# Patient Record
Sex: Female | Born: 1950 | Race: White | Hispanic: No | Marital: Married | Smoking: Former smoker
Health system: Southern US, Community
[De-identification: ages and names within clinical notes are randomized; demographics above are authoritative.]

## PROBLEM LIST (undated history)

## (undated) DIAGNOSIS — I4719 Other supraventricular tachycardia: Secondary | ICD-10-CM

## (undated) DIAGNOSIS — R0789 Other chest pain: Secondary | ICD-10-CM

## (undated) DIAGNOSIS — M8580 Other specified disorders of bone density and structure, unspecified site: Secondary | ICD-10-CM

## (undated) DIAGNOSIS — I480 Paroxysmal atrial fibrillation: Secondary | ICD-10-CM

## (undated) DIAGNOSIS — I447 Left bundle-branch block, unspecified: Secondary | ICD-10-CM

## (undated) DIAGNOSIS — I471 Supraventricular tachycardia: Secondary | ICD-10-CM

## (undated) DIAGNOSIS — B009 Herpesviral infection, unspecified: Secondary | ICD-10-CM

## (undated) DIAGNOSIS — E7800 Pure hypercholesterolemia, unspecified: Secondary | ICD-10-CM

## (undated) DIAGNOSIS — Z973 Presence of spectacles and contact lenses: Secondary | ICD-10-CM

## (undated) DIAGNOSIS — I10 Essential (primary) hypertension: Secondary | ICD-10-CM

## (undated) DIAGNOSIS — M1990 Unspecified osteoarthritis, unspecified site: Secondary | ICD-10-CM

## (undated) DIAGNOSIS — Z950 Presence of cardiac pacemaker: Secondary | ICD-10-CM

## (undated) HISTORY — DX: Pure hypercholesterolemia, unspecified: E78.00

## (undated) HISTORY — DX: Paroxysmal atrial fibrillation: I48.0

## (undated) HISTORY — DX: Presence of cardiac pacemaker: Z95.0

## (undated) HISTORY — DX: Left bundle-branch block, unspecified: I44.7

## (undated) HISTORY — PX: COLONOSCOPY: SHX174

## (undated) HISTORY — DX: Other supraventricular tachycardia: I47.19

## (undated) HISTORY — DX: Other specified disorders of bone density and structure, unspecified site: M85.80

## (undated) HISTORY — DX: Herpesviral infection, unspecified: B00.9

## (undated) HISTORY — DX: Other chest pain: R07.89

## (undated) HISTORY — DX: Supraventricular tachycardia: I47.1

---

## 1984-06-21 HISTORY — PX: TUBAL LIGATION: SHX77

## 1999-04-02 ENCOUNTER — Other Ambulatory Visit: Admission: RE | Admit: 1999-04-02 | Discharge: 1999-04-02 | Payer: Self-pay | Admitting: Obstetrics and Gynecology

## 2000-04-14 ENCOUNTER — Other Ambulatory Visit: Admission: RE | Admit: 2000-04-14 | Discharge: 2000-04-14 | Payer: Self-pay | Admitting: Obstetrics and Gynecology

## 2000-12-27 ENCOUNTER — Encounter: Payer: Self-pay | Admitting: Obstetrics and Gynecology

## 2000-12-27 ENCOUNTER — Ambulatory Visit (HOSPITAL_COMMUNITY): Admission: RE | Admit: 2000-12-27 | Discharge: 2000-12-27 | Payer: Self-pay | Admitting: Obstetrics and Gynecology

## 2001-05-31 ENCOUNTER — Other Ambulatory Visit: Admission: RE | Admit: 2001-05-31 | Discharge: 2001-05-31 | Payer: Self-pay | Admitting: Obstetrics and Gynecology

## 2002-01-03 ENCOUNTER — Ambulatory Visit (HOSPITAL_COMMUNITY): Admission: RE | Admit: 2002-01-03 | Discharge: 2002-01-03 | Payer: Self-pay | Admitting: Obstetrics and Gynecology

## 2002-01-03 ENCOUNTER — Encounter: Payer: Self-pay | Admitting: Obstetrics and Gynecology

## 2002-02-26 ENCOUNTER — Ambulatory Visit (HOSPITAL_COMMUNITY): Admission: RE | Admit: 2002-02-26 | Discharge: 2002-02-26 | Payer: Self-pay | Admitting: Internal Medicine

## 2002-06-05 ENCOUNTER — Other Ambulatory Visit: Admission: RE | Admit: 2002-06-05 | Discharge: 2002-06-05 | Payer: Self-pay | Admitting: Obstetrics and Gynecology

## 2003-01-09 ENCOUNTER — Ambulatory Visit (HOSPITAL_COMMUNITY): Admission: RE | Admit: 2003-01-09 | Discharge: 2003-01-09 | Payer: Self-pay | Admitting: Internal Medicine

## 2003-01-09 ENCOUNTER — Encounter: Payer: Self-pay | Admitting: Internal Medicine

## 2003-06-10 ENCOUNTER — Other Ambulatory Visit: Admission: RE | Admit: 2003-06-10 | Discharge: 2003-06-10 | Payer: Self-pay | Admitting: Obstetrics and Gynecology

## 2003-06-22 HISTORY — PX: CARDIAC CATHETERIZATION: SHX172

## 2003-08-21 ENCOUNTER — Ambulatory Visit (HOSPITAL_COMMUNITY): Admission: RE | Admit: 2003-08-21 | Discharge: 2003-08-21 | Payer: Self-pay | Admitting: Family Medicine

## 2003-08-27 ENCOUNTER — Ambulatory Visit (HOSPITAL_COMMUNITY): Admission: RE | Admit: 2003-08-27 | Discharge: 2003-08-27 | Payer: Self-pay | Admitting: Family Medicine

## 2003-08-27 HISTORY — PX: OTHER SURGICAL HISTORY: SHX169

## 2003-10-08 ENCOUNTER — Inpatient Hospital Stay (HOSPITAL_BASED_OUTPATIENT_CLINIC_OR_DEPARTMENT_OTHER): Admission: RE | Admit: 2003-10-08 | Discharge: 2003-10-08 | Payer: Self-pay | Admitting: *Deleted

## 2003-10-23 ENCOUNTER — Ambulatory Visit (HOSPITAL_COMMUNITY): Admission: RE | Admit: 2003-10-23 | Discharge: 2003-10-23 | Payer: Self-pay | Admitting: *Deleted

## 2004-01-15 ENCOUNTER — Ambulatory Visit (HOSPITAL_COMMUNITY): Admission: RE | Admit: 2004-01-15 | Discharge: 2004-01-15 | Payer: Self-pay | Admitting: Obstetrics and Gynecology

## 2004-06-11 ENCOUNTER — Other Ambulatory Visit: Admission: RE | Admit: 2004-06-11 | Discharge: 2004-06-11 | Payer: Self-pay | Admitting: Obstetrics and Gynecology

## 2004-09-24 ENCOUNTER — Ambulatory Visit (HOSPITAL_COMMUNITY): Admission: RE | Admit: 2004-09-24 | Discharge: 2004-09-24 | Payer: Self-pay | Admitting: Family Medicine

## 2005-01-27 ENCOUNTER — Ambulatory Visit (HOSPITAL_COMMUNITY): Admission: RE | Admit: 2005-01-27 | Discharge: 2005-01-27 | Payer: Self-pay | Admitting: Obstetrics and Gynecology

## 2005-06-16 ENCOUNTER — Other Ambulatory Visit: Admission: RE | Admit: 2005-06-16 | Discharge: 2005-06-16 | Payer: Self-pay | Admitting: Obstetrics and Gynecology

## 2006-01-31 ENCOUNTER — Ambulatory Visit (HOSPITAL_COMMUNITY): Admission: RE | Admit: 2006-01-31 | Discharge: 2006-01-31 | Payer: Self-pay | Admitting: Obstetrics and Gynecology

## 2006-06-17 ENCOUNTER — Other Ambulatory Visit: Admission: RE | Admit: 2006-06-17 | Discharge: 2006-06-17 | Payer: Self-pay | Admitting: Obstetrics and Gynecology

## 2006-09-14 ENCOUNTER — Ambulatory Visit (HOSPITAL_COMMUNITY): Admission: RE | Admit: 2006-09-14 | Discharge: 2006-09-14 | Payer: Self-pay | Admitting: Family Medicine

## 2006-12-27 HISTORY — PX: OTHER SURGICAL HISTORY: SHX169

## 2007-02-06 ENCOUNTER — Ambulatory Visit (HOSPITAL_COMMUNITY): Admission: RE | Admit: 2007-02-06 | Discharge: 2007-02-06 | Payer: Self-pay | Admitting: Obstetrics and Gynecology

## 2007-06-19 ENCOUNTER — Other Ambulatory Visit: Admission: RE | Admit: 2007-06-19 | Discharge: 2007-06-19 | Payer: Self-pay | Admitting: Obstetrics and Gynecology

## 2008-02-07 ENCOUNTER — Ambulatory Visit (HOSPITAL_COMMUNITY): Admission: RE | Admit: 2008-02-07 | Discharge: 2008-02-07 | Payer: Self-pay | Admitting: Obstetrics and Gynecology

## 2008-06-25 ENCOUNTER — Ambulatory Visit: Payer: Self-pay | Admitting: Obstetrics and Gynecology

## 2008-06-25 ENCOUNTER — Other Ambulatory Visit: Admission: RE | Admit: 2008-06-25 | Discharge: 2008-06-25 | Payer: Self-pay | Admitting: Obstetrics and Gynecology

## 2008-06-25 ENCOUNTER — Encounter: Payer: Self-pay | Admitting: Obstetrics and Gynecology

## 2009-02-10 ENCOUNTER — Ambulatory Visit (HOSPITAL_COMMUNITY): Admission: RE | Admit: 2009-02-10 | Discharge: 2009-02-10 | Payer: Self-pay | Admitting: Family Medicine

## 2009-07-04 ENCOUNTER — Other Ambulatory Visit: Admission: RE | Admit: 2009-07-04 | Discharge: 2009-07-04 | Payer: Self-pay | Admitting: Obstetrics and Gynecology

## 2009-07-04 ENCOUNTER — Ambulatory Visit: Payer: Self-pay | Admitting: Obstetrics and Gynecology

## 2009-07-11 ENCOUNTER — Ambulatory Visit: Payer: Self-pay | Admitting: Obstetrics and Gynecology

## 2009-07-22 HISTORY — PX: PACEMAKER INSERTION: SHX728

## 2009-07-28 ENCOUNTER — Ambulatory Visit (HOSPITAL_COMMUNITY): Admission: RE | Admit: 2009-07-28 | Discharge: 2009-07-28 | Payer: Self-pay | Admitting: Cardiovascular Disease

## 2009-07-29 ENCOUNTER — Ambulatory Visit (HOSPITAL_COMMUNITY): Admission: RE | Admit: 2009-07-29 | Discharge: 2009-07-30 | Payer: Self-pay | Admitting: Cardiology

## 2009-07-29 HISTORY — PX: INSERT / REPLACE / REMOVE PACEMAKER: SUR710

## 2009-12-30 ENCOUNTER — Ambulatory Visit (HOSPITAL_COMMUNITY): Admission: RE | Admit: 2009-12-30 | Discharge: 2009-12-30 | Payer: Self-pay | Admitting: Family Medicine

## 2010-02-17 ENCOUNTER — Ambulatory Visit (HOSPITAL_COMMUNITY): Admission: RE | Admit: 2010-02-17 | Discharge: 2010-02-17 | Payer: Self-pay | Admitting: Obstetrics and Gynecology

## 2010-09-08 ENCOUNTER — Encounter (INDEPENDENT_AMBULATORY_CARE_PROVIDER_SITE_OTHER): Payer: Commercial Indemnity | Admitting: Obstetrics and Gynecology

## 2010-09-08 ENCOUNTER — Other Ambulatory Visit: Payer: Self-pay | Admitting: Obstetrics and Gynecology

## 2010-09-08 ENCOUNTER — Other Ambulatory Visit (HOSPITAL_COMMUNITY)
Admission: RE | Admit: 2010-09-08 | Discharge: 2010-09-08 | Disposition: A | Discharge status: Home / Self Care | Payer: Commercial Indemnity | Source: Ambulatory Visit | Attending: Obstetrics and Gynecology | Admitting: Obstetrics and Gynecology

## 2010-09-08 DIAGNOSIS — Z124 Encounter for screening for malignant neoplasm of cervix: Secondary | ICD-10-CM | POA: Insufficient documentation

## 2010-09-08 DIAGNOSIS — Z01419 Encounter for gynecological examination (general) (routine) without abnormal findings: Secondary | ICD-10-CM

## 2010-11-06 NOTE — Cardiovascular Report (Signed)
NAME:  [name], [name]                           ACCOUNT NO.:  [id number]   MEDICAL RECORD NO.:  [id number]                   PATIENT TYPE:  OIB   LOCATION:  6501                                 FACILITY:  [facility]   PHYSICIAN:  Carole Binning, M.D. [facility]         DATE OF BIRTH:  [birth year]   DATE OF PROCEDURE:  10/08/2003  DATE OF DISCHARGE:  10/08/2003                              CARDIAC CATHETERIZATION   PROCEDURE PERFORMED:  Left heart catheterization with coronary angiography  and left ventriculography.   INDICATION:  [name] is a 60 year old woman with symptoms of chest pain  and palpitations.  She was recently evaluated by Dr. Dorethea Clan and was found to  have a new left bundle branch block on her EKG which had not been present on  her recent EKG.  Because of the development of new left bundle branch block  in the presence of chest pain, she was referred for cardiac catheterization  to rule out coronary artery disease.   CATHETERIZATION PROCEDURAL NOTE:  We initially placed a 4 French sheath in  the right femoral artery.  Left coronary angiography was performed with a 6  French JL-3.5 catheter.  The right coronary artery was a small nondominant  vessel with a high anterior takeoff.  Multiple 4 French catheters were  attempted to cannulate this artery.  However, they were unsuccessful.  We  therefore upsized to a 5 [place] sheath and we utilized a 5 [place] AL-1  catheter.  This did allow [place] to get close enough to the origin of the artery  to visualize this with contrast injection.  We then performed left  ventriculography with 5 French angled pigtail catheter.  Contrast was  Omnipaque.  There were no complications.   CATHETERIZATION RESULTS:   HEMODYNAMICS:  1. Left ventricular pressure 166/10.  2. Aortic pressure 150/78.  3. There is no aortic valve gradient.   LEFT VENTRICULOGRAM:  Wall motion is normal.  Ejection fraction is estimated  at greater than or equal to 60%.   CORONARY ARTERIOGRAPHY (LEFT DOMINANT):  Left main is normal.   Left anterior descending artery gives rise to a small first diagonal and a  normal size second diagonal.  The LAD is normal.  The distal LAD tapers to a  very small vessel.   Left circumflex is a large dominant vessel.  Circumflex gives rise to a  normal size ramus intermedius and a very large branching obtuse marginal  which supplies the entire lateral wall.  The distal circumflex ends as a  small posterior descending artery.  The left circumflex is normal.   Right coronary artery is a small nondominant vessel.  Nonselective  injections of the right coronary artery appeared to be angiographically  normal.   IMPRESSION:  1. Normal left ventricular systolic function.  2. No significant coronary artery disease identified.  Carole Binning, M.D. [facility]    MWP/MEDQ  D:  10/08/2003  T:  10/09/2003  Job:  [id number]   cc:   Vida Roller, M.D.  Fax: [phone]   Corrie Mckusick, M.D.  [address] [place]. [place]  [place]  [place] [address]  Fax: [phone]

## 2010-11-06 NOTE — Procedures (Signed)
NAME:  [name], [name]                           ACCOUNT NO.:  [id number]   MEDICAL RECORD NO.:  [id number]                   PATIENT TYPE:  OUT   LOCATION:  RAD                                  FACILITY:  [facility]   PHYSICIAN:  Vida Roller, M.D.                DATE OF BIRTH:  [birth year]   DATE OF PROCEDURE:  DATE OF DISCHARGE:                                  ECHOCARDIOGRAM   TAPE NUMBER:  LB 525, tape count 1971 through 2345.   INDICATION:  A 60 year old woman with chest pain.  Assessment for systolic  function.   TECHNICAL QUALITY:  Adequate.   2-D MEASUREMENTS:  1. The aorta is 26 mm.  2. The left atrium is 30 mm.  3. The septum is 11 mm.  4. The posterior wall is 11 mm.  5. The left ventricular systolic dimension is 39 mm.  6. Left ventricular systolic dimension is 29 mm.   2-D AND DOPPLER IMAGING:  1. The left ventricle is normal size with normal systolic function.  There     are no wall motion abnormalities seen.  Overall ejection fraction is     estimated at 55-60%.  2. The right ventricle is normal size with normal systolic function.  3. Both atria appear to be normal size.  There is no obvious atrial septal     defect.  4. The aortic valve is trileaflet, tricommisure with no evidence of stenosis     or regurgitation.  5. The mitral valve is morphologically unremarkable with trivial     insufficiency.  No stenosis is seen.  6. The tricuspid valve is morphologically unremarkable with mild     insufficiency.  No stenosis is seen.  7. The pulmonic valve is not well seen.  8. The ascending aorta is not well seen.  9. The inferior vena cava is normal size.  10.      The pericardial structures appear normal.      ___________________________________________                                            Vida Roller, M.D.   JH/MEDQ  D:  10/23/2003  T:  10/23/2003  Job:  [id number]

## 2011-02-04 ENCOUNTER — Other Ambulatory Visit: Payer: Self-pay | Admitting: Obstetrics and Gynecology

## 2011-02-04 DIAGNOSIS — Z139 Encounter for screening, unspecified: Secondary | ICD-10-CM

## 2011-02-25 ENCOUNTER — Ambulatory Visit (HOSPITAL_COMMUNITY)
Admission: RE | Admit: 2011-02-25 | Discharge: 2011-02-25 | Disposition: A | Discharge status: Home / Self Care | Payer: Commercial Indemnity | Source: Ambulatory Visit | Attending: Obstetrics and Gynecology | Admitting: Obstetrics and Gynecology

## 2011-02-25 DIAGNOSIS — Z139 Encounter for screening, unspecified: Secondary | ICD-10-CM

## 2011-02-25 DIAGNOSIS — Z1231 Encounter for screening mammogram for malignant neoplasm of breast: Secondary | ICD-10-CM | POA: Insufficient documentation

## 2011-04-28 ENCOUNTER — Encounter (HOSPITAL_BASED_OUTPATIENT_CLINIC_OR_DEPARTMENT_OTHER): Payer: Self-pay | Admitting: *Deleted

## 2011-04-29 ENCOUNTER — Other Ambulatory Visit: Payer: Self-pay | Admitting: Orthopedic Surgery

## 2011-04-30 ENCOUNTER — Encounter (HOSPITAL_BASED_OUTPATIENT_CLINIC_OR_DEPARTMENT_OTHER): Payer: Self-pay | Admitting: Anesthesiology

## 2011-04-30 ENCOUNTER — Encounter (HOSPITAL_BASED_OUTPATIENT_CLINIC_OR_DEPARTMENT_OTHER): Payer: Self-pay | Admitting: Orthopedic Surgery

## 2011-04-30 ENCOUNTER — Encounter (HOSPITAL_BASED_OUTPATIENT_CLINIC_OR_DEPARTMENT_OTHER)
Admission: RE | Disposition: A | Discharge status: Home / Self Care | Payer: Self-pay | Source: Ambulatory Visit | Attending: Orthopedic Surgery

## 2011-04-30 ENCOUNTER — Ambulatory Visit (HOSPITAL_BASED_OUTPATIENT_CLINIC_OR_DEPARTMENT_OTHER)
Admission: RE | Admit: 2011-04-30 | Discharge: 2011-04-30 | Disposition: A | Discharge status: Home / Self Care | Payer: Commercial Indemnity | Source: Ambulatory Visit | Attending: Orthopedic Surgery | Admitting: Orthopedic Surgery

## 2011-04-30 ENCOUNTER — Ambulatory Visit (HOSPITAL_BASED_OUTPATIENT_CLINIC_OR_DEPARTMENT_OTHER): Payer: Commercial Indemnity | Admitting: Anesthesiology

## 2011-04-30 DIAGNOSIS — M65839 Other synovitis and tenosynovitis, unspecified forearm: Secondary | ICD-10-CM | POA: Insufficient documentation

## 2011-04-30 DIAGNOSIS — M19049 Primary osteoarthritis, unspecified hand: Secondary | ICD-10-CM | POA: Insufficient documentation

## 2011-04-30 DIAGNOSIS — Z950 Presence of cardiac pacemaker: Secondary | ICD-10-CM | POA: Insufficient documentation

## 2011-04-30 DIAGNOSIS — G5600 Carpal tunnel syndrome, unspecified upper limb: Secondary | ICD-10-CM | POA: Insufficient documentation

## 2011-04-30 DIAGNOSIS — I2510 Atherosclerotic heart disease of native coronary artery without angina pectoris: Secondary | ICD-10-CM | POA: Insufficient documentation

## 2011-04-30 DIAGNOSIS — E119 Type 2 diabetes mellitus without complications: Secondary | ICD-10-CM | POA: Insufficient documentation

## 2011-04-30 DIAGNOSIS — I10 Essential (primary) hypertension: Secondary | ICD-10-CM | POA: Insufficient documentation

## 2011-04-30 HISTORY — PX: DORSAL COMPARTMENT RELEASE: SHX5039

## 2011-04-30 HISTORY — PX: CARPAL TUNNEL RELEASE: SHX101

## 2011-04-30 HISTORY — DX: Essential (primary) hypertension: I10

## 2011-04-30 HISTORY — DX: Unspecified osteoarthritis, unspecified site: M19.90

## 2011-04-30 LAB — POCT I-STAT, CHEM 8
Calcium, Ion: 1.13 mmol/L (ref 1.12–1.32)
Creatinine, Ser: 0.8 mg/dL (ref 0.50–1.10)
Glucose, Bld: 96 mg/dL (ref 70–99)
Hemoglobin: 13.9 g/dL (ref 12.0–15.0)
Sodium: 140 mEq/L (ref 135–145)
TCO2: 27 mmol/L (ref 0–100)

## 2011-04-30 SURGERY — CARPAL TUNNEL RELEASE
Anesthesia: General | Site: Hand | Laterality: Left | Wound class: Clean

## 2011-04-30 MED ORDER — ONDANSETRON HCL 4 MG/2ML IJ SOLN
INTRAMUSCULAR | Status: DC | PRN
  Administered 2011-04-30: 4 mg via INTRAVENOUS

## 2011-04-30 MED ORDER — METHYLPREDNISOLONE ACETATE 40 MG/ML IJ SUSP
INTRAMUSCULAR | Status: DC | PRN
  Administered 2011-04-30: 40 mg

## 2011-04-30 MED ORDER — DEXAMETHASONE SODIUM PHOSPHATE 4 MG/ML IJ SOLN
INTRAMUSCULAR | Status: DC | PRN
  Administered 2011-04-30: 10 mg via INTRAVENOUS

## 2011-04-30 MED ORDER — CEFAZOLIN SODIUM 1-5 GM-% IV SOLN: 1.0000 g | Freq: Once | INTRAVENOUS | Status: DC

## 2011-04-30 MED ORDER — MIDAZOLAM HCL 5 MG/5ML IJ SOLN
INTRAMUSCULAR | Status: DC | PRN
  Administered 2011-04-30 (×2): 1 mg via INTRAVENOUS

## 2011-04-30 MED ORDER — FENTANYL CITRATE 0.05 MG/ML IJ SOLN
INTRAMUSCULAR | Status: DC | PRN
  Administered 2011-04-30: 50 ug via INTRAVENOUS

## 2011-04-30 MED ORDER — LIDOCAINE HCL 2 % IJ SOLN
INTRAMUSCULAR | Status: DC | PRN
  Administered 2011-04-30: .5 mL

## 2011-04-30 MED ORDER — LACTATED RINGERS IV SOLN
INTRAVENOUS | Status: DC
  Administered 2011-04-30: 09:00:00 via INTRAVENOUS

## 2011-04-30 MED ORDER — PROPOFOL 10 MG/ML IV EMUL
INTRAVENOUS | Status: DC | PRN
  Administered 2011-04-30: 200 mg via INTRAVENOUS

## 2011-04-30 SURGICAL SUPPLY — 49 items
BANDAGE ADHESIVE 1X3 (GAUZE/BANDAGES/DRESSINGS) IMPLANT
BANDAGE ELASTIC 3 VELCRO ST LF (GAUZE/BANDAGES/DRESSINGS) IMPLANT
BANDAGE ELASTIC 4 VELCRO ST LF (GAUZE/BANDAGES/DRESSINGS) ×2 IMPLANT
BLADE MINI RND TIP GREEN BEAV (BLADE) IMPLANT
BLADE SURG 15 STRL LF DISP TIS (BLADE) ×1 IMPLANT
BLADE SURG 15 STRL SS (BLADE) ×2
BNDG CMPR 9X4 STRL LF SNTH (GAUZE/BANDAGES/DRESSINGS) ×1
BNDG ESMARK 4X9 LF (GAUZE/BANDAGES/DRESSINGS) ×2 IMPLANT
BRUSH SCRUB EZ PLAIN DRY (MISCELLANEOUS) ×2 IMPLANT
CLOTH BEACON ORANGE TIMEOUT ST (SAFETY) ×2 IMPLANT
CORDS BIPOLAR (ELECTRODE) IMPLANT
COVER MAYO STAND STRL (DRAPES) ×2 IMPLANT
COVER TABLE BACK 60X90 (DRAPES) ×2 IMPLANT
CUFF TOURNIQUET SINGLE 18IN (TOURNIQUET CUFF) ×2 IMPLANT
DECANTER SPIKE VIAL GLASS SM (MISCELLANEOUS) IMPLANT
DRAPE EXTREMITY T 121X128X90 (DRAPE) ×2 IMPLANT
DRAPE SURG 17X23 STRL (DRAPES) ×2 IMPLANT
DRSG TEGADERM 4X4.75 (GAUZE/BANDAGES/DRESSINGS) IMPLANT
GLOVE BIO SURGEON STRL SZ 6.5 (GLOVE) ×2 IMPLANT
GLOVE BIOGEL M STRL SZ7.5 (GLOVE) ×2 IMPLANT
GLOVE ECLIPSE 6.5 STRL STRAW (GLOVE) ×2 IMPLANT
GLOVE ORTHO TXT STRL SZ7.5 (GLOVE) ×2 IMPLANT
GOWN PREVENTION PLUS XLARGE (GOWN DISPOSABLE) ×2 IMPLANT
GOWN PREVENTION PLUS XXLARGE (GOWN DISPOSABLE) ×4 IMPLANT
NDL SAFETY ECLIPSE 18X1.5 (NEEDLE) ×1 IMPLANT
NEEDLE 27GAX1X1/2 (NEEDLE) ×4 IMPLANT
NEEDLE HYPO 18GX1.5 SHARP (NEEDLE) ×2
PACK BASIN DAY SURGERY FS (CUSTOM PROCEDURE TRAY) ×2 IMPLANT
PAD CAST 3X4 CTTN HI CHSV (CAST SUPPLIES) IMPLANT
PADDING CAST ABS 4INX4YD NS (CAST SUPPLIES) ×1
PADDING CAST ABS COTTON 4X4 ST (CAST SUPPLIES) ×1 IMPLANT
PADDING CAST COTTON 3X4 STRL (CAST SUPPLIES)
PADDING WEBRIL 3 STERILE (GAUZE/BANDAGES/DRESSINGS) ×2 IMPLANT
SLEEVE SCD COMPRESS KNEE MED (MISCELLANEOUS) IMPLANT
SPLINT PLASTER 3X15 (CAST SUPPLIES) ×2 IMPLANT
SPLINT PLASTER CAST XFAST 3X15 (CAST SUPPLIES) ×5 IMPLANT
SPLINT PLASTER XTRA FASTSET 3X (CAST SUPPLIES) ×5
SPONGE GAUZE 4X4 12PLY (GAUZE/BANDAGES/DRESSINGS) IMPLANT
SPONGE GAUZE 4X4 FOR O.R. (GAUZE/BANDAGES/DRESSINGS) ×2 IMPLANT
STOCKINETTE 4X48 STRL (DRAPES) ×2 IMPLANT
STRIP CLOSURE SKIN 1/2X4 (GAUZE/BANDAGES/DRESSINGS) ×2 IMPLANT
SUT PROLENE 3 0 PS 2 (SUTURE) ×2 IMPLANT
SUT VIC AB 4-0 P-3 18XBRD (SUTURE) IMPLANT
SUT VIC AB 4-0 P3 18 (SUTURE)
SYR 3ML 23GX1 SAFETY (SYRINGE) IMPLANT
SYR CONTROL 10ML LL (SYRINGE) ×4 IMPLANT
TRAY DSU PREP LF (CUSTOM PROCEDURE TRAY) ×2 IMPLANT
UNDERPAD 30X30 INCONTINENT (UNDERPADS AND DIAPERS) ×2 IMPLANT
WATER STERILE IRR 1000ML POUR (IV SOLUTION) ×2 IMPLANT

## 2011-04-30 NOTE — Brief Op Note (Signed)
04/30/2011  10:31 AM  PATIENT:  [name] Reason  60 y.o. 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, djd  POST-OPERATIVE DIAGNOSIS:  same  PROCEDURE:  Procedure(s): CARPAL TUNNEL RELEASE INJECTION OF LEFT THUMB CARPOMETACARPAL JOINT RELEASE DORSAL COMPARTMENT (DEQUERVAIN)  SURGEON:  Surgeon(s): Wyn Forster., MD  PHYSICIAN ASSISTANT:   ASSISTANTS:Kalvyn Desa [name],P.A.-C   ANESTHESIA:   general  EBL:     BLOOD ADMINISTERED:none  DRAINS: none   LOCAL MEDICATIONS USED:  XYLOCAINE 3 CC  SPECIMEN:  No Specimen  DISPOSITION OF SPECIMEN:  N/A  COUNTS:  YES  TOURNIQUET:  * Missing tourniquet times found for documented tourniquets in log:  6276 *  DICTATION: .Other Dictation: Dictation Number [id number]  PLAN OF CARE: Discharge to home after PACU  PATIENT DISPOSITION:  PACU - hemodynamically stable.   Delay start of Pharmacological VTE agent (>24hrs) due to surgical blood loss or risk of bleeding:  {YES/NO/NOT APPLICABLE:20182

## 2011-04-30 NOTE — H&P (Signed)
    RE: Tamera Reason     Seen by: Katy Fitch. Naaman Plummer., MD   OFFICE VISIT   02-24-11    DOB: [birth year]  Ardie Dragoo returns for follow up evaluation of her bilateral carpal tunnel syndrome and STS of the left first dorsal compartment. She has months of relief following injection of her left first dorsal compartment.  She now has recurrence discomfort and a positive Finkelstein's maneuver on the left. She has bilateral hand numbness that responds well to night splinting.   Once again we discussed the full spectrum of treatment options for carpal tunnel syndrome and deQuervain's STS. It would be reasonable for her to proceed with decompression of her carpal canals and release of the first dorsal compartment.   Due to family commitments, and child care of her grandchild, she would like to postpone surgery as long as possible. She requests an injection into her right ulnar bursa and left first dorsal compartment. After informed consent and alcohol Betadine prep, the right ulnar bursa was injected with 20 mg of Depo Medrol and 1.5 cc's of 1% Lidocaine. The left first dorsal compartment was injected with 20 mg of Depo Medrol and 1 cc of 1% Lidocaine without preservatives. I will see her back for follow up in approximately 3 months. She will continue splinting.  RVS/phe  T: 03-01-11 H&P documentation: 04/30/2011  -History and Physical Reviewed  -Patient has been re-examined  -No change in the plan of care  Fouad Taul JR,Maki Hege V

## 2011-04-30 NOTE — Transfer of Care (Signed)
Immediate Anesthesia Transfer of Care Note  Patient: [name]  Procedure(s) Performed:  CARPAL TUNNEL RELEASE - left carpal tunnel release, inject left thumb; RELEASE DORSAL COMPARTMENT (DEQUERVAIN) - release 1st dorsal compartment on left  Patient Location: PACU  Anesthesia Type: General  Level of Consciousness: awake, alert  and oriented  Airway & Oxygen Therapy: Patient Spontanous Breathing and Patient connected to face mask oxygen  Post-op Assessment: Report given to PACU RN  Post vital signs: stable  Complications: No apparent anesthesia complications

## 2011-04-30 NOTE — H&P (Signed)
[name] is an 60 y.o. female.   Chief Complaint: Patient presents complaining of progressive numbness and tingling of the left hand in addition  to symptoms of left first dorsal compartment stenosing tenosynovitis HPI: Patient is a 60 year old right hand dominant female who was seen most recently in our office on 02/24/2011 complaining of a several month history of left carpal tunnel symptoms in addition to severe tendinitis in the left first dorsal compartment .  Past Medical History  Diagnosis Date  . Hypertension   . Arthritis   . Diabetes mellitus     Past Surgical History  Procedure Date  . Tubal ligation   . Pacemaker insertion 2/11    dr Alanda Amass  . Colonoscopy     History reviewed. No pertinent family history. Social History:  reports that she has quit smoking. She has never used smokeless tobacco. She reports that she does not drink alcohol or use illicit drugs.  Allergies: No Known Allergies  Medications Prior to Admission  Medication Dose Route Frequency Provider Last Rate Last Dose  . ceFAZolin (ANCEF) IVPB 1 g/50 mL premix  1 g Intravenous Once       . lactated ringers infusion   Intravenous Continuous Bedelia Person, MD 20 mL/hr at 04/30/11 0915    . DISCONTD: lidocaine (XYLOCAINE) 2 % (with pres) injection    PRN Wyn Forster., MD   0.5 mL at 04/30/11 1030  . DISCONTD: methylPREDNISolone acetate (DEPO-MEDROL) injection    PRN Wyn Forster., MD   40 mg at 04/30/11 1032   Medications Prior to Admission  Medication Sig Dispense Refill  . acetaminophen (TYLENOL) 325 MG tablet Take 325 mg by mouth every 6 (six) hours as needed.        Marland Kitchen alendronate (FOSAMAX) 70 MG tablet Take 70 mg by mouth every 7 (seven) days. Take with a full glass of water on an empty stomach.       Marland Kitchen aspirin 81 MG chewable tablet Chew 81 mg by mouth daily.        . calcium carbonate (OS-CAL) 600 MG TABS Take 600 mg by mouth 2 (two) times daily with a meal.        . fish oil-omega-3  fatty acids 1000 MG capsule Take 2 g by mouth 3 x daily with food.        . linagliptin (TRADJENTA) 5 MG TABS tablet Take 5 mg by mouth every other day.        . lisinopril-hydrochlorothiazide (PRINZIDE,ZESTORETIC) 20-12.5 MG per tablet Take 1 tablet by mouth daily.        . metoprolol (TOPROL-XL) 50 MG 24 hr tablet Take 50 mg by mouth daily.        . Multiple Vitamin (MULTIVITAMIN) capsule Take 1 capsule by mouth daily.        . simvastatin (ZOCOR) 40 MG tablet Take 40 mg by mouth at bedtime.        Marland Kitchen venlafaxine (EFFEXOR) 75 MG tablet Take 75 mg by mouth daily.          Results for orders placed during the hospital encounter of 04/30/11 (from the past 48 hour(s))  POCT I-STAT, CHEM 8     Status: Abnormal   Collection Time   04/30/11  9:18 AM      Component Value Range Comment   Sodium 140  135 - 145 (mEq/L)    Potassium 3.7  3.5 - 5.1 (mEq/L)    Chloride 104  96 - 112 (mEq/L)    BUN 24 (*) 6 - 23 (mg/dL)    Creatinine, Ser 4.54  0.50 - 1.10 (mg/dL)    Glucose, Bld 96  70 - 99 (mg/dL)    Calcium, Ion 0.98  1.12 - 1.32 (mmol/L)    TCO2 27  0 - 100 (mmol/L)    Hemoglobin 13.9  12.0 - 15.0 (g/dL)    HCT 11.9  14.7 - 82.9 (%)     No results found.   Pertinent items are noted in HPI.  Blood pressure 126/76, pulse 69, temperature 97.6 F (36.4 C), temperature source Oral, resp. rate 17, height 5\' 1"  (1.549 m), weight 60.328 kg (133 lb), SpO2 99.00%.  General appearance: alert Head: Normocephalic, without obvious abnormality Neck: no adenopathy and supple, symmetrical, trachea midline Resp: clear to auscultation bilaterally Cardio: regular rate and rhythm, S1, S2 normal, no murmur, click, rub or gallop GI: normal findings: bowel sounds normal Extremities: Examination of her left hand revealed a positive Phalen's and Tinel's at her wrist she also had a positive Finkelstein's maneuver appeared she had full range of motion of her fingers. Nerve conduction studies reveal left carpal  tunnel syndrome with delayed onset latencies Pulses: 2+ and symmetric Skin: Skin color, texture, turgor normal. No rashes or lesions or mobility and turgor normal Neurologic: alert and oriented   Assessment/Plan Impression left carpal tunnel syndrome and deQuervains left wrist. Plan: Patient to be taken to the OR to undergo left carpal tunnel release and release of left first dorsal compartment  [name] 04/30/2011,

## 2011-04-30 NOTE — Progress Notes (Signed)
All intra-op data before 1000am was documented in error, please  See data artifacts for specific range

## 2011-04-30 NOTE — Anesthesia Procedure Notes (Addendum)
Performed by: Zenia Resides D    Procedure Name: MAC Date/Time: 04/30/2011 8:53 AM Performed by: Zenia Resides D Pre-anesthesia Checklist: Patient identified, Timeout performed, Suction available, Patient being monitored and Emergency Drugs available Patient Re-evaluated:Patient Re-evaluated prior to induction

## 2011-04-30 NOTE — Anesthesia Preprocedure Evaluation (Signed)
Anesthesia Evaluation  Patient identified by MRN, date of birth, ID band Patient awake    Airway Mallampati: I TM Distance: >3 FB Neck ROM: Full    Dental No notable dental hx. (+) Teeth Intact   Pulmonary          Cardiovascular hypertension, Pt. on medications + CAD + pacemaker Regular Normal    Neuro/Psych    GI/Hepatic   Endo/Other  Diabetes mellitus-, Well Controlled  Renal/GU      Musculoskeletal  (+) Fibromyalgia -  Abdominal Normal abdominal exam  (+)   Peds  Hematology   Anesthesia Other Findings   Reproductive/Obstetrics                           Anesthesia Physical Anesthesia Plan  ASA: III  Anesthesia Plan: General   Post-op Pain Management:    Induction: Intravenous  Airway Management Planned: LMA  Additional Equipment:   Intra-op Plan:   Post-operative Plan: Extubation in OR  Informed Consent: I have reviewed the patients History and Physical, chart, labs and discussed the procedure including the risks, benefits and alternatives for the proposed anesthesia with the patient or authorized representative who has indicated his/her understanding and acceptance.     Plan Discussed with: Surgeon and CRNA  Anesthesia Plan Comments:         Anesthesia Quick Evaluation

## 2011-04-30 NOTE — Op Note (Signed)
Dictated op note:  [id number] MRN: [id number] [facility] JR,Farron Watrous V

## 2011-04-30 NOTE — Anesthesia Postprocedure Evaluation (Signed)
  Anesthesia Post-op Note  Patient: [name]  Procedure(s) Performed:  CARPAL TUNNEL RELEASE - left carpal tunnel release, inject left thumb; RELEASE DORSAL COMPARTMENT (DEQUERVAIN) - release 1st dorsal compartment on left  Patient Location: PACU  Anesthesia Type: General  Level of Consciousness: awake, alert  and oriented  Airway and Oxygen Therapy: Patient Spontanous Breathing  Post-op Pain: mild  Post-op Assessment: Post-op Vital signs reviewed, Patient's Cardiovascular Status Stable, Respiratory Function Stable, RESPIRATORY FUNCTION UNSTABLE, No signs of Nausea or vomiting, Adequate PO intake and Pain level controlled  Post-op Vital Signs: stable  Complications: No apparent anesthesia complications

## 2011-05-01 NOTE — Op Note (Signed)
NAME:  [name], [name]                      ACCOUNT NO.:  MEDICAL RECORD NO.:  [id number]  LOCATION:                                 FACILITY:  PHYSICIAN:  Katy Fitch. Laurin Paulo, M.D. DATE OF BIRTH:  1950-11-18  DATE OF PROCEDURE:  04/30/2011 DATE OF DISCHARGE:                              OPERATIVE REPORT   PREOPERATIVE DIAGNOSES: 1. Entrapment neuropathy, left median nerve at carpal tunnel. 2. Chronic left thumb carpometacarpal arthritis. 3. 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  POSTOPERATIVE DIAGNOSES: 1. Entrapment neuropathy, left median nerve at carpal tunnel. 2. Chronic left thumb carpometacarpal arthritis. 3. 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  OPERATION: 1. Release of left transverse carpal ligament. 2. Release of left first dorsal compartment with inventory of abductor     pollicis longus tendons and failure to identify an extensor     pollicis brevis tendon. 3. Injection of left thumb carpometacarpal joint with Depo-Medrol and     lidocaine, 20 mg of Depo-Medrol and 1 mL of 2% plain lidocaine.  OPERATING SURGEON:  Katy Fitch. Caeli Linehan, MD  ASSISTANT:  [name] [name], PA-C  ANESTHESIA:  General by LMA.  SUPERVISING ANESTHESIOLOGIST:  Janetta Hora. Gelene Mink, MD  INDICATIONS:  [name] is a well-known patient who is referred from her primary care physician for evaluation and management of hand numbness and pain at the first dorsal compartment.  During her evaluation and management, we noted that she had significant left thumb CMC arthrosis.  We offered injection.  She requested that we perform the injection while under anesthesia to correct her carpal tunnel syndrome that was documented with significant electrodiagnostic study abnormalities.  We advised her to proceed with release of first dorsal compartment after failing injection and splinting.  Preoperatively, she was reminded of the potential risks and  benefits of surgery.  Benefits anticipated include relief of numbness, relief of pain at the first dorsal compartment, and relief of pain in her thumb carpometacarpal joint. Potential complications include infection, failure to relieve all her pain, possible neurovascular injury.  Questions regarding the anticipated procedure were invited and answered in detail.  Preoperatively, she was interviewed by Dr. Gelene Mink of Anesthesia.  He recommended proceeding with general anesthesia by LMA technique. Questions were invited and answered in detail.  In room 1 under Dr. Thornton Dales direct supervision, general anesthesia by LMA technique was induced.  The left upper extremity was then prepped with Betadine soap and solution and sterilely draped.  Following exsanguination of left arm with Esmarch bandage, the  arterial tourniquet on the proximal brachium was inflated to 250 mmHg.  A routine surgical time-out was accomplished.  We then proceeded to perform a short transverse incision directly over the palpably thickened first dorsal compartment.  Subcu tissues were meticulously dissected elevating the superficial sensory branches of the radial nerve off the compartment.  The compartment was invested with inflammatory synovium. After the compartment was clearly identified, it was split with scalpel and scissors.  We identified 2 slips of the abductor pollicis longus and diligently searched for an extensor pollicis brevis and did not find one.  There was not a separate  dorsal compartment containing an extensor pollicis brevis.  This is a recognized anatomic variant.  This wound was then repaired with intradermal 3-0 Prolene and Steri- Strips.  Attention was then directed to the palm where a short incision was fashioned at the distal margin of transverse carpal ligament in line of the ring finger.  Subcutaneous tissues were carefully divided within the palmar fascia.  This was split  longitudinally to reveal the common sensory branch of median nerve, which were carefully followed back to the median nerve proper with the aid of a Penfield 4 elevator.  A channel was created above the median nerve through the carpal tunnel with a Penfield 4 elevator followed by isolation of the transcarpal ligament.  The ligament released with scissors along its ulnar border extending into the distal forearm.  This widely opened the carpal canal.  No mass or particulates were noted.  Bleeding points were not problematic.  The wound was then repaired with intradermal 3-0 Prolene.  Attention was then directed to the left thumb CMC joint.  The joint line was identified by palpation.  The joint was placed under 10 pounds traction and a mixture of 20 mg of Depo-Medrol and 1 mL of 2% plain lidocaine was injected into the joint with a 27-gauge needle.  This was uncomplicated with good joint distention.  The wounds were then dressed with sterile gauze, sterile Webril, and a volar plaster splint maintaining the wrist in 10 degrees dorsiflexion. There were no apparent complications.  For aftercare, we will provide [name] with a prescription for Percocet 5 mg 1 p.o. q.4-6 h. p.r.n. pain, 20 tablets without refill.  We will see her back for followup in our office in 7-8 days for dressing change, suture removal, and initiation of postoperative rehabilitation program.     Katy Fitch. Montrell Cessna, M.D.     RVS/MEDQ  D:  04/30/2011  T:  04/30/2011  Job:  [id number]  cc:   Corrie Mckusick, M.D.

## 2011-05-06 ENCOUNTER — Encounter (HOSPITAL_BASED_OUTPATIENT_CLINIC_OR_DEPARTMENT_OTHER): Payer: Self-pay | Admitting: Orthopedic Surgery

## 2011-09-02 DIAGNOSIS — I10 Essential (primary) hypertension: Secondary | ICD-10-CM | POA: Insufficient documentation

## 2011-09-02 DIAGNOSIS — I447 Left bundle-branch block, unspecified: Secondary | ICD-10-CM | POA: Insufficient documentation

## 2011-09-02 DIAGNOSIS — M8580 Other specified disorders of bone density and structure, unspecified site: Secondary | ICD-10-CM | POA: Insufficient documentation

## 2011-09-10 ENCOUNTER — Ambulatory Visit (INDEPENDENT_AMBULATORY_CARE_PROVIDER_SITE_OTHER): Payer: Commercial Indemnity | Admitting: Obstetrics and Gynecology

## 2011-09-10 ENCOUNTER — Encounter: Payer: Self-pay | Admitting: Obstetrics and Gynecology

## 2011-09-10 VITALS — BP 124/76 | Ht 61.0 in | Wt 143.0 lb

## 2011-09-10 DIAGNOSIS — Z01419 Encounter for gynecological examination (general) (routine) without abnormal findings: Secondary | ICD-10-CM

## 2011-09-10 DIAGNOSIS — Z780 Asymptomatic menopausal state: Secondary | ICD-10-CM

## 2011-09-10 DIAGNOSIS — N951 Menopausal and female climacteric states: Secondary | ICD-10-CM

## 2011-09-10 DIAGNOSIS — E7800 Pure hypercholesterolemia, unspecified: Secondary | ICD-10-CM | POA: Insufficient documentation

## 2011-09-10 DIAGNOSIS — N952 Postmenopausal atrophic vaginitis: Secondary | ICD-10-CM | POA: Insufficient documentation

## 2011-09-10 LAB — URINALYSIS W MICROSCOPIC + REFLEX CULTURE
Bacteria, UA: NONE SEEN
Bilirubin Urine: NEGATIVE
Hgb urine dipstick: NEGATIVE
Ketones, ur: NEGATIVE mg/dL
Protein, ur: NEGATIVE mg/dL
RBC / HPF: NONE SEEN RBC/hpf (ref <3)
Urobilinogen, UA: 0.2 mg/dL (ref 0.0–1.0)

## 2011-09-10 MED ORDER — VENLAFAXINE HCL 75 MG PO TABS: 75.0000 mg | ORAL_TABLET | Freq: Every day | ORAL | Status: DC

## 2011-09-10 NOTE — Progress Notes (Signed)
The patient came to see me today for her annual GYN exam. She is using generic Effexor for vasomotor symptoms with excellent results. She uses Estrace cream for atrophic vaginitis with good results. She has no vaginal bleeding. She is having no pelvic pain. She is up-to-date on mammograms. She is on Fosamax through her PCP for bone loss and he is managing her bone densities.  Physical examination: Kennon Portela present. HEENT within normal limits. Neck: Thyroid not large. No masses. Supraclavicular nodes: not enlarged. Breasts: Examined in both sitting and lying position. No skin changes and no masses. Abdomen: Soft no guarding rebound or masses or hernia. Pelvic: External: Within normal limits. BUS: Within normal limits. Vaginal:within normal limits. Good estrogen effect. No evidence of cystocele rectocele or enterocele. Cervix: clean. Uterus: Normal size and shape. Adnexa: No masses. Rectovaginal exam: Confirmatory and negative. Extremities: Within normal limits.  Assessment: #1. Menopausal symptoms #2. Atrophic vaginitis #3. Low bone mass  Plan: Continue generic Effexor 75 mg. Patient does every other day with good results. Continue Estrace vaginal cream. Fosamax management through her PCP.

## 2012-01-27 ENCOUNTER — Other Ambulatory Visit: Payer: Self-pay | Admitting: Obstetrics and Gynecology

## 2012-01-27 DIAGNOSIS — Z139 Encounter for screening, unspecified: Secondary | ICD-10-CM

## 2012-02-22 ENCOUNTER — Telehealth: Payer: Self-pay | Admitting: *Deleted

## 2012-02-22 NOTE — Telephone Encounter (Signed)
[name] called to schedule her colonoscopy. Please call her back. Thanks.

## 2012-02-23 NOTE — Telephone Encounter (Signed)
Patient is on Tradjenta QOD for her diabetes. Do not take day of prep or AM of procedure.   Can use Moviprep.

## 2012-02-23 NOTE — Telephone Encounter (Signed)
Gastroenterology Pre-Procedure Form     Request Date: 02/23/2012      Requesting Physician: Pt received letter/ Her last one was 02/26/2002 by Dr. Karilyn Cota Pt aware he is not here/ requests Dr. Jena Gauss   Pt has pacemaker.    PATIENT INFORMATION:  [name] is a 61 y.o., female (DOB=11/16/1950).  PROCEDURE: Procedure(s) requested: colonoscopy Procedure Reason: screening for colon cancer  PATIENT REVIEW QUESTIONS: The patient reports the following:   1. Diabetes Melitis: no    ( Borderline    Uses diet only now) 2. Joint replacements in the past 12 months: no 3. Major health problems in the past 3 months: no 4. Has an artificial valve or MVP:no 5. Has been advised in past to take antibiotics in advance of a procedure like teeth cleaning: no}    MEDICATIONS & ALLERGIES:    Patient reports the following regarding taking any blood thinners:   Plavix? no Aspirin?yes  Coumadin?  no  Patient confirms/reports the following medications:  Current Outpatient Prescriptions  Medication Sig Dispense Refill  . acetaminophen (TYLENOL) 325 MG tablet Take 325 mg by mouth every 6 (six) hours as needed.        Marland Kitchen alendronate (FOSAMAX) 70 MG tablet Take 70 mg by mouth every 7 (seven) days. Take with a full glass of water on an empty stomach.       Marland Kitchen aspirin 81 MG chewable tablet Chew 81 mg by mouth daily.        . calcium carbonate (OS-CAL) 600 MG TABS Take 600 mg by mouth 2 (two) times daily with a meal.        . fish oil-omega-3 fatty acids 1000 MG capsule Take 1 g by mouth daily. Takes 500 mg bid      . ibuprofen (ADVIL,MOTRIN) 200 MG tablet Take 200 mg by mouth every 6 (six) hours as needed. Just occasional      . lisinopril-hydrochlorothiazide (PRINZIDE,ZESTORETIC) 20-12.5 MG per tablet Take 1 tablet by mouth daily.        Marland Kitchen Lysine 500 MG TABS Take by mouth. Pt said she takes one every other day      . metoprolol (TOPROL-XL) 50 MG 24 hr tablet Take 50 mg by mouth daily.        . Multiple Vitamin  (MULTIVITAMIN) capsule Take 1 capsule by mouth daily.        . simvastatin (ZOCOR) 40 MG tablet Take 40 mg by mouth at bedtime.        Marland Kitchen venlafaxine (EFFEXOR) 75 MG tablet Take 75 mg by mouth daily. Pt said she takes one tablet every other day      . DISCONTD: venlafaxine (EFFEXOR) 75 MG tablet Take 1 tablet (75 mg total) by mouth daily.  90 tablet  4  . estradiol (ESTRACE) 0.1 MG/GM vaginal cream Place 2 g vaginally daily.      Marland Kitchen linagliptin (TRADJENTA) 5 MG TABS tablet Take 5 mg by mouth every other day.          Patient confirms/reports the following allergies:  Allergies  Allergen Reactions  . Vicodin (Hydrocodone-Acetaminophen) Nausea Only    Patient is appropriate to schedule for requested procedure(s): yes  AUTHORIZATION INFORMATION Primary Insurance:   ID #:   Group #:  Pre-Cert / Auth required: Pre-Cert / Auth #:   Secondary Insurance:  ID #:   Group #:  Pre-Cert / Auth required:  Pre-Cert / Auth #:   No orders of the defined types were  placed in this encounter.    SCHEDULE INFORMATION: Procedure has been scheduled as follows:  Date:               Time:   Location:   This Gastroenterology Pre-Precedure Form is being routed to the following provider(s) for review: R. Roetta Sessions, MD

## 2012-02-23 NOTE — Telephone Encounter (Signed)
LMOM to call.

## 2012-02-25 NOTE — Telephone Encounter (Signed)
Waiting on October schedule.

## 2012-02-28 ENCOUNTER — Ambulatory Visit (HOSPITAL_COMMUNITY)
Admission: RE | Admit: 2012-02-28 | Discharge: 2012-02-28 | Disposition: A | Discharge status: Home / Self Care | Payer: Commercial Indemnity | Source: Ambulatory Visit | Attending: Obstetrics and Gynecology | Admitting: Obstetrics and Gynecology

## 2012-02-28 DIAGNOSIS — Z139 Encounter for screening, unspecified: Secondary | ICD-10-CM

## 2012-02-28 DIAGNOSIS — Z1231 Encounter for screening mammogram for malignant neoplasm of breast: Secondary | ICD-10-CM | POA: Insufficient documentation

## 2012-03-02 ENCOUNTER — Telehealth: Payer: Self-pay

## 2012-03-02 NOTE — Telephone Encounter (Signed)
[name] called to set up her appt with you. Please call her back. Thanks.

## 2012-03-02 NOTE — Telephone Encounter (Signed)
LMOM for pt to call. Need to get her on the Oct schedule.

## 2012-03-02 NOTE — Telephone Encounter (Signed)
I put her on the schedule for 04/14/2012 at 8:30 Am with RMR since she requested a Friday and that is only one in October. Have left VM for a return call. Will mail instructions and send Rx to pharmacy  ( which pharmacy), when I hear back from pt. She is also on my update triage list before procedure.

## 2012-03-06 NOTE — Telephone Encounter (Signed)
LMOM to call.

## 2012-03-06 NOTE — Telephone Encounter (Signed)
See Telephone note of 02/22/2012.

## 2012-03-08 ENCOUNTER — Other Ambulatory Visit: Payer: Self-pay

## 2012-03-08 DIAGNOSIS — Z139 Encounter for screening, unspecified: Secondary | ICD-10-CM

## 2012-03-08 NOTE — Telephone Encounter (Signed)
Instructions mailed to pt. Will Send Rx to pharmacy when pt returns call and lets me know which one.

## 2012-03-10 ENCOUNTER — Telehealth: Payer: Self-pay

## 2012-03-10 MED ORDER — PEG-KCL-NACL-NASULF-NA ASC-C 100 G PO SOLR: 1.0000 | ORAL | Status: DC

## 2012-03-10 NOTE — Telephone Encounter (Signed)
Pt is on my list to update triage prior to procedure.

## 2012-03-10 NOTE — Telephone Encounter (Signed)
Rx sent to pharmacy at [facility] Instructions to pt.

## 2012-03-10 NOTE — Telephone Encounter (Signed)
Mailed instructions and sending Rx to [facility]  (pharmacy on file).

## 2012-03-13 ENCOUNTER — Telehealth: Payer: Self-pay

## 2012-03-13 NOTE — Telephone Encounter (Addendum)
Gastroenterology Pre-Procedure Form  Pt does have a Pacemaker  Request Date: 03/13/2012    Requesting Physician: Received letter it was time to schedule next colonoscopy     PATIENT INFORMATION:  [name] is a 61 y.o., female (DOB=1950/08/18).  PROCEDURE: Procedure(s) requested: colonoscopy Procedure Reason: screening for colon cancer  PATIENT REVIEW QUESTIONS: The patient reports the following:   1. Diabetes Melitis: yes  DIET CONTROLLED/ NOT TAKEN TRAGENDA IN SEVERAL MONTHS 2. Joint replacements in the past 12 months: no 3. Major health problems in the past 3 months: no 4. Has an artificial valve or MVP:no 5. Has been advised in past to take antibiotics in advance of a procedure like teeth cleaning: no}    MEDICATIONS & ALLERGIES:    Patient reports the following regarding taking any blood thinners:   Plavix? no Aspirin?yes  Coumadin?  no  Patient confirms/reports the following medications:  Current Outpatient Prescriptions  Medication Sig Dispense Refill  . acetaminophen (TYLENOL) 325 MG tablet Take 325 mg by mouth every 6 (six) hours as needed.        Marland Kitchen alendronate (FOSAMAX) 70 MG tablet Take 70 mg by mouth every 7 (seven) days. Take with a full glass of water on an empty stomach.       Marland Kitchen aspirin 81 MG chewable tablet Chew 81 mg by mouth daily.        . calcium carbonate (OS-CAL) 600 MG TABS Take 600 mg by mouth 2 (two) times daily with a meal.        . ibuprofen (ADVIL,MOTRIN) 200 MG tablet Take 200 mg by mouth every 6 (six) hours as needed. Just occasional      . Krill Oil 500 MG CAPS Take by mouth. One tablet twice daily      . lisinopril-hydrochlorothiazide (PRINZIDE,ZESTORETIC) 20-12.5 MG per tablet Take 1 tablet by mouth daily.        Marland Kitchen Lysine 500 MG TABS Take by mouth. Pt said she takes one every other day      . metoprolol (TOPROL-XL) 50 MG 24 hr tablet Take 50 mg by mouth daily.        . Multiple Vitamin (MULTIVITAMIN) capsule Take 1 capsule by mouth daily.         . simvastatin (ZOCOR) 40 MG tablet Take 40 mg by mouth at bedtime.        Marland Kitchen venlafaxine (EFFEXOR) 75 MG tablet Take 75 mg by mouth daily. Pt said she takes one tablet every other day      . estradiol (ESTRACE) 0.1 MG/GM vaginal cream Place 2 g vaginally daily. Rarely      . fish oil-omega-3 fatty acids 1000 MG capsule Take 1 g by mouth daily. Takes 500 mg bid      . peg 3350 powder (MOVIPREP) 100 G SOLR Take 1 kit (100 g total) by mouth as directed.  1 kit  0    Patient confirms/reports the following allergies:  Allergies  Allergen Reactions  . Vicodin (Hydrocodone-Acetaminophen) Nausea Only    Patient is appropriate to schedule for requested procedure(s): yes  AUTHORIZATION INFORMATION Primary Insurance: ID #:   Group #:  Pre-Cert / Auth required:  Pre-Cert / Auth #:   Secondary Insurance:   ID #:  Group #:  Pre-Cert / Auth required:  Pre-Cert / Auth #:   No orders of the defined types were placed in this encounter.    SCHEDULE INFORMATION: Procedure has been scheduled as follows:  Date: 04/14/2012  Time:  8:30 AM       Location: [facility] Short Stay  This Gastroenterology Pre-Precedure Form is being routed to the following provider(s) for review: R. Roetta Sessions, MD

## 2012-03-13 NOTE — Telephone Encounter (Signed)
Pt returned call. She has not had any new problems and no change in medications. She is scheduled for colonoscopy on 04/14/2012.

## 2012-03-13 NOTE — Telephone Encounter (Signed)
Please update triage form to list diabetes & Tradjenta QOD for her diabetes. Do not take day of prep or AM of procedure. OK to proceed with colonoscopy  Thanks

## 2012-03-16 NOTE — Telephone Encounter (Signed)
Routing to KJ 

## 2012-03-16 NOTE — Telephone Encounter (Deleted)
Current Outpatient Prescriptions  Medication Sig Dispense Refill  . acetaminophen (TYLENOL) 325 MG tablet Take 325 mg by mouth every 6 (six) hours as needed.        Marland Kitchen alendronate (FOSAMAX) 70 MG tablet Take 70 mg by mouth every 7 (seven) days. Take with a full glass of water on an empty stomach.       Marland Kitchen aspirin 81 MG chewable tablet Chew 81 mg by mouth daily.        . calcium carbonate (OS-CAL) 600 MG TABS Take 600 mg by mouth 2 (two) times daily with a meal.        . ibuprofen (ADVIL,MOTRIN) 200 MG tablet Take 200 mg by mouth every 6 (six) hours as needed. Just occasional      . Krill Oil 500 MG CAPS Take by mouth. One tablet twice daily      . lisinopril-hydrochlorothiazide (PRINZIDE,ZESTORETIC) 20-12.5 MG per tablet Take 1 tablet by mouth daily.        Marland Kitchen Lysine 500 MG TABS Take by mouth. Pt said she takes one every other day      . metoprolol (TOPROL-XL) 50 MG 24 hr tablet Take 50 mg by mouth daily.        . Multiple Vitamin (MULTIVITAMIN) capsule Take 1 capsule by mouth daily.        . simvastatin (ZOCOR) 40 MG tablet Take 40 mg by mouth at bedtime.        Marland Kitchen venlafaxine (EFFEXOR) 75 MG tablet Take 75 mg by mouth daily. Pt said she takes one tablet every other day      . estradiol (ESTRACE) 0.1 MG/GM vaginal cream Place 2 g vaginally daily. Rarely      . fish oil-omega-3 fatty acids 1000 MG capsule Take 1 g by mouth daily. Takes 500 mg bid      . peg 3350 powder (MOVIPREP) 100 G SOLR Take 1 kit (100 g total) by mouth as directed.  1 kit  0  **Updated med list

## 2012-03-17 ENCOUNTER — Telehealth: Payer: Self-pay

## 2012-03-17 NOTE — Telephone Encounter (Signed)
Routing to Kandice Hohmann, NP.  

## 2012-03-17 NOTE — Telephone Encounter (Signed)
I called and spoke to [place] R. At [phone]. No precert required for the colonoscopy.

## 2012-03-17 NOTE — Telephone Encounter (Signed)
OK to proceed with colonoscopy.

## 2012-03-23 NOTE — Telephone Encounter (Signed)
Rx had been sent to pharmacy and instructions mailed to pt.

## 2012-04-03 ENCOUNTER — Telehealth: Payer: Self-pay

## 2012-04-03 NOTE — Telephone Encounter (Signed)
LMOM to call to update triage.  

## 2012-04-04 NOTE — Telephone Encounter (Signed)
OK to proceed with colonoscopy.

## 2012-04-04 NOTE — Telephone Encounter (Signed)
Pt returned call and left VM that there has not been any changes in medical history and no change in medications since she was triaged. She is scheduled for colonoscopy on 04/14/2012.

## 2012-04-12 ENCOUNTER — Encounter (HOSPITAL_COMMUNITY): Payer: Self-pay | Admitting: Pharmacy Technician

## 2012-04-14 ENCOUNTER — Encounter (HOSPITAL_COMMUNITY)
Admission: RE | Disposition: A | Discharge status: Home / Self Care | Payer: Self-pay | Source: Ambulatory Visit | Attending: Internal Medicine

## 2012-04-14 ENCOUNTER — Encounter (HOSPITAL_COMMUNITY): Payer: Self-pay | Admitting: *Deleted

## 2012-04-14 ENCOUNTER — Ambulatory Visit (HOSPITAL_COMMUNITY)
Admission: RE | Admit: 2012-04-14 | Discharge: 2012-04-14 | Disposition: A | Discharge status: Home / Self Care | Payer: Managed Care, Other (non HMO) | Source: Ambulatory Visit | Attending: Internal Medicine | Admitting: Internal Medicine

## 2012-04-14 DIAGNOSIS — I10 Essential (primary) hypertension: Secondary | ICD-10-CM | POA: Insufficient documentation

## 2012-04-14 DIAGNOSIS — Z1211 Encounter for screening for malignant neoplasm of colon: Secondary | ICD-10-CM

## 2012-04-14 DIAGNOSIS — Z139 Encounter for screening, unspecified: Secondary | ICD-10-CM

## 2012-04-14 HISTORY — PX: COLONOSCOPY: SHX5424

## 2012-04-14 SURGERY — COLONOSCOPY
Anesthesia: Moderate Sedation

## 2012-04-14 MED ORDER — STERILE WATER FOR IRRIGATION IR SOLN
Status: DC | PRN
  Administered 2012-04-14: 09:00:00

## 2012-04-14 MED ORDER — SODIUM CHLORIDE 0.45 % IV SOLN
INTRAVENOUS | Status: DC
  Administered 2012-04-14: 08:00:00 via INTRAVENOUS

## 2012-04-14 MED ORDER — MIDAZOLAM HCL 5 MG/5ML IJ SOLN
INTRAMUSCULAR | Status: DC | PRN
  Administered 2012-04-14: 1 mg via INTRAVENOUS
  Administered 2012-04-14: 2 mg via INTRAVENOUS
  Administered 2012-04-14 (×2): 1 mg via INTRAVENOUS

## 2012-04-14 MED ORDER — MIDAZOLAM HCL 5 MG/5ML IJ SOLN
INTRAMUSCULAR | Status: AC
  Filled 2012-04-14: qty 10

## 2012-04-14 MED ORDER — MEPERIDINE HCL 100 MG/ML IJ SOLN
INTRAMUSCULAR | Status: DC | PRN
  Administered 2012-04-14: 50 mg via INTRAVENOUS
  Administered 2012-04-14 (×2): 25 mg via INTRAVENOUS

## 2012-04-14 MED ORDER — MEPERIDINE HCL 100 MG/ML IJ SOLN
INTRAMUSCULAR | Status: AC
  Filled 2012-04-14: qty 2

## 2012-04-14 NOTE — Op Note (Signed)
[facility] [address] [place], [address]   COLONOSCOPY PROCEDURE REPORT  PATIENT: [name], [name]  MR#:         [id number] BIRTHDATE: 1951/03/07 , 61  yrs. old GENDER: Female ENDOSCOPIST: R.  Roetta Sessions, MD FACP FACG REFERRED BY:  Assunta Found, M.D. PROCEDURE DATE:  04/14/2012 PROCEDURE:     Screening colonoscopy  INDICATIONS: Average risk colorectal cancer screening  INFORMED CONSENT:  The risks, benefits, alternatives and imponderables including but not limited to bleeding, perforation as well as the possibility of a missed lesion have been reviewed.  The potential for biopsy, lesion removal, etc. have also been discussed.  Questions have been answered.  All parties agreeable. Please see the history and physical in the medical record for more information.  MEDICATIONS: Versed 5 mg IV and Demerol 100 mg IV in divided doses.  DESCRIPTION OF PROCEDURE:  After a digital rectal exam was performed, the Pentax Colonoscope [phone]  colonoscope was advanced from the anus through the rectum and colon to the area of the cecum, ileocecal valve and appendiceal orifice.  The cecum was deeply intubated.  These structures were well-seen and photographed for the record.  From the level of the cecum and ileocecal valve, the scope was slowly and cautiously withdrawn.  The mucosal surfaces were carefully surveyed utilizing scope tip deflection to facilitate fold flattening as needed.  The scope was pulled down into the rectum where a thorough examination including retroflexion was performed.    FINDINGS:  Good preparation. Normal rectum. Normal colon.  THERAPEUTIC / DIAGNOSTIC MANEUVERS PERFORMED:  None  COMPLICATIONS: None  CECAL WITHDRAWAL TIME:  10 minutes  IMPRESSION:  Normal rectum and colon  RECOMMENDATIONS: Repeat screening colonoscopy in 10 years   _______________________________ eSigned:  R. Roetta Sessions, MD FACP [facility] 04/14/2012 9:27  AM   CC:

## 2012-04-14 NOTE — H&P (Signed)
Primary Care Physician:  Colette Ribas, MD Primary Gastroenterologist:  Dr. Jena Gauss  Pre-Procedure History & Physical: HPI:  [name] is a 61 y.o. female is here for a screening colonoscopy. Last colonoscopy 10 years ago Dr. Andree Elk. No bowel symptoms currently. No family history of colon cancer  Past Medical History  Diagnosis Date  . Arthritis   . Hypertension   . Osteopenia   . Bundle branch block left     Dr. Alanda Amass is cardiologist  . Elevated cholesterol     Past Surgical History  Procedure Date  . Pacemaker insertion 07/2009    Dr. Alanda Amass  . Colonoscopy   . Carpal tunnel release 04/30/2011    Procedure: CARPAL TUNNEL RELEASE;  Surgeon: Wyn Forster., MD;  Location: [facility];  Service: Orthopedics;  Laterality: Left;  left carpal tunnel release, inject left thumb  . Dorsal compartment release 04/30/2011    Procedure: RELEASE DORSAL COMPARTMENT (DEQUERVAIN);  Surgeon: Wyn Forster., MD;  Location: [facility];  Service: Orthopedics;  Laterality: Left;  release 1st dorsal compartment on left  . Tubal ligation 1986  . Cardiac catheterization 2005  . Insert / replace / remove pacemaker     Prior to Admission medications   Medication Sig Start Date End Date Taking? Authorizing Provider  acetaminophen (TYLENOL) 325 MG tablet Take 325 mg by mouth every 6 (six) hours as needed. Pain.   Yes Historical Provider, MD  alendronate (FOSAMAX) 70 MG tablet Take 70 mg by mouth every 7 (seven) days. Take with a full glass of water on an empty stomach.    Yes Historical Provider, MD  aspirin 81 MG chewable tablet Chew 81 mg by mouth daily.     Yes Historical Provider, MD  calcium carbonate (OS-CAL) 600 MG TABS Take 600 mg by mouth 2 (two) times daily with a meal.     Yes Historical Provider, MD  Glucosamine-Chondroit-Vit C-Mn (GLUCOSAMINE 1500 COMPLEX PO) Take 1 tablet by mouth daily.   Yes Historical Provider, MD  ibuprofen  (ADVIL,MOTRIN) 200 MG tablet Take 200 mg by mouth every 6 (six) hours as needed. Just occasional   Yes Historical Provider, MD  Boris Lown Oil 500 MG CAPS Take 1 capsule by mouth 2 (two) times daily.    Yes Historical Provider, MD  lisinopril-hydrochlorothiazide (PRINZIDE,ZESTORETIC) 20-12.5 MG per tablet Take 1 tablet by mouth daily.     Yes Historical Provider, MD  Lysine 500 MG TABS Take 1 tablet by mouth daily.    Yes Historical Provider, MD  metoprolol (TOPROL-XL) 50 MG 24 hr tablet Take 50 mg by mouth daily.     Yes Historical Provider, MD  Multiple Vitamin (MULTIVITAMIN) capsule Take 1 capsule by mouth daily.     Yes Historical Provider, MD  oxymetazoline (AFRIN) 0.05 % nasal spray Place 2 sprays into the nose at bedtime.   Yes Historical Provider, MD  peg 3350 powder (MOVIPREP) 100 G SOLR Take 1 kit (100 g total) by mouth as directed. 03/10/12  Yes Corbin Ade, MD  simvastatin (ZOCOR) 40 MG tablet Take 40 mg by mouth at bedtime.     Yes Historical Provider, MD  venlafaxine (EFFEXOR) 75 MG tablet Take 75 mg by mouth every other day.  09/10/11  Yes Trellis Paganini, MD  estradiol (ESTRACE) 0.1 MG/GM vaginal cream Place 2 g vaginally daily. Rarely    Historical Provider, MD    Allergies as of 03/08/2012 - Review Complete 02/23/2012  Allergen  Reaction Noted  . Vicodin (hydrocodone-acetaminophen) Nausea Only 02/23/2012    Family History  Problem Relation Age of Onset  . Heart disease Mother   . Diabetes Father   . Heart disease Paternal Grandfather   . Colon cancer Maternal Aunt     History   Social History  . Marital Status: Married    Spouse Name: N/A    Number of Children: N/A  . Years of Education: N/A   Occupational History  . Not on file.   Social History Main Topics  . Smoking status: Former Smoker -- 1.5 packs/day for 30 years    Types: Cigarettes  . Smokeless tobacco: Never Used  . Alcohol Use: 4.2 oz/week    7 Glasses of wine per week  . Drug Use: No  . Sexually  Active: Yes    Birth Control/ Protection: Post-menopausal   Other Topics Concern  . Not on file   Social History Narrative  . No narrative on file    Review of Systems: See HPI, otherwise negative ROS  Physical Exam: BP 144/84  Pulse 80  Temp 98.2 F (36.8 C) (Oral)  Resp 20  Ht 5\' 1"  (1.549 m)  Wt 143 lb (64.864 kg)  BMI 27.02 kg/m2  SpO2 99% General:   Alert,  Well-developed, well-nourished, pleasant and cooperative in NAD Head:  Normocephalic and atraumatic. Eyes:  Sclera clear, no icterus.   Conjunctiva pink. Ears:  Normal auditory acuity. Nose:  No deformity, discharge,  or lesions. Mouth:  No deformity or lesions, dentition normal. Neck:  Supple; no masses or thyromegaly. Lungs:  Clear throughout to auscultation.   No wheezes, crackles, or rhonchi. No acute distress. Heart:  Regular rate and rhythm; no murmurs, clicks, rubs,  or gallops. Abdomen:  Soft, nontender and nondistended. No masses, hepatosplenomegaly or hernias noted. Normal bowel sounds, without guarding, and without rebound.   Msk:  Symmetrical without gross deformities. Normal posture. Pulses:  Normal pulses noted. Extremities:  Without clubbing or edema. Neurologic:  Alert and  oriented x4;  grossly normal neurologically. Skin:  Intact without significant lesions or rashes. Cervical Nodes:  No significant cervical adenopathy. Psych:  Alert and cooperative. Normal mood and affect.  Impression/Plan: [name] is now here to undergo a screening colonoscopy. Average risk screening examination.  Risks, benefits, limitations, imponderables and alternatives regarding colonoscopy have been reviewed with the patient. Questions have been answered. All parties agreeable.

## 2012-04-19 ENCOUNTER — Encounter (HOSPITAL_COMMUNITY): Payer: Self-pay | Admitting: Internal Medicine

## 2012-04-21 ENCOUNTER — Other Ambulatory Visit: Payer: Self-pay | Admitting: Orthopedic Surgery

## 2012-04-25 ENCOUNTER — Encounter (HOSPITAL_BASED_OUTPATIENT_CLINIC_OR_DEPARTMENT_OTHER): Payer: Self-pay | Admitting: *Deleted

## 2012-04-25 NOTE — Progress Notes (Signed)
Sent pace form to dr Alanda Amass in [place] office-he will there the rest of this week-sees pt in [place] office-got recent notes and ekg Pt here last yr for CTR Just had colonoscopy 10/13 Will need istat

## 2012-04-26 NOTE — H&P (Signed)
[name] is an 61 y.o. female.   Chief Complaint: c/o chronic and progressive numbness and tingling of the right hand  HPI: .  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  Recently she has had pain in the radial aspect of her left wrist aggravated by thumb motion and ulnar deviation of the wrist.  She has had numbness in both hands currently worse on the left than the right.  These symptoms have existed for more than 15 years.  She occasionally awakens at night with numbness.  She now seeks an upper extremity orthopaedic consult. She underwent left CTR and release left 1st DC in Nov. 2012 and did very well in the post-op period. She now wishes to proceed with right CTR.     Past Medical History  Diagnosis Date  . Arthritis   . Hypertension   . Osteopenia   . Bundle branch block left     Dr. Alanda Amass is cardiologist  . Elevated cholesterol   . Wears glasses     Past Surgical History  Procedure Date  . Pacemaker insertion 07/2009    Dr. Alanda Amass  . Colonoscopy   . Carpal tunnel release 04/30/2011    Procedure: CARPAL TUNNEL RELEASE;  Surgeon: Wyn Forster., MD;  Location: [facility];  Service: Orthopedics;  Laterality: Left;  left carpal tunnel release, inject left thumb  . Dorsal compartment release 04/30/2011    Procedure: RELEASE DORSAL COMPARTMENT (DEQUERVAIN);  Surgeon: Wyn Forster., MD;  Location: [facility];  Service: Orthopedics;  Laterality: Left;  release 1st dorsal compartment on left  . Tubal ligation 1986  . Cardiac catheterization 2005  . Insert / replace / remove pacemaker   . Colonoscopy 04/14/2012    Procedure: COLONOSCOPY;  Surgeon: Corbin Ade, MD;  [facility];  Service: Endoscopy;  Laterality: N/A;  8:30 AM    Family History  Problem Relation Age of Onset  . Heart disease Mother   . Diabetes Father   . Heart disease Paternal Grandfather   . Colon  cancer Maternal Aunt    Social History:  reports that she has quit smoking. Her smoking use included Cigarettes. She has a 45 pack-year smoking history. She has never used smokeless tobacco. She reports that she drinks about 4.2 ounces of alcohol per week. She reports that she does not use illicit drugs.  Allergies:  Allergies  Allergen Reactions  . Vicodin (Hydrocodone-Acetaminophen) Nausea Only    No prescriptions prior to admission    No results found for this or any previous visit (from the past 48 hour(s)).  No results found.   Pertinent items are noted in HPI.  Height 5\' 1"  (1.549 m), weight 64.864 kg (143 lb).  General appearance: alert Head: Normocephalic, without obvious abnormality Neck: supple, symmetrical, trachea midline Resp: clear to auscultation bilaterally Cardio: regular rate and rhythm GI: normal findings: bowel sounds normal Extremities:  She has positive wrist flexion test bilaterally at one minute and positive Tinel's sign on the left, negative on the right.  Her wrist flexion is 75 degrees right and 70 left, dorsiflexion 60 degrees right and 55 left, symmetrical radial and ulnar deviation.  There are mechanical neck symptoms with motion.    We asked Dr. Johna Roles to complete electrodiagnostic studies as clinically she appears to have carpal tunnel syndrome.  Detailed studies of the right and left hands reveals moderately severe right  carpal tunnel syndrome with diminishment of the sensory amplitude of 15.3 microvolts with a normal greater than 40 and moderate left carpal tunnel syndrome.  Her ulnar conduction velocities were normal.    Pulses: 2+ and symmetric Skin: normal Neurologic: Grossly normal    Assessment/Plan Impression: Right CTS  Plan: To the OR for right CTR.The procedure, risks,benefits and post-op course were discussed with the patient at length and they were in agreement with the plan.   [name],[name] 04/26/2012, 3:49 PM     H&P  documentation: 04/28/2012  -History and Physical Reviewed  -Patient has been re-examined  -No change in the plan of care  Wyn Forster, MD

## 2012-04-28 ENCOUNTER — Encounter (HOSPITAL_BASED_OUTPATIENT_CLINIC_OR_DEPARTMENT_OTHER)
Admission: RE | Disposition: A | Discharge status: Home / Self Care | Payer: Self-pay | Source: Ambulatory Visit | Attending: Orthopedic Surgery

## 2012-04-28 ENCOUNTER — Ambulatory Visit (HOSPITAL_BASED_OUTPATIENT_CLINIC_OR_DEPARTMENT_OTHER)
Admission: RE | Admit: 2012-04-28 | Discharge: 2012-04-28 | Disposition: A | Discharge status: Home / Self Care | Payer: Managed Care, Other (non HMO) | Source: Ambulatory Visit | Attending: Orthopedic Surgery | Admitting: Orthopedic Surgery

## 2012-04-28 ENCOUNTER — Ambulatory Visit (HOSPITAL_BASED_OUTPATIENT_CLINIC_OR_DEPARTMENT_OTHER): Payer: Managed Care, Other (non HMO) | Admitting: Certified Registered Nurse Anesthetist

## 2012-04-28 ENCOUNTER — Encounter (HOSPITAL_BASED_OUTPATIENT_CLINIC_OR_DEPARTMENT_OTHER): Payer: Self-pay | Admitting: *Deleted

## 2012-04-28 ENCOUNTER — Encounter (HOSPITAL_BASED_OUTPATIENT_CLINIC_OR_DEPARTMENT_OTHER): Payer: Self-pay | Admitting: Certified Registered Nurse Anesthetist

## 2012-04-28 DIAGNOSIS — M949 Disorder of cartilage, unspecified: Secondary | ICD-10-CM | POA: Insufficient documentation

## 2012-04-28 DIAGNOSIS — M899 Disorder of bone, unspecified: Secondary | ICD-10-CM | POA: Insufficient documentation

## 2012-04-28 DIAGNOSIS — I10 Essential (primary) hypertension: Secondary | ICD-10-CM | POA: Insufficient documentation

## 2012-04-28 DIAGNOSIS — E7800 Pure hypercholesterolemia, unspecified: Secondary | ICD-10-CM | POA: Insufficient documentation

## 2012-04-28 DIAGNOSIS — G5600 Carpal tunnel syndrome, unspecified upper limb: Secondary | ICD-10-CM | POA: Insufficient documentation

## 2012-04-28 DIAGNOSIS — M129 Arthropathy, unspecified: Secondary | ICD-10-CM | POA: Insufficient documentation

## 2012-04-28 HISTORY — DX: Presence of spectacles and contact lenses: Z97.3

## 2012-04-28 HISTORY — PX: CARPAL TUNNEL RELEASE: SHX101

## 2012-04-28 SURGERY — CARPAL TUNNEL RELEASE
Anesthesia: General | Site: Wrist | Laterality: Right | Wound class: Clean

## 2012-04-28 MED ORDER — EPHEDRINE SULFATE 50 MG/ML IJ SOLN
INTRAMUSCULAR | Status: DC | PRN
  Administered 2012-04-28: 10 mg via INTRAVENOUS

## 2012-04-28 MED ORDER — LIDOCAINE HCL 2 % IJ SOLN
INTRAMUSCULAR | Status: DC | PRN
  Administered 2012-04-28: 4 mL

## 2012-04-28 MED ORDER — LACTATED RINGERS IV SOLN
INTRAVENOUS | Status: DC
  Administered 2012-04-28 (×2): via INTRAVENOUS

## 2012-04-28 MED ORDER — FENTANYL CITRATE 0.05 MG/ML IJ SOLN
INTRAMUSCULAR | Status: DC | PRN
  Administered 2012-04-28 (×3): 25 ug via INTRAVENOUS
  Administered 2012-04-28: 50 ug via INTRAVENOUS

## 2012-04-28 MED ORDER — PROPOFOL 10 MG/ML IV BOLUS
INTRAVENOUS | Status: DC | PRN
  Administered 2012-04-28: 200 mg via INTRAVENOUS

## 2012-04-28 MED ORDER — ONDANSETRON HCL 4 MG/2ML IJ SOLN
INTRAMUSCULAR | Status: DC | PRN
  Administered 2012-04-28: 4 mg via INTRAVENOUS

## 2012-04-28 MED ORDER — DEXAMETHASONE SODIUM PHOSPHATE 10 MG/ML IJ SOLN
INTRAMUSCULAR | Status: DC | PRN
  Administered 2012-04-28: 10 mg via INTRAVENOUS

## 2012-04-28 MED ORDER — LIDOCAINE HCL (CARDIAC) 20 MG/ML IV SOLN
INTRAVENOUS | Status: DC | PRN
  Administered 2012-04-28: 60 mg via INTRAVENOUS

## 2012-04-28 MED ORDER — CHLORHEXIDINE GLUCONATE 4 % EX LIQD: 60.0000 mL | Freq: Once | CUTANEOUS | Status: DC

## 2012-04-28 MED ORDER — OXYCODONE-ACETAMINOPHEN 5-325 MG PO TABS: ORAL_TABLET | ORAL | Status: DC

## 2012-04-28 MED ORDER — MIDAZOLAM HCL 5 MG/5ML IJ SOLN
INTRAMUSCULAR | Status: DC | PRN
  Administered 2012-04-28: 1 mg via INTRAVENOUS

## 2012-04-28 SURGICAL SUPPLY — 37 items
BANDAGE ADHESIVE 1X3 (GAUZE/BANDAGES/DRESSINGS) IMPLANT
BANDAGE ELASTIC 3 VELCRO ST LF (GAUZE/BANDAGES/DRESSINGS) ×2 IMPLANT
BLADE SURG 15 STRL LF DISP TIS (BLADE) ×1 IMPLANT
BLADE SURG 15 STRL SS (BLADE) ×2
BNDG CMPR 9X4 STRL LF SNTH (GAUZE/BANDAGES/DRESSINGS) ×1
BNDG ESMARK 4X9 LF (GAUZE/BANDAGES/DRESSINGS) ×2 IMPLANT
BRUSH SCRUB EZ PLAIN DRY (MISCELLANEOUS) ×2 IMPLANT
CLOTH BEACON ORANGE TIMEOUT ST (SAFETY) ×2 IMPLANT
CORDS BIPOLAR (ELECTRODE) ×2 IMPLANT
COVER MAYO STAND STRL (DRAPES) ×2 IMPLANT
COVER TABLE BACK 60X90 (DRAPES) ×2 IMPLANT
CUFF TOURNIQUET SINGLE 18IN (TOURNIQUET CUFF) ×2 IMPLANT
DECANTER SPIKE VIAL GLASS SM (MISCELLANEOUS) ×2 IMPLANT
DRAPE EXTREMITY T 121X128X90 (DRAPE) ×2 IMPLANT
DRAPE SURG 17X23 STRL (DRAPES) ×2 IMPLANT
GLOVE BIOGEL M STRL SZ7.5 (GLOVE) ×2 IMPLANT
GLOVE ECLIPSE 6.5 STRL STRAW (GLOVE) ×4 IMPLANT
GLOVE ORTHO TXT STRL SZ7.5 (GLOVE) ×2 IMPLANT
GOWN PREVENTION PLUS XLARGE (GOWN DISPOSABLE) ×2 IMPLANT
GOWN PREVENTION PLUS XXLARGE (GOWN DISPOSABLE) ×4 IMPLANT
NEEDLE 27GAX1X1/2 (NEEDLE) ×2 IMPLANT
PACK BASIN DAY SURGERY FS (CUSTOM PROCEDURE TRAY) ×2 IMPLANT
PAD CAST 3X4 CTTN HI CHSV (CAST SUPPLIES) ×1 IMPLANT
PADDING CAST ABS 4INX4YD NS (CAST SUPPLIES) ×1
PADDING CAST ABS COTTON 4X4 ST (CAST SUPPLIES) ×1 IMPLANT
PADDING CAST COTTON 3X4 STRL (CAST SUPPLIES) ×2
SPLINT PLASTER CAST XFAST 3X15 (CAST SUPPLIES) ×5 IMPLANT
SPLINT PLASTER XTRA FASTSET 3X (CAST SUPPLIES) ×5
SPONGE GAUZE 4X4 12PLY (GAUZE/BANDAGES/DRESSINGS) ×2 IMPLANT
STOCKINETTE 4X48 STRL (DRAPES) ×2 IMPLANT
STRIP CLOSURE SKIN 1/2X4 (GAUZE/BANDAGES/DRESSINGS) ×2 IMPLANT
SUT PROLENE 3 0 PS 2 (SUTURE) ×2 IMPLANT
SYR 3ML 23GX1 SAFETY (SYRINGE) IMPLANT
SYR CONTROL 10ML LL (SYRINGE) ×2 IMPLANT
TRAY DSU PREP LF (CUSTOM PROCEDURE TRAY) ×2 IMPLANT
UNDERPAD 30X30 INCONTINENT (UNDERPADS AND DIAPERS) ×2 IMPLANT
WATER STERILE IRR 1000ML POUR (IV SOLUTION) IMPLANT

## 2012-04-28 NOTE — Anesthesia Procedure Notes (Signed)
Procedure Name: LMA Insertion Date/Time: 04/28/2012 8:54 AM Performed by: Magdeline Prange D Pre-anesthesia Checklist: Patient identified, Emergency Drugs available, Suction available and Patient being monitored Patient Re-evaluated:Patient Re-evaluated prior to inductionOxygen Delivery Method: Circle System Utilized Preoxygenation: Pre-oxygenation with 100% oxygen Intubation Type: IV induction Ventilation: Mask ventilation without difficulty LMA: LMA inserted LMA Size: 4.0 Number of attempts: 1 Airway Equipment and Method: bite block Placement Confirmation: positive ETCO2 Tube secured with: Tape Dental Injury: Teeth and Oropharynx as per pre-operative assessment

## 2012-04-28 NOTE — Op Note (Signed)
[id number] 

## 2012-04-28 NOTE — Anesthesia Postprocedure Evaluation (Signed)
  Anesthesia Post-op Note  Patient: [name]  Procedure(s) Performed: Procedure(s) (LRB) with comments: CARPAL TUNNEL RELEASE (Right)  Patient Location: PACU  Anesthesia Type:General  Level of Consciousness: awake, alert  and oriented  Airway and Oxygen Therapy: Patient Spontanous Breathing and Patient connected to face mask oxygen  Post-op Pain: mild  Post-op Assessment: Post-op Vital signs reviewed  Post-op Vital Signs: Reviewed  Complications: No apparent anesthesia complications

## 2012-04-28 NOTE — Brief Op Note (Signed)
04/28/2012  9:23 AM  PATIENT:  Tamera Reason  61 y.o. female  PRE-OPERATIVE DIAGNOSIS:  RIGHT CARPAL TUNNEL SYNDROME  POST-OPERATIVE DIAGNOSIS:  Right Carpal Tunnel Syndrome  PROCEDURE:  Procedure(s) (LRB) with comments: CARPAL TUNNEL RELEASE (Right)  SURGEON:  Surgeon(s) and Role:    * Wyn Forster., MD - Primary  PHYSICIAN ASSISTANT:   ASSISTANTS: Mallory Shirk.A-C   ANESTHESIA:   General by LMA  EBL:     BLOOD ADMINISTERED:none  DRAINS: none   LOCAL MEDICATIONS USED:  XYLOCAINE   SPECIMEN:  No Specimen  DISPOSITION OF SPECIMEN:  N/A  COUNTS:  YES  TOURNIQUET:   Total Tourniquet Time Documented: Upper Arm (Right) - 13 minutes  DICTATION: .Other Dictation: Dictation Number [id number]  PLAN OF CARE: Discharge to home after PACU  PATIENT DISPOSITION:  PACU - hemodynamically stable.

## 2012-04-28 NOTE — Transfer of Care (Signed)
Immediate Anesthesia Transfer of Care Note  Patient: [name]  Procedure(s) Performed: Procedure(s) (LRB) with comments: CARPAL TUNNEL RELEASE (Right)  Patient Location: PACU  Anesthesia Type:General  Level of Consciousness: awake and patient cooperative  Airway & Oxygen Therapy: Patient Spontanous Breathing and Patient connected to face mask oxygen  Post-op Assessment: Report given to PACU RN and Post -op Vital signs reviewed and stable  Post vital signs: Reviewed and stable  Complications: No apparent anesthesia complications

## 2012-04-28 NOTE — Anesthesia Preprocedure Evaluation (Addendum)
Anesthesia Evaluation  Patient identified by MRN, date of birth, ID band Patient awake    Reviewed: Allergy & Precautions, H&P , NPO status , Patient's Chart, lab work & pertinent test results, reviewed documented beta blocker date and time   Airway Mallampati: I TM Distance: >3 FB Neck ROM: Full    Dental  (+) Teeth Intact and Dental Advisory Given   Pulmonary  breath sounds clear to auscultation        Cardiovascular hypertension, Pt. on medications and Pt. on home beta blockers + pacemaker Rhythm:Regular     Neuro/Psych    GI/Hepatic   Endo/Other    Renal/GU      Musculoskeletal   Abdominal   Peds  Hematology   Anesthesia Other Findings   Reproductive/Obstetrics                          Anesthesia Physical Anesthesia Plan  ASA: II  Anesthesia Plan: General   Post-op Pain Management:    Induction: Intravenous  Airway Management Planned: LMA  Additional Equipment:   Intra-op Plan:   Post-operative Plan: Extubation in OR  Informed Consent: I have reviewed the patients History and Physical, chart, labs and discussed the procedure including the risks, benefits and alternatives for the proposed anesthesia with the patient or authorized representative who has indicated his/her understanding and acceptance.   Dental advisory given  Plan Discussed with: CRNA, Anesthesiologist and Surgeon  Anesthesia Plan Comments:         Anesthesia Quick Evaluation

## 2012-05-01 NOTE — Op Note (Signed)
NAMEMarland Santana  HILA, BOLDING                 ACCOUNT NO.:  [id number]  MEDICAL RECORD NO.:  [id number]  LOCATION:                                 FACILITY:  PHYSICIAN:  Katy Fitch. Suleima Ohlendorf, M.D. 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  OPERATION:  Release of right transverse carpal ligament.  OPERATING SURGEON:  Katy Fitch. Kai Calico, M.D.  ASSISTANT:  [name] [name], PA-C  ANESTHESIA:  General by LMA.  SUPERVISING ANESTHESIOLOGIST:  Sheldon Silvan, M.D.  INDICATIONS:  [name] is a 61 year old woman from [place] referred by Dr. Phillips Odor for evaluation and management of hand numbness and the de Quervain.  Her symptoms on the left had been resolved by treatment.  She now has chronic right carpal tunnel syndrome. Electrodiagnostic studies completed by Dr. Johna Roles revealed moderately severe right carpal tunnel syndrome.  After informed consent, she is brought to the operating room at this time.  PROCEDURE:  [name] was brought to room #1 of the [facility] and placed in supine position on the operating table.  Following induction of general anesthesia by LMA technique, the right arm was prepped with Betadine soap and solution, sterilely draped.  Following routine surgical time-out, the surgery commenced with a short incision measuring 15 mm in line of the ring finger in the palm.  Subcutaneous tissues were carefully divided revealing the palmar fascia.  Superficial palmar arch and the common sensory branch of the median nerve were identified and followed to the transverse carpal ligament.  The median nerve proper was separated from the transverse carpal ligament with a Insurance risk surveyor.  The transverse carpal  ligament was released with scissors towards the distal forearm.  Due to a large adipose tissue pad in the hypothenar eminence, we were unable to visualize the forearm through this small incision, therefore we converted to a near conventional 3-cm incision, which allowed retraction of the fat pad.  We then released the volar forearm fascia 6 cm above the distal wrist flexion crease.  This widely opened the carpal canal.  The median nerve and ulnar bursa was completely decompressed.  No masses or other predicaments were noted.  Bleeding points were electrocauterized along the margin of the released ligament followed by repair of the skin with intradermal 3-0 Prolene suture.  A compressive dressing was applied with a volar plaster splint maintaining the wrist in 15 degrees of dorsiflexion.  For aftercare, [name] is provided prescription for oxycodone and acetaminophen 5/325 1 p.o. q.4-6 hours p.r.n. pain, 20 tablets without refill.     Katy Fitch Fitzgerald Dunne, M.D.     RVS/MEDQ  D:  04/28/2012  T:  04/29/2012  Job:  [id number]  cc:   Corrie Mckusick, M.D.

## 2012-09-11 ENCOUNTER — Other Ambulatory Visit (HOSPITAL_COMMUNITY)
Admission: RE | Admit: 2012-09-11 | Discharge: 2012-09-11 | Disposition: A | Discharge status: Home / Self Care | Payer: Managed Care, Other (non HMO) | Source: Ambulatory Visit | Attending: Gynecology | Admitting: Gynecology

## 2012-09-11 ENCOUNTER — Ambulatory Visit (INDEPENDENT_AMBULATORY_CARE_PROVIDER_SITE_OTHER): Payer: Managed Care, Other (non HMO) | Admitting: Gynecology

## 2012-09-11 ENCOUNTER — Encounter: Payer: Self-pay | Admitting: Gynecology

## 2012-09-11 VITALS — BP 124/76 | Ht 61.0 in | Wt 139.0 lb

## 2012-09-11 DIAGNOSIS — Z01419 Encounter for gynecological examination (general) (routine) without abnormal findings: Secondary | ICD-10-CM

## 2012-09-11 DIAGNOSIS — Z1151 Encounter for screening for human papillomavirus (HPV): Secondary | ICD-10-CM | POA: Insufficient documentation

## 2012-09-11 DIAGNOSIS — N951 Menopausal and female climacteric states: Secondary | ICD-10-CM

## 2012-09-11 DIAGNOSIS — N952 Postmenopausal atrophic vaginitis: Secondary | ICD-10-CM

## 2012-09-11 MED ORDER — VENLAFAXINE HCL 75 MG PO TABS: 75.0000 mg | ORAL_TABLET | ORAL | Status: DC

## 2012-09-11 NOTE — Progress Notes (Signed)
[name] 11-Apr-1951 [id number]        62 y.o.  G2P2002 for annual exam.  Former patient of Dr. Eda Santana. Several issues noted below.  Past medical history,surgical history, medications, allergies, family history and social history were all reviewed and documented in the EPIC chart. ROS:  Was performed and pertinent positives and negatives are included in the history.  Exam: Kim assistant Filed Vitals:   09/11/12 1003  BP: 124/76  Height: 5\' 1"  (1.549 m)  Weight: 139 lb (63.05 kg)   General appearance  Normal Skin grossly normal Head/Neck normal with no cervical or supraclavicular adenopathy thyroid normal Lungs  clear Cardiac RR, without RMG Abdominal  soft, nontender, without masses, organomegaly or hernia Breasts  examined lying and sitting without masses, retractions, discharge or axillary adenopathy. Pelvic  Ext/BUS/vagina  normal with mild atrophic changes  Cervix  normal with stenotic external os noted  Uterus  anteverted, normal size, shape and contour, midline and mobile nontender   Adnexa  Without masses or tenderness    Anus and perineum  normal   Rectovaginal  normal sphincter tone without palpated masses or tenderness.    Assessment/Plan:  62 y.o. [name] female for annual exam.   1. Menopausal symptoms. Patient uses Effexor 75 mg every other day for hot flashes. Seems to work well for her and she wants to continue it I refilled her times a year. 2. Atrophic vaginitis. Occasionally uses Estrace cream per Dr. Eda Santana. Will go weeks without using it. Discussed issues of absorption and risks to include endometrial stimulation, increased thrombosis with stroke heart attack DVT and breast cancer issue. Patient has medication at home and requests no refill at this time. We'll continue to use it as needed sparingly. 3. Osteopenia. DEXA 12/2009 T score -1.5, FRAX not reported. Increase calcium vitamin D reviewed. Follows this with Dr. Phillips Odor. Suggested repeat DEXA now  and she will followup with him to arrange. 4. Pap smear 2012. Pap/HPV today. No history of abnormal Pap smears previously. Discussed current screening guidelines. Patient requested Pap smear this year. 5. Mammography 02/2012. Continue with annual mammography. SBE monthly reviewed. Colonoscopy 03/2012. Repeat at their recommended interval. 6. Health maintenance. No lab work done and she relates this is all done at her primary physician's office. Followup one year, sooner as needed.    [name], 10:29 AM 09/11/2012

## 2012-09-11 NOTE — Patient Instructions (Signed)
Followup in one year for annual exam. Recommend repeat bone density now as it has been several years since her last one. Recommend following up with Dr. Phillips Odor in reference to this.

## 2012-09-11 NOTE — Addendum Note (Signed)
Addended by: Dayna Barker on: 09/11/2012 10:49 AM   Modules accepted: Orders

## 2012-09-24 ENCOUNTER — Encounter: Payer: Self-pay | Admitting: Gynecology

## 2012-10-18 ENCOUNTER — Ambulatory Visit (HOSPITAL_COMMUNITY)
Admission: RE | Admit: 2012-10-18 | Discharge: 2012-10-18 | Disposition: A | Discharge status: Home / Self Care | Payer: Managed Care, Other (non HMO) | Source: Ambulatory Visit | Attending: Cardiovascular Disease | Admitting: Cardiovascular Disease

## 2012-10-18 DIAGNOSIS — I447 Left bundle-branch block, unspecified: Secondary | ICD-10-CM | POA: Insufficient documentation

## 2012-10-18 DIAGNOSIS — I10 Essential (primary) hypertension: Secondary | ICD-10-CM | POA: Insufficient documentation

## 2012-10-18 HISTORY — PX: TRANSTHORACIC ECHOCARDIOGRAM: SHX275

## 2012-10-18 NOTE — Progress Notes (Signed)
*  PRELIMINARY RESULTS* Echocardiogram 2D Echocardiogram has been performed.  Conrad [place] 10/18/2012, 9:38 AM

## 2013-01-30 ENCOUNTER — Other Ambulatory Visit (HOSPITAL_COMMUNITY): Payer: Self-pay | Admitting: Family Medicine

## 2013-01-30 DIAGNOSIS — Z139 Encounter for screening, unspecified: Secondary | ICD-10-CM

## 2013-02-20 LAB — PACEMAKER DEVICE OBSERVATION

## 2013-02-21 ENCOUNTER — Other Ambulatory Visit: Payer: Self-pay | Admitting: Cardiovascular Disease

## 2013-02-21 LAB — LIPID PANEL
HDL: 52 mg/dL (ref >39)
Total CHOL/HDL Ratio: 3.1 Ratio
VLDL: 26 mg/dL (ref 0–40)

## 2013-02-21 LAB — COMPREHENSIVE METABOLIC PANEL
AST: 21 U/L (ref 0–37)
BUN: 18 mg/dL (ref 6–23)
Calcium: 9.5 mg/dL (ref 8.4–10.5)
Chloride: 105 mEq/L (ref 96–112)
Creat: 0.9 mg/dL (ref 0.50–1.10)
Total Bilirubin: 0.5 mg/dL (ref 0.3–1.2)

## 2013-02-21 LAB — CBC WITH DIFFERENTIAL/PLATELET
Basophils Absolute: 0 10*3/uL (ref 0.0–0.1)
Basophils Relative: 1 % (ref 0–1)
Eosinophils Absolute: 0.3 10*3/uL (ref 0.0–0.7)
Eosinophils Relative: 8 % — ABNORMAL HIGH (ref 0–5)
HCT: 40.1 % (ref 36.0–46.0)
Lymphocytes Relative: 30 % (ref 12–46)
MCH: 32.8 pg (ref 26.0–34.0)
MCHC: 33.4 g/dL (ref 30.0–36.0)
MCV: 98 fL (ref 78.0–100.0)
Monocytes Absolute: 0.3 10*3/uL (ref 0.1–1.0)
Platelets: 214 10*3/uL (ref 150–400)
RDW: 13 % (ref 11.5–15.5)

## 2013-02-27 ENCOUNTER — Encounter: Payer: Self-pay | Admitting: Cardiovascular Disease

## 2013-03-01 ENCOUNTER — Ambulatory Visit (HOSPITAL_COMMUNITY)
Admission: RE | Admit: 2013-03-01 | Discharge: 2013-03-01 | Disposition: A | Discharge status: Home / Self Care | Payer: Managed Care, Other (non HMO) | Source: Ambulatory Visit | Attending: Family Medicine | Admitting: Family Medicine

## 2013-03-01 DIAGNOSIS — Z139 Encounter for screening, unspecified: Secondary | ICD-10-CM

## 2013-03-01 DIAGNOSIS — Z1231 Encounter for screening mammogram for malignant neoplasm of breast: Secondary | ICD-10-CM | POA: Insufficient documentation

## 2013-04-26 ENCOUNTER — Other Ambulatory Visit: Payer: Self-pay

## 2013-05-30 ENCOUNTER — Ambulatory Visit: Payer: Managed Care, Other (non HMO)

## 2013-05-30 ENCOUNTER — Ambulatory Visit (INDEPENDENT_AMBULATORY_CARE_PROVIDER_SITE_OTHER): Payer: Managed Care, Other (non HMO) | Admitting: *Deleted

## 2013-05-30 DIAGNOSIS — I498 Other specified cardiac arrhythmias: Secondary | ICD-10-CM

## 2013-06-04 LAB — PACEMAKER DEVICE OBSERVATION

## 2013-06-26 LAB — MDC_IDC_ENUM_SESS_TYPE_REMOTE
Battery Impedance: 206 Ohm
Battery Remaining Longevity: 121 mo
Battery Voltage: 2.8 V
Brady Statistic AP VP Percent: 8 %
Brady Statistic AP VS Percent: 0 %
Brady Statistic AS VP Percent: 92 %
Brady Statistic AS VS Percent: 0 %
Date Time Interrogation Session: 20141215160109
Lead Channel Impedance Value: 495 Ohm
Lead Channel Impedance Value: 498 Ohm
Lead Channel Pacing Threshold Amplitude: 0.375 V
Lead Channel Pacing Threshold Amplitude: 0.75 V
Lead Channel Pacing Threshold Pulse Width: 0.4 ms
Lead Channel Pacing Threshold Pulse Width: 0.4 ms
Lead Channel Sensing Intrinsic Amplitude: 2.8 mV
Lead Channel Setting Pacing Amplitude: 1.5 V
Lead Channel Setting Pacing Amplitude: 2.25 V
Lead Channel Setting Pacing Pulse Width: 0.4 ms
Lead Channel Setting Sensing Sensitivity: 2.8 mV

## 2013-07-08 ENCOUNTER — Encounter: Payer: Self-pay | Admitting: *Deleted

## 2013-08-20 ENCOUNTER — Encounter: Payer: Self-pay | Admitting: Physician Assistant

## 2013-08-20 ENCOUNTER — Ambulatory Visit (INDEPENDENT_AMBULATORY_CARE_PROVIDER_SITE_OTHER): Payer: Managed Care, Other (non HMO) | Admitting: Physician Assistant

## 2013-08-20 VITALS — BP 128/90 | HR 65 | Ht 61.0 in | Wt 135.0 lb

## 2013-08-20 DIAGNOSIS — I10 Essential (primary) hypertension: Secondary | ICD-10-CM

## 2013-08-20 DIAGNOSIS — Z950 Presence of cardiac pacemaker: Secondary | ICD-10-CM | POA: Insufficient documentation

## 2013-08-20 DIAGNOSIS — R079 Chest pain, unspecified: Secondary | ICD-10-CM | POA: Insufficient documentation

## 2013-08-20 NOTE — Assessment & Plan Note (Signed)
Patient's pain seems atypical and more in line with musculoskeletal. It is however waking her from sleep in the middle of the night.  We'll order a treadmill Myoview. He had a heart catheterization back in 2005 which showed no significant coronary disease.

## 2013-08-20 NOTE — Assessment & Plan Note (Signed)
Blood pressure well controlled

## 2013-08-20 NOTE — Progress Notes (Signed)
Date:  08/20/2013   ID:  [name], DOB September 29, 1950, MRN [id number]  PCP:  Purvis Kilts, MD  Primary Cardiologist:  [facility]    History of Present Illness: [name] is a 63 y.o. 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  She'll catheterization in 2005 which showed of significant coronary disease and normal LV function.  Patient reports this past Tuesday she had some right scapular pain which radiated around to the right side of her chest and woke her from sleep between one and 4 AM.  It seems to occur frequently at night and recurred again on Friday. Tylenol seemed to help. Ambulation and activity seems to improve it. She also notices it was worse with inspiration. Her family history significant for diabetes and heart failure her father in HOCM and carotid disease in her mom. Her mother did pass away recently in November of 2014 patient has been under a lot of stress going through her mother's belongings as she is the executor of her state.  The patient currently denies nausea, vomiting, fever, shortness of breath, orthopnea, dizziness, PND, cough, congestion, abdominal pain, hematochezia, melena, lower extremity edema, claudication.  Wt Readings from Last 3 Encounters:  08/20/13 135 lb (61.236 kg)  09/11/12 139 lb (63.05 kg)  04/25/12 143 lb (64.864 kg)     Past Medical History  Diagnosis Date  . Arthritis   . Hypertension   . Osteopenia   . Bundle branch block left     Dr. Rollene Fare is cardiologist  . Elevated cholesterol   . Wears glasses     Current Outpatient Prescriptions  Medication Sig Dispense Refill  . acetaminophen (TYLENOL) 325 MG tablet Take 325 mg by mouth every 6 (six) hours as needed. Pain.      Marland Kitchen alendronate (FOSAMAX) 70 MG tablet Take 70 mg by mouth every 7 (seven) days. Take with a full glass of water on an empty stomach.       Marland Kitchen  aspirin 81 MG chewable tablet Chew 81 mg by mouth daily.        . calcium carbonate (OS-CAL) 600 MG TABS Take 600 mg by mouth 2 (two) times daily with a meal.        . Glucosamine-Chondroit-Vit C-Mn (GLUCOSAMINE 1500 COMPLEX PO) Take 1 tablet by mouth daily.      Marland Kitchen ibuprofen (ADVIL,MOTRIN) 200 MG tablet Take 200 mg by mouth every 6 (six) hours as needed. Just occasional      . Krill Oil 500 MG CAPS Take 1 capsule by mouth 2 (two) times daily.       Marland Kitchen lisinopril-hydrochlorothiazide (PRINZIDE,ZESTORETIC) 20-12.5 MG per tablet Take 1 tablet by mouth daily.        Marland Kitchen Lysine 500 MG TABS Take 1 tablet by mouth daily.       . metoprolol (TOPROL-XL) 50 MG 24 hr tablet Take 50 mg by mouth daily.        . Multiple Vitamin (MULTIVITAMIN) capsule Take 1 capsule by mouth daily.        Marland Kitchen oxymetazoline (AFRIN) 0.05 % nasal spray Place 2 sprays into the nose at bedtime.      . simvastatin (ZOCOR) 40 MG tablet Take 40 mg by mouth at bedtime.        Marland Kitchen venlafaxine (EFFEXOR) 75 MG tablet Take 1 tablet (75 mg total) by mouth every other day.  30 tablet  11  No current facility-administered medications for this visit.    Allergies:    Allergies  Allergen Reactions  . Vicodin [Hydrocodone-Acetaminophen] Nausea Only    Social History:  The patient  reports that she has quit smoking. Her smoking use included Cigarettes. She has a 45 pack-year smoking history. She has never used smokeless tobacco. She reports that she drinks about 4.2 ounces of alcohol per week. She reports that she does not use illicit drugs.   Family history:   Family History  Problem Relation Age of Onset  . Heart disease Mother   . Diabetes Father   . Heart disease Paternal Grandfather   . Colon cancer Maternal Aunt     ROS:  Please see the history of present illness.  All other systems reviewed and negative.   PHYSICAL EXAM: VS:  BP 128/90  Pulse 65  Ht 5\' 1"  (1.549 m)  Wt 135 lb (61.236 kg)  BMI 25.52 kg/m2 Well nourished, well  developed, in no acute distress HEENT: Pupils are equal round react to light accommodation extraocular movements are intact.  Neck: no JVDNo cervical lymphadenopathy. Cardiac: Regular rate and rhythm without murmurs rubs or gallops. Lungs:  clear to auscultation bilaterally, no wheezing, rhonchi or rales Abd: soft, nontender, positive bowel sounds all quadrants, no hepatosplenomegaly Ext: no lower extremity edema.  2+ radial and dorsalis pedis pulses. MS:  No scapular or chest pain elicited with active or passive movement of the right arm. Skin: warm and dry Neuro:  Grossly normal  EKG:  Ventricular paced 65 beats per min    ASSESSMENT AND PLAN:  Problem List Items Addressed This Visit   Hypertension     Blood pressure well controlled    Chest pain - Primary     Patient's pain seems atypical and more in line with musculoskeletal. It is however waking her from sleep in the middle of the night.  We'll order a treadmill Myoview. He had a heart catheterization back in 2005 which showed no significant coronary disease.    Relevant Orders      EKG 12-Lead      Myocardial Perfusion Imaging   History of permanent cardiac pacemaker placement

## 2013-08-20 NOTE — Patient Instructions (Signed)
1.  You will be scheduled for a treadmill nuclear stress test. 2.  Follow up with Dr. Sallyanne Kuster in six months or sooner depending on the results of the test.

## 2013-08-27 ENCOUNTER — Telehealth (HOSPITAL_COMMUNITY): Payer: Self-pay | Admitting: *Deleted

## 2013-08-29 ENCOUNTER — Telehealth (HOSPITAL_COMMUNITY): Payer: Self-pay

## 2013-08-30 ENCOUNTER — Ambulatory Visit (HOSPITAL_COMMUNITY)
Admission: RE | Admit: 2013-08-30 | Discharge: 2013-08-30 | Disposition: A | Discharge status: Home / Self Care | Payer: Managed Care, Other (non HMO) | Source: Ambulatory Visit | Attending: Cardiovascular Disease | Admitting: Cardiovascular Disease

## 2013-08-30 DIAGNOSIS — R079 Chest pain, unspecified: Secondary | ICD-10-CM

## 2013-08-30 MED ORDER — TECHNETIUM TC 99M SESTAMIBI GENERIC - CARDIOLITE
10.3000 | Freq: Once | INTRAVENOUS | Status: AC | PRN
  Administered 2013-08-30: 10 via INTRAVENOUS

## 2013-08-30 MED ORDER — AMINOPHYLLINE 25 MG/ML IV SOLN
75.0000 mg | Freq: Once | INTRAVENOUS | Status: AC
  Administered 2013-08-30: 75 mg via INTRAVENOUS

## 2013-08-30 MED ORDER — TECHNETIUM TC 99M SESTAMIBI GENERIC - CARDIOLITE
30.6000 | Freq: Once | INTRAVENOUS | Status: AC | PRN
  Administered 2013-08-30: 31 via INTRAVENOUS

## 2013-08-30 MED ORDER — REGADENOSON 0.4 MG/5ML IV SOLN
0.4000 mg | Freq: Once | INTRAVENOUS | Status: AC
  Administered 2013-08-30: 0.4 mg via INTRAVENOUS

## 2013-08-30 NOTE — Procedures (Addendum)
[facility] [address] [place] [id number] [phone]  Cardiology Nuclear Med Study  [name] is a 63 y.o. female     MRN : [id number]     DOB: September 28, 1950  Procedure Date: 08/30/2013  Nuclear Med Background Indication for Stress Test:  Evaluation for Ischemia History:  CAD;PACER Cardiac Risk Factors: Family History - CAD, History of Smoking, Hypertension and Lipids  Symptoms:  Chest Pain, Dizziness, DOE and Fatigue   Nuclear Pre-Procedure Caffeine/Decaff Intake:  8:00pm NPO After: 6:00am   IV Site: R Forearm  IV 0.9% NS with Angio Cath:  22g  Chest Size (in):  N/A IV Started by: Azucena Cecil, RN  Height: 5\' 1"  (1.549 m)  Cup Size: b  BMI:  Body mass index is 25.52 kg/(m^2). Weight:  135 lb (61.236 kg)   Tech Comments:  Exercise stress test was changed to a [employer] due patient not being able to achieve her target HR. She was experiencing extreme leg fatigue.    Nuclear Med Study 1 or 2 day study: 1 day  Stress Test Type:Lexiscan    Order Authorizing Provider:  Sanda Klein, MD   Resting Radionuclide: Technetium 36m Sestamibi  Resting Radionuclide Dose: 10.3 mCi   Stress Radionuclide:  Technetium 7m Sestamibi  Stress Radionuclide Dose: 30.6 mCi           Stress Protocol Rest HR: 80 Stress HR: 117  Rest BP: 125/79 Stress BP: 131/81  Exercise Time (min): n/a METS: n/a          Dose of Adenosine (mg):  n/a Dose of Lexiscan: 0.4 mg  Dose of Atropine (mg): n/a Dose of Dobutamine: n/a mcg/kg/min (at max HR)  Stress Test Technologist: Mellody Memos, CCT Nuclear Technologist: Imagene Riches, CNMT   Rest Procedure:  Myocardial perfusion imaging was performed at rest 45 minutes following the intravenous administration of Technetium 28m Sestamibi. Stress Procedure:  The patient received IV Lexiscan 0.4 mg over 15-seconds.  Technetium 15m Sestamibi injected at 30-seconds.  Due to patient's shortness  of breath and light-headedness, she was given IV Aminophylline 75 mg. Symptoms were resolved during recovery. There were no significant changes with Lexiscan.  Quantitative spect images were obtained after a 45 minute delay.  Transient Ischemic Dilatation (Normal <1.22):  0.84 Lung/Heart Ratio (Normal <0.45):  0.24 QGS EDV:  64 ml QGS ESV:  19 ml LV Ejection Fraction: 70%        Rest ECG: V paced rhythm  Stress ECG: No significant change from baseline ECG  QPS Raw Data Images:  Normal; no motion artifact; normal heart/lung ratio. Stress Images:  Normal homogeneous uptake in all areas of the myocardium. Rest Images:  Normal homogeneous uptake in all areas of the myocardium. Subtraction (SDS):  No evidence of ischemia.  Impression Exercise Capacity:  Lexiscan with low level exercise. BP Response:  Normal blood pressure response. Clinical Symptoms:  No significant symptoms noted. ECG Impression:  No significant ST segment change suggestive of ischemia. Comparison with Prior Nuclear Study: No previous nuclear study performed  Overall Impression:  Normal stress nuclear study.  LV Wall Motion:  NL LV Function; NL Wall Motion   Sylvia Harp, MD  08/30/2013 12:51 PM

## 2013-09-12 ENCOUNTER — Encounter: Payer: Self-pay | Admitting: Gynecology

## 2013-09-12 ENCOUNTER — Ambulatory Visit (INDEPENDENT_AMBULATORY_CARE_PROVIDER_SITE_OTHER): Payer: Managed Care, Other (non HMO) | Admitting: Gynecology

## 2013-09-12 VITALS — BP 110/66 | Ht 61.0 in | Wt 135.0 lb

## 2013-09-12 DIAGNOSIS — N952 Postmenopausal atrophic vaginitis: Secondary | ICD-10-CM

## 2013-09-12 DIAGNOSIS — N951 Menopausal and female climacteric states: Secondary | ICD-10-CM

## 2013-09-12 DIAGNOSIS — M899 Disorder of bone, unspecified: Secondary | ICD-10-CM

## 2013-09-12 DIAGNOSIS — M8580 Other specified disorders of bone density and structure, unspecified site: Secondary | ICD-10-CM

## 2013-09-12 DIAGNOSIS — M949 Disorder of cartilage, unspecified: Secondary | ICD-10-CM

## 2013-09-12 DIAGNOSIS — Z01419 Encounter for gynecological examination (general) (routine) without abnormal findings: Secondary | ICD-10-CM

## 2013-09-12 MED ORDER — VENLAFAXINE HCL 75 MG PO TABS: 75.0000 mg | ORAL_TABLET | ORAL | Status: DC

## 2013-09-12 NOTE — Progress Notes (Signed)
[name] Feb 19, 1951 [id number]        62 y.o.  G2P2002 for annual exam.  Several issues noted below.  Past medical history,surgical history, problem list, medications, allergies, family history and social history were all reviewed and documented in the EPIC chart.  ROS:  Performed and pertinent positives and negatives are included in the history, assessment and plan .  Exam: Kim assistant Filed Vitals:   09/12/13 0952  BP: 110/66  Height: 5\' 1"  (1.549 m)  Weight: 135 lb (61.236 kg)   General appearance  Normal Skin grossly normal Head/Neck normal with no cervical or supraclavicular adenopathy thyroid normal Lungs  clear Cardiac RR, without RMG Abdominal  soft, nontender, without masses, organomegaly or hernia Breasts  examined lying and sitting without masses, retractions, discharge or axillary adenopathy. Pelvic  Ext/BUS/vagina with generalized atrophic changes.  Cervix atrophic  Uterus anteverted, normal size, shape and contour, midline and mobile nontender   Adnexa  Without masses or tenderness    Anus and perineum  Normal   Rectovaginal  Normal sphincter tone without palpated masses or tenderness.    Assessment/Plan:  63 y.o. G23P2002 female for annual exam.   1. Postmenopausal/atrophic genital changes. Patient had been using Estrace cream but admits to using it less than once monthly. Currently is not sexually active and not having issues with daily vaginal dryness. Recommended she stop this as she does not appear to be using enough to be getting benefit nor being bothered by her symptoms. Patient agrees. 2. Menopausal symptoms. Patient's on Effexor 75 mg QOD and seems to be doing well with this. Wants to continue and I refilled her x1 year. 3. Osteopenia. DEXA 2011 with T score -1.6. FRAX not calculated. Patient is on alendronate and believes that she has been on this for almost 10 years through Dr. Delanna Ahmadi office.. I reviewed the risks of prolonged use to include  osteonecrosis of the jaw, esophageal complications and atypical fractures. Recommend repeat DEXA now with probable recommendation to stop alendronate. We'll further discuss after DEXA results return. Also recommend checking vitamin D level as it apparently has not been checked in some time. No prescription for alendronate provided. 4. Mammography 02/2013. Continue with annual mammography. SBE monthly reviewed. 5. Pap smear/HPV negative 2014. No Pap smear done today. Plan repeat Pap smear at 3-5 year interval. No history of abnormal Pap smears previously. 6. Colonoscopy 2013. Repeat at their recommended interval. 7. Health maintenance. No routine blood work done as this is reportedly done through Dr. Delanna Ahmadi office. Followup for DEXA otherwise annually   Note: This document was prepared with digital dictation and possible smart phrase technology. Any transcriptional errors that result from this process are unintentional.   Anastasio Auerbach MD, 10:21 AM 09/12/2013

## 2013-09-13 LAB — URINALYSIS W MICROSCOPIC + REFLEX CULTURE
BILIRUBIN URINE: NEGATIVE
Bacteria, UA: NONE SEEN
CRYSTALS: NONE SEEN
Casts: NONE SEEN
GLUCOSE, UA: NEGATIVE mg/dL
Hgb urine dipstick: NEGATIVE
Ketones, ur: NEGATIVE mg/dL
LEUKOCYTES UA: NEGATIVE
Nitrite: NEGATIVE
PH: 5.5 (ref 5.0–8.0)
Protein, ur: NEGATIVE mg/dL
SPECIFIC GRAVITY, URINE: 1.018 (ref 1.005–1.030)
SQUAMOUS EPITHELIAL / LPF: NONE SEEN
Urobilinogen, UA: 0.2 mg/dL (ref 0.0–1.0)

## 2013-09-13 LAB — VITAMIN D 25 HYDROXY (VIT D DEFICIENCY, FRACTURES): Vit D, 25-Hydroxy: 44 ng/mL (ref 30–89)

## 2013-09-19 ENCOUNTER — Other Ambulatory Visit: Payer: Self-pay

## 2013-09-19 DIAGNOSIS — M8580 Other specified disorders of bone density and structure, unspecified site: Secondary | ICD-10-CM

## 2013-09-19 HISTORY — DX: Other specified disorders of bone density and structure, unspecified site: M85.80

## 2013-09-19 MED ORDER — METOPROLOL SUCCINATE ER 50 MG PO TB24: 50.0000 mg | ORAL_TABLET | Freq: Every day | ORAL | Status: DC

## 2013-09-19 NOTE — Telephone Encounter (Signed)
Rx was sent to pharmacy electronically. 

## 2013-10-09 ENCOUNTER — Other Ambulatory Visit: Payer: Self-pay | Admitting: Gynecology

## 2013-10-09 ENCOUNTER — Ambulatory Visit (INDEPENDENT_AMBULATORY_CARE_PROVIDER_SITE_OTHER): Payer: Managed Care, Other (non HMO)

## 2013-10-09 ENCOUNTER — Encounter: Payer: Self-pay | Admitting: Gynecology

## 2013-10-09 DIAGNOSIS — M949 Disorder of cartilage, unspecified: Secondary | ICD-10-CM

## 2013-10-09 DIAGNOSIS — M8580 Other specified disorders of bone density and structure, unspecified site: Secondary | ICD-10-CM

## 2013-10-09 DIAGNOSIS — F1010 Alcohol abuse, uncomplicated: Secondary | ICD-10-CM

## 2013-10-09 DIAGNOSIS — M899 Disorder of bone, unspecified: Secondary | ICD-10-CM

## 2013-10-09 DIAGNOSIS — F1020 Alcohol dependence, uncomplicated: Secondary | ICD-10-CM

## 2013-11-20 ENCOUNTER — Telehealth: Payer: Self-pay | Admitting: Cardiovascular Disease

## 2013-11-23 NOTE — Telephone Encounter (Signed)
Closed encounter °

## 2013-12-20 NOTE — Telephone Encounter (Signed)
Encounter complete. 

## 2014-01-03 ENCOUNTER — Telehealth: Payer: Self-pay | Admitting: *Deleted

## 2014-01-03 NOTE — Telephone Encounter (Signed)
Pt was prescribed Effexor 75 mg #30 take one tablet by mouth every other day on OV 09/12/13. Pt only receiving # 15 tablet due to the directions, she asked if you could write direction for take one po daily to get #30? Rather than #15 tablets. Please advise

## 2014-01-04 MED ORDER — VENLAFAXINE HCL 75 MG PO TABS: 75.0000 mg | ORAL_TABLET | Freq: Every day | ORAL | Status: DC

## 2014-01-04 NOTE — Telephone Encounter (Signed)
OK to write for one po QD #30

## 2014-01-04 NOTE — Telephone Encounter (Signed)
rx sent as directed below,left on voicemail this has been done

## 2014-01-16 ENCOUNTER — Encounter: Payer: Self-pay | Admitting: Cardiovascular Disease

## 2014-02-05 ENCOUNTER — Other Ambulatory Visit: Payer: Self-pay

## 2014-02-05 ENCOUNTER — Other Ambulatory Visit: Payer: Self-pay | Admitting: Gynecology

## 2014-02-05 DIAGNOSIS — Z1231 Encounter for screening mammogram for malignant neoplasm of breast: Secondary | ICD-10-CM

## 2014-02-06 ENCOUNTER — Telehealth: Payer: Self-pay | Admitting: Cardiovascular Disease

## 2014-02-06 MED ORDER — SIMVASTATIN 40 MG PO TABS: 40.0000 mg | ORAL_TABLET | Freq: Every day | ORAL | Status: DC

## 2014-02-06 NOTE — Telephone Encounter (Signed)
Rx refill sent to patient pharmacy   

## 2014-02-06 NOTE — Telephone Encounter (Signed)
Need a new prescription for her Simvastatin 40 mg #30. Please call to [phone].

## 2014-02-26 ENCOUNTER — Encounter: Payer: Self-pay | Admitting: Cardiovascular Disease

## 2014-02-26 ENCOUNTER — Ambulatory Visit (INDEPENDENT_AMBULATORY_CARE_PROVIDER_SITE_OTHER): Payer: Managed Care, Other (non HMO) | Admitting: Cardiovascular Disease

## 2014-02-26 VITALS — BP 130/82 | HR 60 | Ht 61.0 in | Wt 138.4 lb

## 2014-02-26 DIAGNOSIS — Z950 Presence of cardiac pacemaker: Secondary | ICD-10-CM

## 2014-02-26 DIAGNOSIS — Z79899 Other long term (current) drug therapy: Secondary | ICD-10-CM

## 2014-02-26 DIAGNOSIS — E7800 Pure hypercholesterolemia, unspecified: Secondary | ICD-10-CM

## 2014-02-26 DIAGNOSIS — E782 Mixed hyperlipidemia: Secondary | ICD-10-CM

## 2014-02-26 DIAGNOSIS — I10 Essential (primary) hypertension: Secondary | ICD-10-CM

## 2014-02-26 NOTE — Patient Instructions (Signed)
  We will see you back in follow up in 1 year with Dr Berry.   Dr Berry has ordered: 1. Your physician recommends that you return for a FASTING lipid profile:     

## 2014-02-26 NOTE — Progress Notes (Signed)
02/26/2014 [name]   December 09, 1950  [id number]  Primary Physician Sylvia Kilts, MD Primary Cardiologist: Sylvia Harp MD [name]   HPI:  [name] is a very pleasant fit-appearing 63 year old married Caucasian female mother of 2, grandmother and 2 grandchildren who is retired Dance movement psychotherapist. Her primary care physician is [name] in [place]. I am assuming her care from [name] who was her previous cardiologist. Her cardiac risk factor profile is positive for 60 pack years of tobacco abuse having quit 10 years ago as was treated hypertension and hyperlipidemia. There is no family history. She has never had a heart attack or stroke. She had a normal cath in 2005. 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 She had negative Myoview stress test earlier this year for scapular pain which subsequently subsided.   Current Outpatient Prescriptions  Medication Sig Dispense Refill  . alendronate (FOSAMAX) 70 MG tablet Take 70 mg by mouth every 7 (seven) days. Take with a full glass of water on an empty stomach.       Marland Kitchen aspirin 81 MG chewable tablet Chew 81 mg by mouth daily.        . calcium carbonate (OS-CAL) 600 MG TABS Take 600 mg by mouth 2 (two) times daily with a meal.        . Glucosamine-Chondroit-Vit C-Mn (GLUCOSAMINE 1500 COMPLEX PO) Take 2 tablets by mouth daily.       Marland Kitchen ibuprofen (ADVIL,MOTRIN) 200 MG tablet Take 200 mg by mouth every 6 (six) hours as needed. Just occasional      . Krill Oil 500 MG CAPS Take 1 capsule by mouth 2 (two) times daily.       Marland Kitchen lisinopril-hydrochlorothiazide (PRINZIDE,ZESTORETIC) 20-12.5 MG per tablet Take 1 tablet by mouth daily.        Marland Kitchen Lysine 500 MG TABS Take 1 tablet by mouth daily.       . metoprolol succinate (TOPROL-XL) 50 MG 24 hr tablet Take 1 tablet (50 mg total) by mouth daily.  30 tablet  11  . Multiple Vitamin (MULTIVITAMIN) capsule Take 1 capsule by mouth daily.        . naproxen  sodium (ALEVE) 220 MG tablet Take 220 mg by mouth daily as needed (pain).      Marland Kitchen oxymetazoline (AFRIN) 0.05 % nasal spray Place 2 sprays into the nose at bedtime.      . simvastatin (ZOCOR) 40 MG tablet Take 1 tablet (40 mg total) by mouth at bedtime.  30 tablet  5  . Terbinafine HCl 1 % SOLN Apply 1 drop topically at bedtime. For each toe      . venlafaxine (EFFEXOR) 75 MG tablet Take 75 mg by mouth every other day.       No current facility-administered medications for this visit.    Allergies  Allergen Reactions  . Vicodin [Hydrocodone-Acetaminophen] Nausea Only    History   Social History  . Marital Status: Married    Spouse Name: N/A    Number of Children: N/A  . Years of Education: N/A   Occupational History  . Not on file.   Social History Main Topics  . Smoking status: Former Smoker -- 1.50 packs/day for 30 years    Types: Cigarettes  . Smokeless tobacco: Never Used  . Alcohol Use: 4.2 oz/week    7 Glasses of wine per week  . Drug Use: No  . Sexual Activity: No  Comment: Tubal lig   Other Topics Concern  . Not on file   Social History Narrative  . No narrative on file     Review of Systems: General: negative for chills, fever, night sweats or weight changes.  Cardiovascular: negative for chest pain, dyspnea on exertion, edema, orthopnea, palpitations, paroxysmal nocturnal dyspnea or shortness of breath Dermatological: negative for rash Respiratory: negative for cough or wheezing Urologic: negative for hematuria Abdominal: negative for nausea, vomiting, diarrhea, bright red blood per rectum, melena, or hematemesis Neurologic: negative for visual changes, syncope, or dizziness All other systems reviewed and are otherwise negative except as noted above.    Blood pressure 130/82, pulse 60, height 5\' 1"  (1.549 m), weight 138 lb 6.4 oz (62.778 kg).  General appearance: alert and no distress Neck: no adenopathy, no carotid bruit, no JVD, supple,  symmetrical, trachea midline and thyroid not enlarged, symmetric, no tenderness/mass/nodules Lungs: clear to auscultation bilaterally Heart: regular rate and rhythm, S1, S2 normal, no murmur, click, rub or gallop Extremities: extremities normal, atraumatic, no cyanosis or edema and 2+ pedal pulses bilaterally  EKG AV paced rhythm  ASSESSMENT AND PLAN:   Hypertension Controlled on current medications  Elevated cholesterol On statin therapy. We will recheck a lipid and liver profile  History of permanent cardiac pacemaker placement Inserted into 11 because of symptomatic bradycardia. She has not had this checked in a while. We'll arrange for her to see [name] for  ongoing care of this.      Sylvia Harp MD FACP,FACC,FAHA, [facility] 02/26/2014 11:10 AM

## 2014-02-26 NOTE — Assessment & Plan Note (Signed)
On statin therapy. We will recheck a lipid and liver profile 

## 2014-02-26 NOTE — Assessment & Plan Note (Signed)
Inserted into 11 because of symptomatic bradycardia. She has not had this checked in a while. We'll arrange for her to see Dr. Sallyanne Kuster for  ongoing care of this.

## 2014-02-26 NOTE — Assessment & Plan Note (Signed)
Controlled on current medications 

## 2014-02-28 LAB — HEPATIC FUNCTION PANEL
ALBUMIN: 4.1 g/dL (ref 3.5–5.2)
ALK PHOS: 46 U/L (ref 39–117)
ALT: 21 U/L (ref 0–35)
AST: 22 U/L (ref 0–37)
BILIRUBIN INDIRECT: 0.4 mg/dL (ref 0.2–1.2)
Bilirubin, Direct: 0.1 mg/dL (ref 0.0–0.3)
TOTAL PROTEIN: 6.2 g/dL (ref 6.0–8.3)
Total Bilirubin: 0.5 mg/dL (ref 0.2–1.2)

## 2014-02-28 LAB — LIPID PANEL
CHOLESTEROL: 147 mg/dL (ref 0–200)
HDL: 53 mg/dL (ref >39)
LDL Cholesterol: 71 mg/dL (ref 0–99)
TRIGLYCERIDES: 115 mg/dL (ref <150)
Total CHOL/HDL Ratio: 2.8 Ratio
VLDL: 23 mg/dL (ref 0–40)

## 2014-03-04 ENCOUNTER — Ambulatory Visit: Payer: Managed Care, Other (non HMO)

## 2014-03-04 ENCOUNTER — Ambulatory Visit (HOSPITAL_COMMUNITY)
Admission: RE | Admit: 2014-03-04 | Discharge: 2014-03-04 | Disposition: A | Discharge status: Home / Self Care | Payer: Managed Care, Other (non HMO) | Source: Ambulatory Visit | Attending: Gynecology | Admitting: Gynecology

## 2014-03-04 DIAGNOSIS — Z1231 Encounter for screening mammogram for malignant neoplasm of breast: Secondary | ICD-10-CM | POA: Diagnosis not present

## 2014-03-22 ENCOUNTER — Ambulatory Visit (INDEPENDENT_AMBULATORY_CARE_PROVIDER_SITE_OTHER): Payer: Managed Care, Other (non HMO) | Admitting: *Deleted

## 2014-03-22 DIAGNOSIS — R001 Bradycardia, unspecified: Secondary | ICD-10-CM

## 2014-03-23 ENCOUNTER — Other Ambulatory Visit: Payer: Self-pay | Admitting: Cardiovascular Disease

## 2014-03-25 ENCOUNTER — Encounter: Payer: Self-pay | Admitting: Cardiovascular Disease

## 2014-03-25 NOTE — Progress Notes (Signed)
Remote pacemaker transmission.   

## 2014-03-26 LAB — MDC_IDC_ENUM_SESS_TYPE_REMOTE
Battery Voltage: 2.8 V
Brady Statistic AP VS Percent: 0 %
Brady Statistic AS VS Percent: 0 %
Lead Channel Impedance Value: 509 Ohm
Lead Channel Pacing Threshold Amplitude: 0.375 V
Lead Channel Pacing Threshold Amplitude: 0.625 V
Lead Channel Pacing Threshold Pulse Width: 0.4 ms
Lead Channel Pacing Threshold Pulse Width: 0.4 ms
Lead Channel Setting Pacing Amplitude: 1.5 V
Lead Channel Setting Pacing Pulse Width: 0.4 ms
MDC IDC MSMT BATTERY IMPEDANCE: 277 Ohm
MDC IDC MSMT BATTERY REMAINING LONGEVITY: 111 mo
MDC IDC MSMT LEADCHNL RA IMPEDANCE VALUE: 488 Ohm
MDC IDC MSMT LEADCHNL RA SENSING INTR AMPL: 2.8 mV
MDC IDC SESS DTM: 20151003204308
MDC IDC SET LEADCHNL RV PACING AMPLITUDE: 2.25 V
MDC IDC SET LEADCHNL RV SENSING SENSITIVITY: 2.8 mV
MDC IDC STAT BRADY AP VP PERCENT: 8 %
MDC IDC STAT BRADY AS VP PERCENT: 92 %

## 2014-04-05 ENCOUNTER — Encounter: Payer: Self-pay | Admitting: Cardiology

## 2014-04-17 ENCOUNTER — Encounter: Payer: Self-pay | Admitting: Cardiovascular Disease

## 2014-04-22 ENCOUNTER — Encounter: Payer: Self-pay | Admitting: Cardiovascular Disease

## 2014-05-28 ENCOUNTER — Encounter: Payer: Self-pay | Admitting: Podiatry

## 2014-05-28 ENCOUNTER — Ambulatory Visit (INDEPENDENT_AMBULATORY_CARE_PROVIDER_SITE_OTHER): Payer: Managed Care, Other (non HMO) | Admitting: Podiatry

## 2014-05-28 VITALS — BP 167/86 | HR 60 | Resp 12 | Ht 61.0 in | Wt 137.0 lb

## 2014-05-28 DIAGNOSIS — B351 Tinea unguium: Secondary | ICD-10-CM

## 2014-05-28 DIAGNOSIS — L603 Nail dystrophy: Secondary | ICD-10-CM

## 2014-05-28 NOTE — Progress Notes (Signed)
   Subjective:    Patient ID: [name], female    DOB: Mar 04, 1951, 63 y.o.   MRN: [id number]  HPI Comments: Pt states she has used [place] 1000mg  topical for over a year, and had many years previously used Lamisil orally.     Review of Systems  HENT: Positive for congestion and sinus pressure.   Hematological: Bruises/bleeds easily.  All other systems reviewed and are negative.      Objective:   Physical Exam: I reviewed her past medical history medications social history and review of systems. Pulses are strong and palpable bilateral. Neurologic sensorium is intact versus lasting monofilament. Deep tendon reflexes are intact bilateral and muscle strength +5 over 5 dorsiflexion plantar flexors and inverters and everters all into the musculature is intact. Orthopedic evaluation demonstrates all joints distally to the ankle full range of motion without crepitation. Cutaneous evaluation demonstrates supple well-hydrated cutis nails are thick yellow dystrophic onychomycotic and painful palpation.        Assessment & Plan:  Assessment: Nail dystrophy can rule out onychomycosis bilateral.  Plan: Nail samples were taken today and sent for pathologic evaluation will follow up with her once those come in.

## 2014-06-18 ENCOUNTER — Telehealth: Payer: Self-pay | Admitting: *Deleted

## 2014-06-18 DIAGNOSIS — Z79899 Other long term (current) drug therapy: Secondary | ICD-10-CM

## 2014-06-18 NOTE — Telephone Encounter (Signed)
Pt request results from 05/28/2014 toenail biopsy.  [facility] to fax the results to our office.

## 2014-06-19 NOTE — Telephone Encounter (Signed)
I attempted to call the patient on her home number.  I left her a message to call me back.  I then called on her mobile.  I informed her I was returning her call.  Dr. Milinda Pointer did receive your culture results.  Your toenail was negative for fungus but positive for Saprophytic Mold.  Dr. Milinda Pointer wants to start you on Onmel.  "Okay, that will be fine."  I will send the prescription to [facility] in [place].  "Okay, thank you."  I was getting ready to prescribe Onmel but an alert popped up about interaction with Zocor.  I need to make sure this is feasible with Dr. Milinda Pointer before entering.

## 2014-06-20 NOTE — Telephone Encounter (Signed)
Call the rep and ask what type of reaction this may have with zocor.  We may just need to get liver test

## 2014-06-24 ENCOUNTER — Telehealth: Payer: Self-pay | Admitting: *Deleted

## 2014-06-24 NOTE — Telephone Encounter (Signed)
Pt called to see if the medication would be called in to [facility].

## 2014-06-26 NOTE — Telephone Encounter (Signed)
Pt states she is waiting to hear whether her medication can be called to [facility].

## 2014-06-26 NOTE — Telephone Encounter (Signed)
I called and spoke to [facility] and she recommended that Onmel not be prescribed if patient is taking Statin drugs (Zocor).  "It inhibits metabolism and cause skeletal muscle problems.  I will have someone call and speak to Dr. Milinda Pointer in regards to your question.  They can give him more detail."  I informed her that Dr. Milinda Pointer will not be in the office this week.  Will be in the [place] office on Monday.  "Okay, I'll get someone to give him a call."

## 2014-06-27 NOTE — Telephone Encounter (Signed)
"  I'm calling in regards to a medication that was supposed to have been sent to [facility].  It still hasn't been called in."  I informed her that I was going to send over a prescription for Onmel but it had an alert.  So, I didn't put it in.  There was an interaction between Onmel and Statin drugs.  Dr. Milinda Pointer wanted me to contact the drug rep to see what the reaction is.  I did so.  Dr. Milinda Pointer has been out of the office.  So, I'm waiting on him to tell me what to do.  I will call you when I get a response.  "Okay, please call, it's better to hear something rather than nothing.

## 2014-06-28 NOTE — Telephone Encounter (Signed)
Inform patient of this.  We can still use lamisil but must have liver test. Otherwise she could try laser or topical (kerydin)

## 2014-07-01 MED ORDER — TERBINAFINE HCL 250 MG PO TABS: 250.0000 mg | ORAL_TABLET | Freq: Every day | ORAL | Status: DC

## 2014-07-01 NOTE — Telephone Encounter (Signed)
I called and informed her that Dr. Milinda Pointer said you can take Lamisil but you will have to do a liver test or you can use a topical called Kerydin.  "Which one is better?"  Lamisil is better.  "I have taken Terbinafine before.  If I choose to take that, how long will I take it?"  You will take it for 3 months.  "How will I go about doing this?"  I will send the prescription to your pharmacy.  I can mail the lab request to you or you can come to pick it up.  "I'll come by to pick it up.  It won't be today."  It will be at front desk, you can pick it up at your convenience.  "Okay thank you."

## 2014-07-03 ENCOUNTER — Encounter: Payer: Self-pay | Admitting: Podiatry

## 2014-07-03 ENCOUNTER — Telehealth: Payer: Self-pay | Admitting: *Deleted

## 2014-07-03 LAB — HEPATIC FUNCTION PANEL
ALBUMIN: 4.2 g/dL (ref 3.5–5.2)
ALK PHOS: 50 U/L (ref 39–117)
ALT: 23 U/L (ref 0–35)
AST: 23 U/L (ref 0–37)
Bilirubin, Direct: <0.1 mg/dL (ref 0.0–0.3)
Total Bilirubin: 0.5 mg/dL (ref 0.2–1.2)
Total Protein: 6.6 g/dL (ref 6.0–8.3)

## 2014-07-03 NOTE — Telephone Encounter (Signed)
I called and left her a message that her labs were okay on her home phone.  Spoke to her on he mobile and informed her that labs were good.  "Thanks so much."

## 2014-07-04 ENCOUNTER — Telehealth: Payer: Self-pay | Admitting: *Deleted

## 2014-07-04 NOTE — Telephone Encounter (Signed)
-----   Message from Garrel Ridgel, [place] sent at 07/03/2014 12:08 PM EST ----- Blood work looks good may continue with medication.

## 2014-07-04 NOTE — Telephone Encounter (Signed)
LEFT MESSAGE FOR PATIENT REGARDING HER BLOOD WORK

## 2014-08-06 ENCOUNTER — Other Ambulatory Visit: Payer: Self-pay | Admitting: Cardiovascular Disease

## 2014-08-07 NOTE — Telephone Encounter (Signed)
Rx(s) sent to pharmacy electronically.  

## 2014-09-18 ENCOUNTER — Encounter: Payer: Managed Care, Other (non HMO) | Admitting: Gynecology

## 2014-09-21 ENCOUNTER — Other Ambulatory Visit: Payer: Self-pay | Admitting: Cardiovascular Disease

## 2014-09-23 NOTE — Telephone Encounter (Signed)
Rx(s) sent to pharmacy electronically.  

## 2014-11-29 ENCOUNTER — Ambulatory Visit (INDEPENDENT_AMBULATORY_CARE_PROVIDER_SITE_OTHER): Payer: Medicare Other | Admitting: *Deleted

## 2014-11-29 DIAGNOSIS — R001 Bradycardia, unspecified: Secondary | ICD-10-CM

## 2014-11-29 NOTE — Progress Notes (Signed)
Remote pacemaker transmission.   

## 2014-12-04 ENCOUNTER — Encounter: Payer: Self-pay | Admitting: Gynecology

## 2014-12-04 ENCOUNTER — Ambulatory Visit (INDEPENDENT_AMBULATORY_CARE_PROVIDER_SITE_OTHER): Payer: Medicare Other | Admitting: Gynecology

## 2014-12-04 VITALS — BP 116/74 | Ht 62.0 in | Wt 135.0 lb

## 2014-12-04 DIAGNOSIS — M8580 Other specified disorders of bone density and structure, unspecified site: Secondary | ICD-10-CM | POA: Diagnosis not present

## 2014-12-04 DIAGNOSIS — Z01419 Encounter for gynecological examination (general) (routine) without abnormal findings: Secondary | ICD-10-CM | POA: Diagnosis not present

## 2014-12-04 DIAGNOSIS — N952 Postmenopausal atrophic vaginitis: Secondary | ICD-10-CM

## 2014-12-04 DIAGNOSIS — N951 Menopausal and female climacteric states: Secondary | ICD-10-CM | POA: Diagnosis not present

## 2014-12-04 MED ORDER — VENLAFAXINE HCL 75 MG PO TABS: 75.0000 mg | ORAL_TABLET | Freq: Two times a day (BID) | ORAL | Status: DC

## 2014-12-04 NOTE — Addendum Note (Signed)
Addended by: Anastasio Auerbach on: 12/04/2014 10:23 AM   Modules accepted: Orders

## 2014-12-04 NOTE — Patient Instructions (Signed)
Stop the Fosamax as we discussed. We will plan on redoing the bone density next year. Call if you need more of the vaginal estrogen cream.  You may obtain a copy of any labs that were done today by logging onto MyChart as outlined in the instructions provided with your AVS (after visit summary). The office will not call with normal lab results but certainly if there are any significant abnormalities then we will contact you.   Health Maintenance, Female A healthy lifestyle and preventative care can promote health and wellness.  Maintain regular health, dental, and eye exams.  Eat a healthy diet. Foods like vegetables, fruits, whole grains, low-fat dairy products, and lean protein foods contain the nutrients you need without too many calories. Decrease your intake of foods high in solid fats, added sugars, and salt. Get information about a proper diet from your caregiver, if necessary.  Regular physical exercise is one of the most important things you can do for your health. Most adults should get at least 150 minutes of moderate-intensity exercise (any activity that increases your heart rate and causes you to sweat) each week. In addition, most adults need muscle-strengthening exercises on 2 or more days a week.   Maintain a healthy weight. The body mass index (BMI) is a screening tool to identify possible weight problems. It provides an estimate of body fat based on height and weight. Your caregiver can help determine your BMI, and can help you achieve or maintain a healthy weight. For adults 20 years and older:  A BMI below 18.5 is considered underweight.  A BMI of 18.5 to 24.9 is normal.  A BMI of 25 to 29.9 is considered overweight.  A BMI of 30 and above is considered obese.  Maintain normal blood lipids and cholesterol by exercising and minimizing your intake of saturated fat. Eat a balanced diet with plenty of fruits and vegetables. Blood tests for lipids and cholesterol should begin  at age 85 and be repeated every 5 years. If your lipid or cholesterol levels are high, you are over 50, or you are a high risk for heart disease, you may need your cholesterol levels checked more frequently.Ongoing high lipid and cholesterol levels should be treated with medicines if diet and exercise are not effective.  If you smoke, find out from your caregiver how to quit. If you do not use tobacco, do not start.  Lung cancer screening is recommended for adults aged 7 80 years who are at high risk for developing lung cancer because of a history of smoking. Yearly low-dose computed tomography (CT) is recommended for people who have at least a 30-pack-year history of smoking and are a current smoker or have quit within the past 15 years. A pack year of smoking is smoking an average of 1 pack of cigarettes a day for 1 year (for example: 1 pack a day for 30 years or 2 packs a day for 15 years). Yearly screening should continue until the smoker has stopped smoking for at least 15 years. Yearly screening should also be stopped for people who develop a health problem that would prevent them from having lung cancer treatment.  If you are pregnant, do not drink alcohol. If you are breastfeeding, be very cautious about drinking alcohol. If you are not pregnant and choose to drink alcohol, do not exceed 1 drink per day. One drink is considered to be 12 ounces (355 mL) of beer, 5 ounces (148 mL) of wine, or 1.5 ounces (  44 mL) of liquor.  Avoid use of street drugs. Do not share needles with anyone. Ask for help if you need support or instructions about stopping the use of drugs.  High blood pressure causes heart disease and increases the risk of stroke. Blood pressure should be checked at least every 1 to 2 years. Ongoing high blood pressure should be treated with medicines, if weight loss and exercise are not effective.  If you are 86 to 65 years old, ask your caregiver if you should take aspirin to prevent  strokes.  Diabetes screening involves taking a blood sample to check your fasting blood sugar level. This should be done once every 3 years, after age 57, if you are within normal weight and without risk factors for diabetes. Testing should be considered at a younger age or be carried out more frequently if you are overweight and have at least 1 risk factor for diabetes.  Breast cancer screening is essential preventative care for women. You should practice "breast self-awareness." This means understanding the normal appearance and feel of your breasts and may include breast self-examination. Any changes detected, no matter how small, should be reported to a caregiver. Women in their 18s and 30s should have a clinical breast exam (CBE) by a caregiver as part of a regular health exam every 1 to 3 years. After age 69, women should have a CBE every year. Starting at age 80, women should consider having a mammogram (breast X-ray) every year. Women who have a family history of breast cancer should talk to their caregiver about genetic screening. Women at a high risk of breast cancer should talk to their caregiver about having an MRI and a mammogram every year.  Breast cancer gene (BRCA)-related cancer risk assessment is recommended for women who have family members with BRCA-related cancers. BRCA-related cancers include breast, ovarian, tubal, and peritoneal cancers. Having family members with these cancers may be associated with an increased risk for harmful changes (mutations) in the breast cancer genes BRCA1 and BRCA2. Results of the assessment will determine the need for genetic counseling and BRCA1 and BRCA2 testing.  The Pap test is a screening test for cervical cancer. Women should have a Pap test starting at age 37. Between ages 20 and 18, Pap tests should be repeated every 2 years. Beginning at age 78, you should have a Pap test every 3 years as long as the past 3 Pap tests have been normal. If you had a  hysterectomy for a problem that was not cancer or a condition that could lead to cancer, then you no longer need Pap tests. If you are between ages 81 and 40, and you have had normal Pap tests going back 10 years, you no longer need Pap tests. If you have had past treatment for cervical cancer or a condition that could lead to cancer, you need Pap tests and screening for cancer for at least 20 years after your treatment. If Pap tests have been discontinued, risk factors (such as a new sexual partner) need to be reassessed to determine if screening should be resumed. Some women have medical problems that increase the chance of getting cervical cancer. In these cases, your caregiver may recommend more frequent screening and Pap tests.  The human papillomavirus (HPV) test is an additional test that may be used for cervical cancer screening. The HPV test looks for the virus that can cause the cell changes on the cervix. The cells collected during the Pap test can  be tested for HPV. The HPV test could be used to screen women aged 81 years and older, and should be used in women of any age who have unclear Pap test results. After the age of 79, women should have HPV testing at the same frequency as a Pap test.  Colorectal cancer can be detected and often prevented. Most routine colorectal cancer screening begins at the age of 28 and continues through age 26. However, your caregiver may recommend screening at an earlier age if you have risk factors for colon cancer. On a yearly basis, your caregiver may provide home test kits to check for hidden blood in the stool. Use of a small camera at the end of a tube, to directly examine the colon (sigmoidoscopy or colonoscopy), can detect the earliest forms of colorectal cancer. Talk to your caregiver about this at age 16, when routine screening begins. Direct examination of the colon should be repeated every 5 to 10 years through age 67, unless early forms of pre-cancerous  polyps or small growths are found.  Hepatitis C blood testing is recommended for all people born from [birth year] through 1965 and any individual with known risks for hepatitis C.  Practice safe sex. Use condoms and avoid high-risk sexual practices to reduce the spread of sexually transmitted infections (STIs). Sexually active women aged 30 and younger should be checked for Chlamydia, which is a common sexually transmitted infection. Older women with new or multiple partners should also be tested for Chlamydia. Testing for other STIs is recommended if you are sexually active and at increased risk.  Osteoporosis is a disease in which the bones lose minerals and strength with aging. This can result in serious bone fractures. The risk of osteoporosis can be identified using a bone density scan. Women ages 32 and over and women at risk for fractures or osteoporosis should discuss screening with their caregivers. Ask your caregiver whether you should be taking a calcium supplement or vitamin D to reduce the rate of osteoporosis.  Menopause can be associated with physical symptoms and risks. Hormone replacement therapy is available to decrease symptoms and risks. You should talk to your caregiver about whether hormone replacement therapy is right for you.  Use sunscreen. Apply sunscreen liberally and repeatedly throughout the day. You should seek shade when your shadow is shorter than you. Protect yourself by wearing long sleeves, pants, a wide-brimmed hat, and sunglasses year round, whenever you are outdoors.  Notify your caregiver of new moles or changes in moles, especially if there is a change in shape or color. Also notify your caregiver if a mole is larger than the size of a pencil eraser.  Stay current with your immunizations. Document Released: 12/21/2010 Document Revised: 10/02/2012 Document Reviewed: 12/21/2010 [facility] Patient Information 2014 [place].

## 2014-12-04 NOTE — Progress Notes (Signed)
[name] September 13, 1950 [id number]        63 y.o.  G2P2002 for breast and pelvic exam.  Past medical history,surgical history, problem list, medications, allergies, family history and social history were all reviewed and documented as reviewed in the EPIC chart.  ROS:  Performed with pertinent positives and negatives included in the history, assessment and plan.   Additional significant findings :  none   Exam: Kim Counsellor Vitals:   12/04/14 0944  BP: 116/74  Height: 5\' 2"  (1.575 m)  Weight: 135 lb (61.236 kg)   General appearance:  Normal affect, orientation and appearance. Skin: Grossly normal HEENT: Without gross lesions.  No cervical or supraclavicular adenopathy. Thyroid normal.  Lungs:  Clear without wheezing, rales or rhonchi Cardiac: RR, without RMG Abdominal:  Soft, nontender, without masses, guarding, rebound, organomegaly or hernia Breasts:  Examined lying and sitting without masses, retractions, discharge or axillary adenopathy. Pelvic:  Ext/BUS/vagina with atrophic changes  Cervix with atrophic changes  Uterus anteverted, normal size, shape and contour, midline and mobile nontender   Adnexa  Without masses or tenderness    Anus and perineum  Normal   Rectovaginal  Normal sphincter tone without palpated masses or tenderness.    Assessment/Plan:  64 y.o. [id number] female for breast and pelvic exam.   1. Postmenopausal/atrophic genital changes. Patient doing well with with Effexor 75 mg every other day to control her hot flushes and occasional use of Estrace vaginal cream externally whenever she becomes more dry. Refill of Effexor 1 year provided. She does have estrogen cream at home will call when she needs more. No vaginal bleeding. Patient knows to report any vaginal bleeding. The issues again of absorption of the estrogen with systemic side effects such as thrombosis/endometrial stimulation/breast cancer discussed. Does not use very often but does find it  beneficial for occasional use. 2. Osteopenia.  DEXA 2015 T score -1.9. Stable from prior DEXA.  Patient's been on Fosamax for probably 10 years through Dr. Cornelia Copa office. Recommend she stop Fosamax now reviewing the risks to include atypical fractures with prolonged use. I really feel she needs to stop this repeat density next year and then follow expectantly on a drug-free holiday. Patient agrees to do so. 3. Mammography coming due in September and I reminded her to schedule this. SBE monthly reviewed. 4. Pap smear/HPV negative 2014. No history of significant abnormal Pap smears. Plan repeat Pap smear in 3-5 year interval per current screening guidelines. 5. Colonoscopy 2013. Repeat at their recommended interval. 6. Health maintenance. No routine blood work done and she has since done at her primary physician's office. Follow up in one year, sooner as needed.     Anastasio Auerbach MD, 10:06 AM 12/04/2014

## 2014-12-05 LAB — URINALYSIS W MICROSCOPIC + REFLEX CULTURE
Bacteria, UA: NONE SEEN
Bilirubin Urine: NEGATIVE
CRYSTALS: NONE SEEN
Casts: NONE SEEN
GLUCOSE, UA: NEGATIVE mg/dL
Hgb urine dipstick: NEGATIVE
Ketones, ur: NEGATIVE mg/dL
Leukocytes, UA: NEGATIVE
Nitrite: NEGATIVE
Protein, ur: NEGATIVE mg/dL
SPECIFIC GRAVITY, URINE: 1.01 (ref 1.005–1.030)
SQUAMOUS EPITHELIAL / LPF: NONE SEEN
UROBILINOGEN UA: 0.2 mg/dL (ref 0.0–1.0)
pH: 6 (ref 5.0–8.0)

## 2014-12-05 LAB — CUP PACEART REMOTE DEVICE CHECK
Battery Remaining Longevity: 104 mo
Brady Statistic AP VP Percent: 10 %
Brady Statistic AP VS Percent: 0 %
Brady Statistic AS VP Percent: 90 %
Lead Channel Impedance Value: 527 Ohm
Lead Channel Pacing Threshold Amplitude: 0.75 V
Lead Channel Pacing Threshold Pulse Width: 0.4 ms
Lead Channel Setting Pacing Amplitude: 1.5 V
Lead Channel Setting Sensing Sensitivity: 2.8 mV
MDC IDC MSMT BATTERY IMPEDANCE: 350 Ohm
MDC IDC MSMT BATTERY VOLTAGE: 2.8 V
MDC IDC MSMT LEADCHNL RA IMPEDANCE VALUE: 462 Ohm
MDC IDC MSMT LEADCHNL RA PACING THRESHOLD AMPLITUDE: 0.375 V
MDC IDC MSMT LEADCHNL RA PACING THRESHOLD PULSEWIDTH: 0.4 ms
MDC IDC MSMT LEADCHNL RA SENSING INTR AMPL: 1.4 mV
MDC IDC SESS DTM: 20160610144939
MDC IDC SET LEADCHNL RV PACING AMPLITUDE: 2.25 V
MDC IDC SET LEADCHNL RV PACING PULSEWIDTH: 0.4 ms
MDC IDC STAT BRADY AS VS PERCENT: 0 %

## 2014-12-06 DIAGNOSIS — L259 Unspecified contact dermatitis, unspecified cause: Secondary | ICD-10-CM | POA: Diagnosis not present

## 2014-12-06 DIAGNOSIS — M1991 Primary osteoarthritis, unspecified site: Secondary | ICD-10-CM | POA: Diagnosis not present

## 2014-12-06 DIAGNOSIS — L255 Unspecified contact dermatitis due to plants, except food: Secondary | ICD-10-CM | POA: Diagnosis not present

## 2014-12-06 DIAGNOSIS — Z6822 Body mass index (BMI) 22.0-22.9, adult: Secondary | ICD-10-CM | POA: Diagnosis not present

## 2014-12-11 ENCOUNTER — Telehealth: Payer: Self-pay | Admitting: Cardiovascular Disease

## 2014-12-11 NOTE — Telephone Encounter (Signed)
Returned call to patient no answer.LMTC. 

## 2014-12-11 NOTE — Telephone Encounter (Signed)
Pt is returning Barbara's call in regards to getting her scheduled to see Dr. Loletha Grayer. Please call   Thanks

## 2014-12-16 ENCOUNTER — Telehealth: Payer: Self-pay | Admitting: Cardiovascular Disease

## 2014-12-16 NOTE — Telephone Encounter (Signed)
Patient would like to switch "back" to Dr. Loletha Grayer since he can take care of pacer and other issues.  Will have scheduling contact patient with an appt with Dr. Loletha Grayer.  Patient voiced understanding.

## 2014-12-16 NOTE — Telephone Encounter (Signed)
Mrs. Lobosco is returning a call to Pamala Hurry that gave her a call . Please call   Thanks

## 2014-12-16 NOTE — Telephone Encounter (Signed)
FORWARD TO BARBARA L.CMA

## 2014-12-17 NOTE — Telephone Encounter (Signed)
See telephone note from 12-16-14.

## 2014-12-18 ENCOUNTER — Telehealth: Payer: Self-pay | Admitting: Cardiovascular Disease

## 2014-12-18 NOTE — Telephone Encounter (Signed)
Closed encounter °

## 2014-12-19 ENCOUNTER — Encounter: Payer: Self-pay | Admitting: Cardiology

## 2014-12-25 ENCOUNTER — Encounter: Payer: Self-pay | Admitting: Cardiovascular Disease

## 2015-01-28 ENCOUNTER — Encounter: Payer: Self-pay | Admitting: Cardiovascular Disease

## 2015-01-28 ENCOUNTER — Ambulatory Visit (INDEPENDENT_AMBULATORY_CARE_PROVIDER_SITE_OTHER): Payer: Medicare Other | Admitting: Cardiovascular Disease

## 2015-01-28 VITALS — BP 138/90 | HR 60 | Resp 16 | Ht 62.0 in | Wt 138.0 lb

## 2015-01-28 DIAGNOSIS — R001 Bradycardia, unspecified: Secondary | ICD-10-CM | POA: Diagnosis not present

## 2015-01-28 DIAGNOSIS — I10 Essential (primary) hypertension: Secondary | ICD-10-CM

## 2015-01-28 DIAGNOSIS — E785 Hyperlipidemia, unspecified: Secondary | ICD-10-CM | POA: Diagnosis not present

## 2015-01-28 DIAGNOSIS — Z79899 Other long term (current) drug therapy: Secondary | ICD-10-CM

## 2015-01-28 DIAGNOSIS — I442 Atrioventricular block, complete: Secondary | ICD-10-CM

## 2015-01-28 DIAGNOSIS — Z950 Presence of cardiac pacemaker: Secondary | ICD-10-CM

## 2015-01-28 NOTE — Progress Notes (Signed)
Patient ID: [name], female   DOB: Apr 09, 1951, 64 y.o.   MRN: [id number]     Cardiology Office Note   Date:  01/30/2015   ID:  [name], DOB 1950/11/06, MRN [id number]  PCP:  Purvis Kilts, MD  Cardiologist:  Quay Burow, MD;  Sanda Klein, MD   Chief Complaint  Patient presents with  . Over due pacer check    Patient has had some back pain.      History of Present Illness: [name] is a 64 y.o. female who presents for  Pacemaker check. Her device was implanted for symptomatic bradycardia and she has a history of left bundle branch block. She subsequently has developed complete heart block. Her Medtronic Adapta device was implanted in 2011 and has roughly 8.5 years of estimated generator longevity remaining. There is 11% atrial pacing and 100% ventricular pacing.  Lead parameters are good. Her device recorded one episode of atrial fibrillation that occurred on 06/18/2014 and lasted for 1 hour. Other times she has had regular paroxysmal atrial tachycardia. As far as I can tell, this is the only episode of atrial fibrillation that she has ever had. She remembers the holidays as being hectic but does not recall palpitations or other complaints around that time. In fact she has no cardiac complaints whatsoever today. She does complain of back pain.    Past Medical History  Diagnosis Date  . Arthritis   . Hypertension   . Osteopenia 09/2013    T score -1.9   . Bundle branch block left     Dr. Rollene Fare is cardiologist  . Elevated cholesterol   . Wears glasses   . Pacemaker     Past Surgical History  Procedure Laterality Date  . Pacemaker insertion  07/2009    Dr. Rollene Fare  Pacemaker is a Adapta L implanted 07/2009  . Colonoscopy    . Carpal tunnel release  04/30/2011    Procedure: CARPAL TUNNEL RELEASE;  Surgeon: Cammie Sickle., MD;  Location: [facility];  Service: Orthopedics;  Laterality: Left;  left carpal tunnel release, inject left  thumb  . Dorsal compartment release  04/30/2011    Procedure: RELEASE DORSAL COMPARTMENT (DEQUERVAIN);  Surgeon: Cammie Sickle., MD;  Location: [facility];  Service: Orthopedics;  Laterality: Left;  release 1st dorsal compartment on left  . Tubal ligation  1986  . Colonoscopy  04/14/2012    Procedure: COLONOSCOPY;  Surgeon: Daneil Dolin, MD;  [facility];  Service: Endoscopy;  Laterality: N/A;  8:30 AM  . Carpal tunnel release  04/28/2012    Procedure: CARPAL TUNNEL RELEASE;  Surgeon: Cammie Sickle., MD;  Location: [facility] [place];  Service: Orthopedics;  Laterality: Right;  . Transthoracic echocardiogram  10/18/2012    Atrial tracking & V paced rhythm with pacer induced LBBB. No significant intra -ventricular  dyssynchrony.  . Cardiac catheterization  2005    Ejection fraction is estimated at  greater than or equal to 60 %  Wall motion is normal.  . Renal circulation  07/ 08/ 08    normal renal duplex doppler   . Carotid duplex bilateral  march 08,2005    Impression : No evidence of of signficant plaque formation or stenosis.  . Insert / replace / remove pacemaker  07/29/2009    PPM-Dual Chamber Systems Fluoro Guidance Venogram performed 10 ml 's to the left subclavian vein     Current  Outpatient Prescriptions  Medication Sig Dispense Refill  . aspirin 81 MG chewable tablet Chew 81 mg by mouth daily.      . calcium carbonate (OS-CAL) 600 MG TABS Take 600 mg by mouth 2 (two) times daily with a meal.      . Glucosamine-Chondroit-Vit C-Mn (GLUCOSAMINE 1500 COMPLEX PO) Take 2 tablets by mouth daily.     Marland Kitchen ibuprofen (ADVIL,MOTRIN) 200 MG tablet Take 200 mg by mouth every 6 (six) hours as needed. Just occasional    . Krill Oil 500 MG CAPS Take 1 capsule by mouth 2 (two) times daily.     Marland Kitchen lisinopril-hydrochlorothiazide (PRINZIDE,ZESTORETIC) 20-12.5 MG per tablet Take 1 tablet by mouth daily.      . metoprolol succinate (TOPROL-XL) 50 MG 24 hr  tablet TAKE ONE TABLET BY MOUTH ONCE DAILY 30 tablet 5  . Multiple Vitamin (MULTIVITAMIN) capsule Take 1 capsule by mouth daily.      . naproxen sodium (ALEVE) 220 MG tablet Take 220 mg by mouth daily as needed (pain).    Marland Kitchen oxymetazoline (AFRIN) 0.05 % nasal spray Place 2 sprays into the nose at bedtime.    . simvastatin (ZOCOR) 40 MG tablet TAKE ONE TABLET BY MOUTH AT BEDTIME 30 tablet 6  . venlafaxine (EFFEXOR) 75 MG tablet Take 1 tablet (75 mg total) by mouth 2 (two) times daily. (Patient taking differently: Take 75 mg by mouth 2 (two) times daily. Take every 2-3 days.) 30 tablet 12   No current facility-administered medications for this visit.    Allergies:   Vicodin    Social History:  The patient  reports that she has quit smoking. Her smoking use included Cigarettes. She has a 45 pack-year smoking history. She has never used smokeless tobacco. She reports that she drinks about 4.2 oz of alcohol per week. She reports that she does not use illicit drugs.   Family History:  The patient's family history includes Colon cancer in her maternal aunt; Diabetes in her father; Heart disease in her mother and paternal grandfather.    ROS:  Please see the history of present illness.    Otherwise, review of systems positive for none.   All other systems are reviewed and negative.    PHYSICAL EXAM: VS:  BP 138/90 mmHg  Pulse 60  Resp 16  Ht 5\' 2"  (1.575 m)  Wt 138 lb (62.596 kg)  BMI 25.23 kg/m2 , BMI Body mass index is 25.23 kg/(m^2).  General: Alert, oriented x3, no distress Head: no evidence of trauma, PERRL, EOMI, no exophtalmos or lid lag, no myxedema, no xanthelasma; normal ears, nose and oropharynx Neck: normal jugular venous pulsations and no hepatojugular reflux; brisk carotid pulses without delay and no carotid bruits Chest: clear to auscultation, no signs of consolidation by percussion or palpation, normal fremitus, symmetrical and full respiratory excursions,  Healthy subclavian  pacemaker site Cardiovascular: normal position and quality of the apical impulse, regular rhythm, normal first and paradoxically split second heart sounds, no  murmurs, rubs or gallops Abdomen: no tenderness or distention, no masses by palpation, no abnormal pulsatility or arterial bruits, normal bowel sounds, no hepatosplenomegaly Extremities: no clubbing, cyanosis or edema; 2+ radial, ulnar and brachial pulses bilaterally; 2+ right femoral, posterior tibial and dorsalis pedis pulses; 2+ left femoral, posterior tibial and dorsalis pedis pulses; no subclavian or femoral bruits Neurological: grossly nonfocal Psych: euthymic mood, full affect   EKG:  EKG is ordered today. The ekg ordered today demonstrates  100% atrial ventricular sequential  pacing   Recent Labs: 07/01/2014: ALT 23    Lipid Panel    Component Value Date/Time   CHOL 147 02/28/2014 0840   TRIG 115 02/28/2014 0840   HDL 53 02/28/2014 0840   CHOLHDL 2.8 02/28/2014 0840   VLDL 23 02/28/2014 0840   LDLCALC 71 02/28/2014 0840      Wt Readings from Last 3 Encounters:  01/28/15 138 lb (62.596 kg)  12/04/14 135 lb (61.236 kg)  05/28/14 137 lb (62.143 kg)     ASSESSMENT AND PLAN:  1. Complete heart block, pacemaker dependent 2. Normally functioning dual-chamber permanent pacemaker 3.  Asymptomatic single episode of paroxysmal atrial fibrillation that occurred roughly 7 months ago.  Her risk factors are limited to hypertension and female gender and the fact that she turned 11 less than 2 months ago.Marland Kitchen CHADSVasc score is technically 3, but her overall embolic risk appears to be low. She has only had the one episode of atrial fibrillation. Will discuss with Dr. Gwenlyn Found whether or not it would be justified to place her on full dose anticoagulation. For the time being she is taking aspirin. 4. Essential hypertension well controlled    Current medicines are reviewed at length with the patient today.  The patient does not have  concerns regarding medicines.  The following changes have been made:  no change  Labs/ tests ordered today include:  Orders Placed This Encounter  Procedures  . CBC  . Comprehensive metabolic panel  . Lipid panel  . Implantable device check  . EKG 12-Lead    Patient Instructions  Remote monitoring is used to monitor your Pacemaker from home. This monitoring reduces the number of office visits required to check your device to one time per year. It allows [place] to monitor the functioning of your device to ensure it is working properly. You are scheduled for a device check from home on May 01, 2015. You may send your transmission at any time that day. If you have a wireless device, the transmission will be sent automatically. After your physician reviews your transmission, you will receive a postcard with your next transmission date.  Dr. Sallyanne Kuster recommends that you schedule a follow-up appointment in: [place] ([place] ([place]).        Mikael Spray, MD  01/30/2015 2:27 PM    Sanda Klein, MD, [facility] [phone] office [phone] pager

## 2015-01-28 NOTE — Patient Instructions (Signed)
Remote monitoring is used to monitor your Pacemaker from home. This monitoring reduces the number of office visits required to check your device to one time per year. It allows [place] to monitor the functioning of your device to ensure it is working properly. You are scheduled for a device check from home on May 01, 2015. You may send your transmission at any time that day. If you have a wireless device, the transmission will be sent automatically. After your physician reviews your transmission, you will receive a postcard with your next transmission date.  Dr. Sallyanne Kuster recommends that you schedule a follow-up appointment in: [place] ([place] ([place]).

## 2015-01-29 LAB — CUP PACEART INCLINIC DEVICE CHECK
Battery Impedance: 350 Ohm
Battery Remaining Longevity: 101 mo
Battery Voltage: 2.8 V
Brady Statistic AP VP Percent: 11 %
Brady Statistic AP VS Percent: 0 %
Brady Statistic AS VS Percent: 0 %
Lead Channel Impedance Value: 465 Ohm
Lead Channel Pacing Threshold Pulse Width: 0.4 ms
Lead Channel Pacing Threshold Pulse Width: 0.4 ms
Lead Channel Sensing Intrinsic Amplitude: 0.7 mV
Lead Channel Setting Pacing Amplitude: 1.5 V
Lead Channel Setting Pacing Amplitude: 2.25 V
Lead Channel Setting Sensing Sensitivity: 2.8 mV
MDC IDC MSMT LEADCHNL RA IMPEDANCE VALUE: 456 Ohm
MDC IDC MSMT LEADCHNL RA PACING THRESHOLD AMPLITUDE: 0.375 V
MDC IDC MSMT LEADCHNL RV PACING THRESHOLD AMPLITUDE: 0.75 V
MDC IDC SESS DTM: 20160809105029
MDC IDC SET LEADCHNL RV PACING PULSEWIDTH: 0.4 ms
MDC IDC STAT BRADY AS VP PERCENT: 89 %

## 2015-01-30 DIAGNOSIS — R001 Bradycardia, unspecified: Secondary | ICD-10-CM | POA: Insufficient documentation

## 2015-01-30 DIAGNOSIS — I442 Atrioventricular block, complete: Secondary | ICD-10-CM | POA: Insufficient documentation

## 2015-02-07 ENCOUNTER — Other Ambulatory Visit (HOSPITAL_COMMUNITY): Payer: Self-pay | Admitting: Family Medicine

## 2015-02-07 DIAGNOSIS — Z1231 Encounter for screening mammogram for malignant neoplasm of breast: Secondary | ICD-10-CM

## 2015-02-19 ENCOUNTER — Ambulatory Visit: Payer: Managed Care, Other (non HMO) | Admitting: Cardiovascular Disease

## 2015-03-14 ENCOUNTER — Other Ambulatory Visit: Payer: Self-pay | Admitting: Cardiovascular Disease

## 2015-03-14 NOTE — Telephone Encounter (Signed)
REFILL 

## 2015-03-20 ENCOUNTER — Other Ambulatory Visit: Payer: Self-pay | Admitting: Cardiovascular Disease

## 2015-03-21 NOTE — Telephone Encounter (Signed)
Rx(s) sent to pharmacy electronically.  

## 2015-03-24 ENCOUNTER — Ambulatory Visit (HOSPITAL_COMMUNITY)
Admission: RE | Admit: 2015-03-24 | Discharge: 2015-03-24 | Disposition: A | Discharge status: Home / Self Care | Payer: Medicare Other | Source: Ambulatory Visit | Attending: Family Medicine | Admitting: Family Medicine

## 2015-03-24 DIAGNOSIS — Z1231 Encounter for screening mammogram for malignant neoplasm of breast: Secondary | ICD-10-CM

## 2015-03-27 DIAGNOSIS — E782 Mixed hyperlipidemia: Secondary | ICD-10-CM | POA: Diagnosis not present

## 2015-03-27 DIAGNOSIS — Z23 Encounter for immunization: Secondary | ICD-10-CM | POA: Diagnosis not present

## 2015-03-27 DIAGNOSIS — I442 Atrioventricular block, complete: Secondary | ICD-10-CM | POA: Diagnosis not present

## 2015-03-27 DIAGNOSIS — Z6825 Body mass index (BMI) 25.0-25.9, adult: Secondary | ICD-10-CM | POA: Diagnosis not present

## 2015-03-27 DIAGNOSIS — M899 Disorder of bone, unspecified: Secondary | ICD-10-CM | POA: Diagnosis not present

## 2015-03-27 DIAGNOSIS — I10 Essential (primary) hypertension: Secondary | ICD-10-CM | POA: Diagnosis not present

## 2015-03-27 DIAGNOSIS — M19049 Primary osteoarthritis, unspecified hand: Secondary | ICD-10-CM | POA: Diagnosis not present

## 2015-03-27 DIAGNOSIS — Z0001 Encounter for general adult medical examination with abnormal findings: Secondary | ICD-10-CM | POA: Diagnosis not present

## 2015-03-28 DIAGNOSIS — Z0000 Encounter for general adult medical examination without abnormal findings: Secondary | ICD-10-CM | POA: Diagnosis not present

## 2015-03-28 DIAGNOSIS — I10 Essential (primary) hypertension: Secondary | ICD-10-CM | POA: Diagnosis not present

## 2015-03-28 DIAGNOSIS — E785 Hyperlipidemia, unspecified: Secondary | ICD-10-CM | POA: Diagnosis not present

## 2015-03-28 DIAGNOSIS — Z79899 Other long term (current) drug therapy: Secondary | ICD-10-CM | POA: Diagnosis not present

## 2015-03-28 DIAGNOSIS — E663 Overweight: Secondary | ICD-10-CM | POA: Diagnosis not present

## 2015-03-28 LAB — CBC
HCT: 41.5 % (ref 36.0–46.0)
Hemoglobin: 14 g/dL (ref 12.0–15.0)
MCH: 33.7 pg (ref 26.0–34.0)
MCHC: 33.7 g/dL (ref 30.0–36.0)
MCV: 100 fL (ref 78.0–100.0)
MPV: 10.1 fL (ref 8.6–12.4)
Platelets: 210 10*3/uL (ref 150–400)
RBC: 4.15 MIL/uL (ref 3.87–5.11)
RDW: 12.6 % (ref 11.5–15.5)
WBC: 7.7 10*3/uL (ref 4.0–10.5)

## 2015-03-29 LAB — LIPID PANEL
CHOL/HDL RATIO: 3.3 ratio (ref <=5.0)
Cholesterol: 169 mg/dL (ref 125–200)
HDL: 51 mg/dL (ref >=46)
LDL CALC: 91 mg/dL (ref <130)
Triglycerides: 136 mg/dL (ref <150)
VLDL: 27 mg/dL (ref <30)

## 2015-03-29 LAB — COMPREHENSIVE METABOLIC PANEL
ALT: 17 U/L (ref 6–29)
AST: 22 U/L (ref 10–35)
Albumin: 4.2 g/dL (ref 3.6–5.1)
Alkaline Phosphatase: 67 U/L (ref 33–130)
BUN: 17 mg/dL (ref 7–25)
CO2: 31 mmol/L (ref 20–31)
Calcium: 9.4 mg/dL (ref 8.6–10.4)
Chloride: 102 mmol/L (ref 98–110)
Creat: 0.82 mg/dL (ref 0.50–0.99)
Glucose, Bld: 100 mg/dL — ABNORMAL HIGH (ref 65–99)
Potassium: 4.2 mmol/L (ref 3.5–5.3)
Sodium: 140 mmol/L (ref 135–146)
TOTAL PROTEIN: 6.8 g/dL (ref 6.1–8.1)
Total Bilirubin: 0.7 mg/dL (ref 0.2–1.2)

## 2015-04-29 ENCOUNTER — Ambulatory Visit (INDEPENDENT_AMBULATORY_CARE_PROVIDER_SITE_OTHER): Payer: Medicare Other | Admitting: *Deleted

## 2015-04-29 DIAGNOSIS — R001 Bradycardia, unspecified: Secondary | ICD-10-CM | POA: Diagnosis not present

## 2015-04-29 NOTE — Progress Notes (Signed)
Remote pacemaker transmission.   

## 2015-05-01 LAB — CUP PACEART REMOTE DEVICE CHECK
Battery Impedance: 375 Ohm
Brady Statistic AP VP Percent: 11 %
Brady Statistic AP VS Percent: 0 %
Brady Statistic AS VP Percent: 89 %
Brady Statistic AS VS Percent: 0 %
Implantable Lead Implant Date: 20110208
Implantable Lead Location: 753859
Implantable Lead Location: 753860
Implantable Lead Model: 5076
Lead Channel Impedance Value: 455 Ohm
Lead Channel Impedance Value: 483 Ohm
Lead Channel Pacing Threshold Amplitude: 0.375 V
Lead Channel Pacing Threshold Amplitude: 0.625 V
Lead Channel Sensing Intrinsic Amplitude: 2.8 mV
MDC IDC LEAD IMPLANT DT: 20110208
MDC IDC MSMT BATTERY REMAINING LONGEVITY: 100 mo
MDC IDC MSMT BATTERY VOLTAGE: 2.8 V
MDC IDC MSMT LEADCHNL RA PACING THRESHOLD PULSEWIDTH: 0.4 ms
MDC IDC MSMT LEADCHNL RV PACING THRESHOLD PULSEWIDTH: 0.4 ms
MDC IDC SESS DTM: 20161108134732
MDC IDC SET LEADCHNL RA PACING AMPLITUDE: 1.5 V
MDC IDC SET LEADCHNL RV PACING AMPLITUDE: 2.25 V
MDC IDC SET LEADCHNL RV PACING PULSEWIDTH: 0.4 ms
MDC IDC SET LEADCHNL RV SENSING SENSITIVITY: 2.8 mV

## 2015-05-02 ENCOUNTER — Encounter: Payer: Self-pay | Admitting: Cardiology

## 2015-05-26 DIAGNOSIS — E663 Overweight: Secondary | ICD-10-CM | POA: Diagnosis not present

## 2015-05-26 DIAGNOSIS — J069 Acute upper respiratory infection, unspecified: Secondary | ICD-10-CM | POA: Diagnosis not present

## 2015-05-26 DIAGNOSIS — Z6826 Body mass index (BMI) 26.0-26.9, adult: Secondary | ICD-10-CM | POA: Diagnosis not present

## 2015-05-26 DIAGNOSIS — Z1389 Encounter for screening for other disorder: Secondary | ICD-10-CM | POA: Diagnosis not present

## 2015-06-20 ENCOUNTER — Other Ambulatory Visit: Payer: Self-pay | Admitting: Cardiovascular Disease

## 2015-06-20 NOTE — Telephone Encounter (Signed)
Rx request sent to pharmacy.  

## 2015-07-29 ENCOUNTER — Ambulatory Visit (INDEPENDENT_AMBULATORY_CARE_PROVIDER_SITE_OTHER): Payer: Medicare Other | Admitting: *Deleted

## 2015-07-29 DIAGNOSIS — I442 Atrioventricular block, complete: Secondary | ICD-10-CM

## 2015-07-29 NOTE — Progress Notes (Signed)
Remote pacemaker transmission.   

## 2015-08-14 ENCOUNTER — Ambulatory Visit (INDEPENDENT_AMBULATORY_CARE_PROVIDER_SITE_OTHER): Payer: Medicare Other | Admitting: Podiatry

## 2015-08-14 ENCOUNTER — Encounter: Payer: Self-pay | Admitting: Podiatry

## 2015-08-14 VITALS — BP 125/64 | HR 60 | Resp 16

## 2015-08-14 DIAGNOSIS — B351 Tinea unguium: Secondary | ICD-10-CM

## 2015-08-14 NOTE — Progress Notes (Signed)
She presents today for follow-up of her onychomycosis. She states that she doesn't medicine over a year ago for this and it cleared up quite nicely. She thinks that it has returned at this point.  Objective: Vital signs are stable she is alert and oriented 3 to have reviewed her past medical history medications allergies surgery social history and review of systems. Pulses are palpable bilateral foot. Nail plates appear to be relatively normal with exception of thickening toenails to hammertoes which appears to be morbidly dystrophic nail rather than onychomycosis. There is some blood beneath the nail plates which is indicative of irritation and now dystrophy. The hallux nail appears to be in pretty good condition with maybe some mild thickening of the distal most aspect.  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 Cannot rule out onychomycosis to the hallux right.  Plan: We discussed the etiology pathology conservative versus surgical therapies. I encouraged her to debride the nails thickness and in length. I offered her more medication but she declined. I will follow up with her on an as-needed basis.

## 2015-08-21 DIAGNOSIS — J209 Acute bronchitis, unspecified: Secondary | ICD-10-CM | POA: Diagnosis not present

## 2015-08-21 DIAGNOSIS — Z1389 Encounter for screening for other disorder: Secondary | ICD-10-CM | POA: Diagnosis not present

## 2015-08-21 DIAGNOSIS — Z6825 Body mass index (BMI) 25.0-25.9, adult: Secondary | ICD-10-CM | POA: Diagnosis not present

## 2015-08-21 DIAGNOSIS — E663 Overweight: Secondary | ICD-10-CM | POA: Diagnosis not present

## 2015-08-23 LAB — CUP PACEART REMOTE DEVICE CHECK
Battery Impedance: 447 Ohm
Brady Statistic AP VP Percent: 10 %
Date Time Interrogation Session: 20170207141435
Implantable Lead Implant Date: 20110208
Implantable Lead Location: 753860
Implantable Lead Model: 5076
Implantable Lead Model: 5076
Lead Channel Impedance Value: 455 Ohm
Lead Channel Impedance Value: 492 Ohm
MDC IDC LEAD IMPLANT DT: 20110208
MDC IDC LEAD LOCATION: 753859
MDC IDC MSMT BATTERY REMAINING LONGEVITY: 93 mo
MDC IDC MSMT BATTERY VOLTAGE: 2.8 V
MDC IDC SET LEADCHNL RA PACING AMPLITUDE: 1.5 V
MDC IDC SET LEADCHNL RV PACING AMPLITUDE: 2.25 V
MDC IDC SET LEADCHNL RV PACING PULSEWIDTH: 0.4 ms
MDC IDC SET LEADCHNL RV SENSING SENSITIVITY: 2.8 mV
MDC IDC STAT BRADY AP VS PERCENT: 0 %
MDC IDC STAT BRADY AS VP PERCENT: 90 %
MDC IDC STAT BRADY AS VS PERCENT: 0 %

## 2015-08-23 NOTE — Progress Notes (Signed)
Normal remote reviewed. Most node switch episodes appear to be AT 1 episode with A rate >400 that lasted 1 hour.  CHADS2VASC is 3 - known issue at last office visit.   Next Carelink 10/28/15

## 2015-08-24 ENCOUNTER — Encounter: Payer: Self-pay | Admitting: Cardiology

## 2015-09-16 ENCOUNTER — Encounter: Payer: Self-pay | Admitting: Cardiovascular Disease

## 2015-09-16 ENCOUNTER — Ambulatory Visit (INDEPENDENT_AMBULATORY_CARE_PROVIDER_SITE_OTHER): Payer: Medicare Other | Admitting: Cardiovascular Disease

## 2015-09-16 DIAGNOSIS — I10 Essential (primary) hypertension: Secondary | ICD-10-CM

## 2015-09-16 DIAGNOSIS — R079 Chest pain, unspecified: Secondary | ICD-10-CM | POA: Diagnosis not present

## 2015-09-16 NOTE — Progress Notes (Signed)
09/16/2015 [name]   28-Jul-1950  [id number]  Primary Physician Purvis Kilts, MD Primary Cardiologist: Lorretta Harp MD Renae Gloss   HPI:  Ms. [name] is a very pleasant fit-appearing 65 year old married Caucasian female mother of 2, grandmother and 2 grandchildren who is retired Dance movement psychotherapist who is accompanied by her husband [name] today.. Her primary care physician is Dr. Hilma Favors in [place]. I am assuming her care from Dr. Rollene Fare who was her previous cardiologist. I last saw her in the office 02/26/14. Her cardiac risk factor profile is positive for 60 pack years of tobacco abuse having quit 10 years ago as was treated hypertension and hyperlipidemia. There is no family history. She has never had a heart attack or stroke. She had a normal cath in 2005. 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 She had negative Myoview stress test 08/30/13 for scapular pain which subsequently subsided. She's had recurrent back, shoulder and chest pain the last several weeks and months. She also has had episodes of atrial tachycardia demonstrated on pacemaker interrogation with one episode of PAF.   Current Outpatient Prescriptions  Medication Sig Dispense Refill  . aspirin 81 MG tablet Take 81 mg by mouth daily.    . calcium carbonate (OS-CAL) 600 MG TABS Take 600 mg by mouth 2 (two) times daily with a meal.      . ibuprofen (ADVIL,MOTRIN) 200 MG tablet Take 200 mg by mouth every 6 (six) hours as needed. Just occasional    . lisinopril-hydrochlorothiazide (PRINZIDE,ZESTORETIC) 20-12.5 MG per tablet Take 1 tablet by mouth daily.      . metoprolol succinate (TOPROL-XL) 50 MG 24 hr tablet TAKE ONE TABLET BY MOUTH ONCE DAILY 30 tablet 10  . Multiple Vitamin (MULTIVITAMIN) capsule Take 1 capsule by mouth daily.      . naproxen sodium (ALEVE) 220 MG tablet Take 220 mg by mouth daily as needed (pain).    Marland Kitchen OVER THE COUNTER MEDICATION Take 1 tablet by mouth  2 (two) times daily. Med Name: [place]    . oxymetazoline (AFRIN) 0.05 % nasal spray Place 2 sprays into the nose at bedtime.    . simvastatin (ZOCOR) 40 MG tablet TAKE ONE TABLET BY MOUTH ONCE DAILY AT BEDTIME 30 tablet 9   No current facility-administered medications for this visit.    Allergies  Allergen Reactions  . Vicodin [Hydrocodone-Acetaminophen] Nausea Only    Social History   Social History  . Marital Status: Married    Spouse Name: N/A  . Number of Children: N/A  . Years of Education: N/A   Occupational History  . Not on file.   Social History Main Topics  . Smoking status: Former Smoker -- 1.50 packs/day for 30 years    Types: Cigarettes  . Smokeless tobacco: Never Used  . Alcohol Use: 4.2 oz/week    7 Glasses of wine per week  . Drug Use: No  . Sexual Activity: No     Comment: Tubal lig-1st intercourse 65 yo-Fewer than 5 partners   Other Topics Concern  . Not on file   Social History Narrative     Review of Systems: General: negative for chills, fever, night sweats or weight changes.  Cardiovascular: negative for chest pain, dyspnea on exertion, edema, orthopnea, palpitations, paroxysmal nocturnal dyspnea or shortness of breath Dermatological: negative for rash Respiratory: negative for cough or wheezing Urologic: negative for hematuria Abdominal: negative for nausea, vomiting, diarrhea, bright  red blood per rectum, melena, or hematemesis Neurologic: negative for visual changes, syncope, or dizziness All other systems reviewed and are otherwise negative except as noted above.    Blood pressure 110/72, pulse 79, height 5\' 2"  (1.575 m), weight 133 lb 12.8 oz (60.691 kg).  General appearance: alert and no distress Neck: no adenopathy, no carotid bruit, no JVD, supple, symmetrical, trachea midline and thyroid not enlarged, symmetric, no tenderness/mass/nodules Lungs: clear to auscultation bilaterally Heart: regular rate and rhythm,  S1, S2 normal, no murmur, click, rub or gallop Extremities: extremities normal, atraumatic, no cyanosis or edema  EKG not performed today  ASSESSMENT AND PLAN:   Hypertension History of hypertension blood pressure measured today at 110/72. She is on lisinopril, hydrochlorothiazide and metoprolol. Continue current meds at current dosing  Elevated cholesterol History of hyperlipidemia on simvastatin with recent lipid profile performed 03/28/15 revealed total cholesterol 169, LDL of 91 and HDL of 51.  Chest pain History of atypical back shoulder and chest pain. She does have a normal cardiac catheterization back in 2005. Risk factors include hypertension and hyperlipidemia. I'm going to get a pharmacologic Myoview stress test to rule out an ischemic etiology since it's been 12 years since her cardiac catheterization.  History of permanent cardiac pacemaker placement History of permanent transfer venous pacemaker insertion back in 2011 by Dr. Jacques Earthly because of symptomatically bradycardia. This started by Dr. Sallyanne Kuster on an annual basis with quarterly CareLink checks. She does have episodes of atrial tachycardia and one episode of PAF in the past. The CHA2DSVASC2 score is 3   . I re-interrogated her pacemaker today and there have been no episodes of PAF since she saw Dr. Sallyanne Kuster back in June of last year. I do not think she needs to be started on oral Levaquin at this time.      Lorretta Harp MD FACP,FACC,FAHA, [facility] 09/16/2015 9:56 AM

## 2015-09-16 NOTE — Assessment & Plan Note (Signed)
History of hyperlipidemia on simvastatin with recent lipid profile performed 03/28/15 revealed total cholesterol 169, LDL of 91 and HDL of 51.

## 2015-09-16 NOTE — Assessment & Plan Note (Signed)
History of hypertension blood pressure measured today at 110/72. She is on lisinopril, hydrochlorothiazide and metoprolol. Continue current meds at current dosing

## 2015-09-16 NOTE — Assessment & Plan Note (Signed)
History of atypical back shoulder and chest pain. She does have a normal cardiac catheterization back in 2005. Risk factors include hypertension and hyperlipidemia. I'm going to get a pharmacologic Myoview stress test to rule out an ischemic etiology since it's been 12 years since her cardiac catheterization.

## 2015-09-16 NOTE — Assessment & Plan Note (Signed)
History of permanent transfer venous pacemaker insertion back in 2011 by Dr. Jacques Earthly because of symptomatically bradycardia. This started by Dr. Sallyanne Kuster on an annual basis with quarterly CareLink checks. She does have episodes of atrial tachycardia and one episode of PAF in the past. The CHA2DSVASC2 score is 3   . I re-interrogated her pacemaker today and there have been no episodes of PAF since she saw Dr. Sallyanne Kuster back in June of last year. I do not think she needs to be started on oral Levaquin at this time.

## 2015-09-16 NOTE — Patient Instructions (Signed)
Medication Instructions:  Your physician recommends that you continue on your current medications as directed. Please refer to the Current Medication list given to you today.   Labwork: I will get your lab work from your Primary Care Physician.  Testing/Procedures: Your physician has requested that you have a lexiscan myoview. For further information please visit [email or web address]. Please follow instruction sheet, as given.    Follow-Up: Your physician wants you to follow-up in: 12 months with Dr. Gwenlyn Found. You will receive a reminder letter in the mail two months in advance. If you don't receive a letter, please call our office to schedule the follow-up appointment.   Any Other Special Instructions Will Be Listed Below (If Applicable).     If you need a refill on your cardiac medications before your next appointment, please call your pharmacy.

## 2015-09-18 DIAGNOSIS — H5203 Hypermetropia, bilateral: Secondary | ICD-10-CM | POA: Diagnosis not present

## 2015-09-18 DIAGNOSIS — H524 Presbyopia: Secondary | ICD-10-CM | POA: Diagnosis not present

## 2015-09-18 DIAGNOSIS — E119 Type 2 diabetes mellitus without complications: Secondary | ICD-10-CM | POA: Diagnosis not present

## 2015-09-18 DIAGNOSIS — H52223 Regular astigmatism, bilateral: Secondary | ICD-10-CM | POA: Diagnosis not present

## 2015-09-22 ENCOUNTER — Encounter: Payer: Self-pay | Admitting: Cardiovascular Disease

## 2015-09-24 ENCOUNTER — Telehealth (HOSPITAL_COMMUNITY): Payer: Self-pay

## 2015-09-24 NOTE — Telephone Encounter (Signed)
Encounter complete. 

## 2015-09-25 ENCOUNTER — Ambulatory Visit (HOSPITAL_COMMUNITY)
Admission: RE | Admit: 2015-09-25 | Discharge: 2015-09-25 | Disposition: A | Discharge status: Home / Self Care | Payer: Medicare Other | Source: Ambulatory Visit | Attending: Cardiovascular Disease | Admitting: Cardiovascular Disease

## 2015-09-25 DIAGNOSIS — R079 Chest pain, unspecified: Secondary | ICD-10-CM

## 2015-10-14 ENCOUNTER — Telehealth: Payer: Self-pay

## 2015-10-14 DIAGNOSIS — R079 Chest pain, unspecified: Secondary | ICD-10-CM

## 2015-10-14 NOTE — Telephone Encounter (Signed)
Pt orders updated to get recheduled

## 2015-10-14 NOTE — Telephone Encounter (Signed)
Left message on home and cell phone voice mail for patient to call and reschedule lexiscan myoview from 09/25/15

## 2015-10-23 ENCOUNTER — Telehealth (HOSPITAL_COMMUNITY): Payer: Self-pay

## 2015-10-23 NOTE — Telephone Encounter (Signed)
Encounter complete. 

## 2015-10-24 ENCOUNTER — Telehealth (HOSPITAL_COMMUNITY): Payer: Self-pay

## 2015-10-24 NOTE — Telephone Encounter (Signed)
Encounter complete. 

## 2015-10-28 ENCOUNTER — Ambulatory Visit (HOSPITAL_COMMUNITY)
Admission: RE | Admit: 2015-10-28 | Discharge: 2015-10-28 | Disposition: A | Discharge status: Home / Self Care | Payer: Medicare Other | Source: Ambulatory Visit | Attending: Cardiovascular Disease | Admitting: Cardiovascular Disease

## 2015-10-28 ENCOUNTER — Ambulatory Visit (INDEPENDENT_AMBULATORY_CARE_PROVIDER_SITE_OTHER): Payer: Medicare Other | Admitting: *Deleted

## 2015-10-28 ENCOUNTER — Telehealth: Payer: Self-pay | Admitting: Cardiology

## 2015-10-28 DIAGNOSIS — Z6824 Body mass index (BMI) 24.0-24.9, adult: Secondary | ICD-10-CM | POA: Diagnosis not present

## 2015-10-28 DIAGNOSIS — Z8249 Family history of ischemic heart disease and other diseases of the circulatory system: Secondary | ICD-10-CM | POA: Insufficient documentation

## 2015-10-28 DIAGNOSIS — I10 Essential (primary) hypertension: Secondary | ICD-10-CM | POA: Diagnosis not present

## 2015-10-28 DIAGNOSIS — Z87891 Personal history of nicotine dependence: Secondary | ICD-10-CM | POA: Insufficient documentation

## 2015-10-28 DIAGNOSIS — I442 Atrioventricular block, complete: Secondary | ICD-10-CM

## 2015-10-28 DIAGNOSIS — I447 Left bundle-branch block, unspecified: Secondary | ICD-10-CM | POA: Insufficient documentation

## 2015-10-28 DIAGNOSIS — R079 Chest pain, unspecified: Secondary | ICD-10-CM

## 2015-10-28 DIAGNOSIS — Z950 Presence of cardiac pacemaker: Secondary | ICD-10-CM | POA: Diagnosis not present

## 2015-10-28 DIAGNOSIS — E669 Obesity, unspecified: Secondary | ICD-10-CM | POA: Insufficient documentation

## 2015-10-28 LAB — MYOCARDIAL PERFUSION IMAGING
CHL CUP NUCLEAR SSS: 8
CHL CUP RESTING HR STRESS: 60 {beats}/min
CSEPPHR: 105 {beats}/min
LV dias vol: 81 mL (ref 46–106)
LVSYSVOL: 32 mL
SDS: 3
SRS: 5
TID: 1.16

## 2015-10-28 MED ORDER — TECHNETIUM TC 99M SESTAMIBI GENERIC - CARDIOLITE
10.2000 | Freq: Once | INTRAVENOUS | Status: AC | PRN
  Administered 2015-10-28: 10.2 via INTRAVENOUS

## 2015-10-28 MED ORDER — TECHNETIUM TC 99M SESTAMIBI GENERIC - CARDIOLITE
30.1000 | Freq: Once | INTRAVENOUS | Status: AC | PRN
  Administered 2015-10-28: 30.1 via INTRAVENOUS

## 2015-10-28 MED ORDER — REGADENOSON 0.4 MG/5ML IV SOLN
0.4000 mg | Freq: Once | INTRAVENOUS | Status: AC
Start: 2015-10-28 — End: 2015-10-28
  Administered 2015-10-28: 0.4 mg via INTRAVENOUS

## 2015-10-28 MED ORDER — AMINOPHYLLINE 25 MG/ML IV SOLN
75.0000 mg | Freq: Once | INTRAVENOUS | Status: AC
Start: 2015-10-28 — End: 2015-10-28
  Administered 2015-10-28: 75 mg via INTRAVENOUS

## 2015-10-28 NOTE — Telephone Encounter (Signed)
LMOVM reminding pt to send remote transmission.   

## 2015-10-29 NOTE — Progress Notes (Signed)
Remote pacemaker transmission.   

## 2015-11-27 DIAGNOSIS — Z1389 Encounter for screening for other disorder: Secondary | ICD-10-CM | POA: Diagnosis not present

## 2015-11-27 DIAGNOSIS — Z6826 Body mass index (BMI) 26.0-26.9, adult: Secondary | ICD-10-CM | POA: Diagnosis not present

## 2015-11-27 DIAGNOSIS — M25511 Pain in right shoulder: Secondary | ICD-10-CM | POA: Diagnosis not present

## 2015-11-27 DIAGNOSIS — M064 Inflammatory polyarthropathy: Secondary | ICD-10-CM | POA: Diagnosis not present

## 2015-12-03 ENCOUNTER — Encounter: Payer: Self-pay | Admitting: Cardiology

## 2015-12-03 LAB — CUP PACEART REMOTE DEVICE CHECK
Battery Impedance: 497 Ohm
Battery Voltage: 2.8 V
Brady Statistic AP VS Percent: 0 %
Brady Statistic AS VP Percent: 82 %
Date Time Interrogation Session: 20170510011458
Implantable Lead Implant Date: 20110208
Implantable Lead Location: 753859
Implantable Lead Model: 5076
Implantable Lead Model: 5076
Lead Channel Pacing Threshold Pulse Width: 0.4 ms
Lead Channel Setting Pacing Amplitude: 1.5 V
Lead Channel Setting Pacing Amplitude: 2.25 V
Lead Channel Setting Sensing Sensitivity: 2.8 mV
MDC IDC LEAD IMPLANT DT: 20110208
MDC IDC LEAD LOCATION: 753860
MDC IDC MSMT BATTERY REMAINING LONGEVITY: 89 mo
MDC IDC MSMT LEADCHNL RA IMPEDANCE VALUE: 466 Ohm
MDC IDC MSMT LEADCHNL RA PACING THRESHOLD AMPLITUDE: 0.375 V
MDC IDC MSMT LEADCHNL RA PACING THRESHOLD PULSEWIDTH: 0.4 ms
MDC IDC MSMT LEADCHNL RA SENSING INTR AMPL: 2.8 mV
MDC IDC MSMT LEADCHNL RV IMPEDANCE VALUE: 499 Ohm
MDC IDC MSMT LEADCHNL RV PACING THRESHOLD AMPLITUDE: 0.625 V
MDC IDC SET LEADCHNL RV PACING PULSEWIDTH: 0.4 ms
MDC IDC STAT BRADY AP VP PERCENT: 18 %
MDC IDC STAT BRADY AS VS PERCENT: 0 %

## 2015-12-05 ENCOUNTER — Encounter: Payer: Medicare Other | Admitting: Gynecology

## 2015-12-05 ENCOUNTER — Other Ambulatory Visit (HOSPITAL_COMMUNITY): Payer: Self-pay | Admitting: Registered Nurse

## 2015-12-09 ENCOUNTER — Other Ambulatory Visit (HOSPITAL_COMMUNITY): Payer: Self-pay | Admitting: Registered Nurse

## 2015-12-09 DIAGNOSIS — E2839 Other primary ovarian failure: Secondary | ICD-10-CM

## 2015-12-11 ENCOUNTER — Encounter: Payer: Self-pay | Admitting: Gynecology

## 2015-12-11 ENCOUNTER — Ambulatory Visit (INDEPENDENT_AMBULATORY_CARE_PROVIDER_SITE_OTHER): Payer: Medicare Other | Admitting: Gynecology

## 2015-12-11 VITALS — BP 124/74 | Ht 61.0 in | Wt 132.0 lb

## 2015-12-11 DIAGNOSIS — Z01419 Encounter for gynecological examination (general) (routine) without abnormal findings: Secondary | ICD-10-CM | POA: Diagnosis not present

## 2015-12-11 DIAGNOSIS — L821 Other seborrheic keratosis: Secondary | ICD-10-CM | POA: Diagnosis not present

## 2015-12-11 DIAGNOSIS — M8580 Other specified disorders of bone density and structure, unspecified site: Secondary | ICD-10-CM

## 2015-12-11 DIAGNOSIS — N649 Disorder of breast, unspecified: Secondary | ICD-10-CM

## 2015-12-11 DIAGNOSIS — L988 Other specified disorders of the skin and subcutaneous tissue: Secondary | ICD-10-CM

## 2015-12-11 DIAGNOSIS — N952 Postmenopausal atrophic vaginitis: Secondary | ICD-10-CM

## 2015-12-11 NOTE — Patient Instructions (Addendum)
Office will call you with biopsy results 

## 2015-12-11 NOTE — Progress Notes (Signed)
    [name] 07-12-1950 [id number]        65 y.o.  G2P2002  for breast and pelvic exam. Several issues noted below.  Past medical history,surgical history, problem list, medications, allergies, family history and social history were all reviewed and documented as reviewed in the EPIC chart.  ROS:  Performed with pertinent positives and negatives included in the history, assessment and plan.   Additional significant findings :  None   Exam: [name] assistant Filed Vitals:   12/11/15 1028  BP: 124/74  Height: 5\' 1"  (1.549 m)  Weight: 132 lb (59.875 kg)   General appearance:  Normal affect, orientation and appearance. Skin: Grossly normal HEENT: Without gross lesions.  No cervical or supraclavicular adenopathy. Thyroid normal.  Lungs:  Clear without wheezing, rales or rhonchi Cardiac: RR, without RMG Abdominal:  Soft, nontender, without masses, guarding, rebound, organomegaly or hernia Breasts:  Examined lying and sitting without masses, retractions, discharge or axillary adenopathy.  Raised pigmented skin tag left breast with irritation. Two finger breasts for clock position off area: Physical Exam  Pulmonary/Chest:      Pelvic:  Ext/BUS/vagina with atrophic changes  Cervix with atrophic changes  Uterus anteverted, normal size, shape and contour, midline and mobile nontender   Adnexa without masses or tenderness    Anus and perineum normal   Rectovaginal normal sphincter tone without palpated masses or tenderness.   Procedure: Skin overlying and surrounding the skin tag was cleansed with Betadine, infiltrated with 1% lidocaine and the skin tag was excised in its entirety to the level of the surrounding skin. Several nitrate hemostasis applied. Sterile dressing applied. Postop instructions given.  Assessment/Plan:  65 y.o. G45P2002 female for breast and pelvic exam.   1. Pigmented skin tag left breast. Irritating to the patient. Asked if I would remove it. Removed as  above. Patient will follow up for pathology results. 2. Osteopenia. DEXA 2015 T score -1.9. Had been on Fosamax for approximately 10 years and discontinued last year. Has bone density scheduled tomorrow. 3. Mammography 03/2015. Continue with annual mammography when due. SBE monthly reviewed. 4. Pap smear/HPV 2014. No Pap smear done today. No history of significant abnormal Pap smears. Plan repeat Pap smear next year. 5. Postmenopausal.  Without significant hot flushes, night sweats, vaginal dryness or any vaginal bleeding. Had been on Effexor but off of this now. Had been using intermittent Estrace vaginal cream for external irritation but does not appear to be using it now. We'll continue to monitor. Report any issues or vaginal bleeding. 6. Colonoscopy 2013. Repeat at their recommended interval. 7. Health maintenance. No routine lab work done as this is done elsewhere. Follow up 1 year, sooner as needed.   Anastasio Auerbach MD, 10:55 AM 12/11/2015

## 2015-12-12 ENCOUNTER — Ambulatory Visit (HOSPITAL_COMMUNITY)
Admission: RE | Admit: 2015-12-12 | Discharge: 2015-12-12 | Disposition: A | Discharge status: Home / Self Care | Payer: Medicare Other | Source: Ambulatory Visit | Attending: Registered Nurse | Admitting: Registered Nurse

## 2015-12-12 DIAGNOSIS — Z780 Asymptomatic menopausal state: Secondary | ICD-10-CM | POA: Diagnosis not present

## 2015-12-12 DIAGNOSIS — M8588 Other specified disorders of bone density and structure, other site: Secondary | ICD-10-CM | POA: Diagnosis not present

## 2015-12-12 DIAGNOSIS — E2839 Other primary ovarian failure: Secondary | ICD-10-CM | POA: Insufficient documentation

## 2015-12-12 DIAGNOSIS — M85852 Other specified disorders of bone density and structure, left thigh: Secondary | ICD-10-CM | POA: Insufficient documentation

## 2015-12-12 LAB — PATHOLOGY

## 2016-02-11 ENCOUNTER — Encounter: Payer: Self-pay | Admitting: Cardiovascular Disease

## 2016-02-11 ENCOUNTER — Ambulatory Visit (INDEPENDENT_AMBULATORY_CARE_PROVIDER_SITE_OTHER): Payer: Medicare Other | Admitting: Cardiovascular Disease

## 2016-02-11 VITALS — BP 105/66 | HR 76 | Ht 61.0 in | Wt 135.2 lb

## 2016-02-11 DIAGNOSIS — I471 Supraventricular tachycardia: Secondary | ICD-10-CM | POA: Diagnosis not present

## 2016-02-11 DIAGNOSIS — E7800 Pure hypercholesterolemia, unspecified: Secondary | ICD-10-CM

## 2016-02-11 DIAGNOSIS — Z950 Presence of cardiac pacemaker: Secondary | ICD-10-CM | POA: Diagnosis not present

## 2016-02-11 DIAGNOSIS — I10 Essential (primary) hypertension: Secondary | ICD-10-CM

## 2016-02-11 DIAGNOSIS — I480 Paroxysmal atrial fibrillation: Secondary | ICD-10-CM

## 2016-02-11 DIAGNOSIS — I447 Left bundle-branch block, unspecified: Secondary | ICD-10-CM

## 2016-02-11 DIAGNOSIS — I442 Atrioventricular block, complete: Secondary | ICD-10-CM

## 2016-02-11 MED ORDER — METOPROLOL SUCCINATE ER 50 MG PO TB24: 50.0000 mg | ORAL_TABLET | Freq: Every day | ORAL | 11 refills | Status: DC

## 2016-02-11 NOTE — Patient Instructions (Signed)
Dr Sallyanne Kuster recommends that you continue on your current medications as directed. Please refer to the Current Medication list given to you today.  Remote monitoring is used to monitor your Pacemaker of ICD from home. This monitoring reduces the number of office visits required to check your device to one time per year. It allows [place] to keep an eye on the functioning of your device to ensure it is working properly. You are scheduled for a device check from home on Wednesday, November 22nd, 2017. You may send your transmission at any time that day. If you have a wireless device, the transmission will be sent automatically. After your physician reviews your transmission, you will receive a postcard with your next transmission date.  Dr Sallyanne Kuster recommends that you schedule a follow-up appointment in 12 months with a device check. You will receive a reminder letter in the mail two months in advance. If you don't receive a letter, please call our office to schedule the follow-up appointment.  If you need a refill on your cardiac medications before your next appointment, please call your pharmacy.

## 2016-02-11 NOTE — Progress Notes (Signed)
Cardiology Office Note    Date:  02/13/2016   ID:  [name], DOB [birth year], MRN [id number]  PCP:  Purvis Kilts, MD  Cardiologist:  Quay Burow, MD: Sanda Klein, MD   Chief Complaint  Patient presents with  . Follow-up    History of Present Illness:  [name] is a 65 y.o. female who presents for  Pacemaker check.   Her device was implanted for symptomatic bradycardia and she has a history of left bundle branch block. She subsequently has developed complete heart block. Her Medtronic Adapta device was implanted in 2011 and has roughly 7 years of estimated generator longevity remaining. There is 13% atrial pacing and 100% ventricular pacing.  Lead parameters are good. Her device recorded one episode of atrial fibrillation that occurred on 06/18/2014 and lasted for 1 hour. Since then, she has had regular paroxysmal atrial tachycardia, up to 6 minutes duration, but no other episode of atrial fibrillation. The episodes occur in the midafternoon. She wonders if they happen when she is line dancing. She just came back from the beach. She has no cardiac complaints whatsoever today. She had a normal nuclear perfusion stress test in May 2017  Past Medical History:  Diagnosis Date  . Arthritis   . Atrial tachycardia ([place])   . Atypical chest pain   . Bundle branch block left    Dr. Rollene Fare is cardiologist  . Elevated cholesterol   . Hypertension   . Osteopenia 09/2013   T score -1.9   . Pacemaker   . Paroxysmal atrial fibrillation (HCC)    rrare episodes on pacemaker interrogation  . Wears glasses     Past Surgical History:  Procedure Laterality Date  . CARDIAC CATHETERIZATION  2005   Ejection fraction is estimated at  greater than or equal to 60 %  Wall motion is normal.  . CAROTID DUPLEX BILATERAL  march 08,2005   Impression : No evidence of of signficant plaque formation or stenosis.  . CARPAL TUNNEL RELEASE  04/30/2011   Procedure: CARPAL TUNNEL RELEASE;   Surgeon: Cammie Sickle., MD;  Location: [facility];  Service: Orthopedics;  Laterality: Left;  left carpal tunnel release, inject left thumb  . CARPAL TUNNEL RELEASE  04/28/2012   Procedure: CARPAL TUNNEL RELEASE;  Surgeon: Cammie Sickle., MD;  Location: [facility];  Service: Orthopedics;  Laterality: Right;  . COLONOSCOPY    . COLONOSCOPY  04/14/2012   Procedure: COLONOSCOPY;  Surgeon: Daneil Dolin, MD;  [facility];  Service: Endoscopy;  Laterality: N/A;  8:30 AM  . DORSAL COMPARTMENT RELEASE  04/30/2011   Procedure: RELEASE DORSAL COMPARTMENT (DEQUERVAIN);  Surgeon: Cammie Sickle., MD;  Location: [facility];  Service: Orthopedics;  Laterality: Left;  release 1st dorsal compartment on left  . INSERT / REPLACE / REMOVE PACEMAKER  07/29/2009   PPM-Dual Chamber Systems Fluoro Guidance Venogram performed 10 ml 's to the left subclavian vein  . PACEMAKER INSERTION  07/2009   Dr. Rollene Fare  Pacemaker is a Adapta L implanted 07/2009  . RENAL CIRCULATION  07/ 08/ 08   normal renal duplex doppler   . TRANSTHORACIC ECHOCARDIOGRAM  10/18/2012   Atrial tracking & V paced rhythm with pacer induced LBBB. No significant intra -ventricular  dyssynchrony.  . TUBAL LIGATION  1986    Current Medications: Outpatient Medications Prior to Visit  Medication Sig Dispense Refill  . aspirin 81 MG tablet  Take 81 mg by mouth daily.    . calcium carbonate (OS-CAL) 600 MG TABS Take 600 mg by mouth 2 (two) times daily with a meal.      . ibuprofen (ADVIL,MOTRIN) 200 MG tablet Take 200 mg by mouth every 6 (six) hours as needed. Just occasional    . lisinopril-hydrochlorothiazide (PRINZIDE,ZESTORETIC) 20-12.5 MG per tablet Take 1 tablet by mouth daily.      . Multiple Vitamin (MULTIVITAMIN) capsule Take 1 capsule by mouth daily.      . naproxen sodium (ALEVE) 220 MG tablet Take 220 mg by mouth daily as needed (pain).    Marland Kitchen OVER THE COUNTER MEDICATION  Take 1 tablet by mouth 2 (two) times daily. Med Name: [place]    . oxymetazoline (AFRIN) 0.05 % nasal spray Place 2 sprays into the nose at bedtime.    . simvastatin (ZOCOR) 40 MG tablet TAKE ONE TABLET BY MOUTH ONCE DAILY AT BEDTIME 30 tablet 9  . metoprolol succinate (TOPROL-XL) 50 MG 24 hr tablet TAKE ONE TABLET BY MOUTH ONCE DAILY 30 tablet 10   No facility-administered medications prior to visit.      Allergies:   Vicodin [hydrocodone-acetaminophen]   Social History   Social History  . Marital status: Married    Spouse name: N/A  . Number of children: N/A  . Years of education: N/A   Social History Main Topics  . Smoking status: Former Smoker    Packs/day: 1.50    Years: 30.00    Types: Cigarettes  . Smokeless tobacco: Never Used  . Alcohol use 4.2 oz/week    7 Glasses of wine per week  . Drug use: No  . Sexual activity: No     Comment: Tubal lig-1st intercourse 65 yo-Fewer than 5 partners   Other Topics Concern  . None   Social History Narrative  . None     Family History:  The patient's family history includes Colon cancer in her maternal aunt; Diabetes in her father; Heart disease in her mother and paternal grandfather.   ROS:   Please see the history of present illness.    ROS All other systems reviewed and are negative.   PHYSICAL EXAM:   VS:  BP 105/66 (BP Location: Left Arm, Patient Position: Sitting, Cuff Size: Normal)   Pulse 76   Ht 5\' 1"  (1.549 m)   Wt 135 lb 3.2 oz (61.3 kg)   SpO2 98%   BMI 25.55 kg/m    GEN: Well nourished, well developed, in no acute distress  HEENT: normal  Neck: no JVD, carotid bruits, or masses Cardiac: paradoxically split S2 RRR; no murmurs, rubs, or gallops,no edema , healthy pacemaker site Respiratory:  clear to auscultation bilaterally, normal work of breathing GI: soft, nontender, nondistended, + BS MS: no deformity or atrophy  Skin: warm and dry, no rash Neuro:  Alert and Oriented x 3,  Strength and sensation are intact Psych: euthymic mood, full affect  Wt Readings from Last 3 Encounters:  02/11/16 135 lb 3.2 oz (61.3 kg)  12/11/15 132 lb (59.9 kg)  10/28/15 133 lb (60.3 kg)      Studies/Labs Reviewed:   EKG:  EKG is ordered today.  The ekg ordered today demonstrates Atrial sensed (sinus), ventricular paced rhythm  Recent Labs: 03/28/2015: ALT 17; BUN 17; Creat 0.82; Hemoglobin 14.0; Platelets 210; Potassium 4.2; Sodium 140   Lipid Panel    Component Value Date/Time   CHOL 169 03/28/2015 0859  TRIG 136 03/28/2015 0859   HDL 51 03/28/2015 0859   CHOLHDL 3.3 03/28/2015 0859   VLDL 27 03/28/2015 0859   LDLCALC 91 03/28/2015 0859     ASSESSMENT:    1. CHB (complete heart block) (HCC)   2. History of permanent cardiac pacemaker placement   3. Paroxysmal atrial fibrillation (HCC)   4. PAT (paroxysmal atrial tachycardia) ([place])   5. Essential hypertension   6. Elevated cholesterol      PLAN:  In order of problems listed above:  1. CHB: Pacemaker dependent 2. PPM: Normal function of her Medtronic Adapta dual-chamber device. Continue downloads via remote transmitter 3 months and office visit yearly 3. PAT: Episodes are brief and mostly asymptomatic. They may be indeed associated with increased physical activity. Specific therapy does not appear to be necessary. 4. AFib: She has only had one brief and asymptomatic episode of atrial fibrillation that was recorded by her pacemaker in December 2015. None recorded since. No history of embolic events. Technically, embolic risk is elevated due to CHADVasc score of 3 (age, gender, HTN). However, unless further events are recorded in the exact benefit of full anticoagulation remains in question. Will defer that decision to her primary cardiologist, Dr. Gwenlyn Found. 5. HTN: Well controlled 6. HLP: Risk factor lipid profile on statin therapy    Medication Adjustments/Labs and Tests Ordered: Current medicines are reviewed  at length with the patient today.  Concerns regarding medicines are outlined above.  Medication changes, Labs and Tests ordered today are listed in the Patient Instructions below. Patient Instructions  Dr Sallyanne Kuster recommends that you continue on your current medications as directed. Please refer to the Current Medication list given to you today.  Remote monitoring is used to monitor your Pacemaker of ICD from home. This monitoring reduces the number of office visits required to check your device to one time per year. It allows [place] to keep an eye on the functioning of your device to ensure it is working properly. You are scheduled for a device check from home on Wednesday, November 22nd, 2017. You may send your transmission at any time that day. If you have a wireless device, the transmission will be sent automatically. After your physician reviews your transmission, you will receive a postcard with your next transmission date.  Dr Sallyanne Kuster recommends that you schedule a follow-up appointment in 12 months with a device check. You will receive a reminder letter in the mail two months in advance. If you don't receive a letter, please call our office to schedule the follow-up appointment.  If you need a refill on your cardiac medications before your next appointment, please call your pharmacy.    Signed, Sanda Klein, MD  02/13/2016 2:28 PM    [address], [place], [place]  [address] Phone: [phone]; Fax: [phone]

## 2016-02-13 DIAGNOSIS — I471 Supraventricular tachycardia: Secondary | ICD-10-CM | POA: Insufficient documentation

## 2016-02-17 ENCOUNTER — Other Ambulatory Visit (HOSPITAL_COMMUNITY): Payer: Self-pay | Admitting: Family Medicine

## 2016-02-17 DIAGNOSIS — Z1231 Encounter for screening mammogram for malignant neoplasm of breast: Secondary | ICD-10-CM

## 2016-02-17 LAB — CUP PACEART INCLINIC DEVICE CHECK
Battery Impedance: 522 Ohm
Battery Voltage: 2.8 V
Brady Statistic AP VS Percent: 0 %
Brady Statistic AS VP Percent: 87 %
Date Time Interrogation Session: 20170823125337
Implantable Lead Implant Date: 20110208
Implantable Lead Location: 753860
Implantable Lead Model: 5076
Lead Channel Impedance Value: 509 Ohm
Lead Channel Sensing Intrinsic Amplitude: 2.8 mV
Lead Channel Setting Pacing Amplitude: 1.5 V
Lead Channel Setting Pacing Pulse Width: 0.4 ms
Lead Channel Setting Sensing Sensitivity: 2.8 mV
MDC IDC LEAD IMPLANT DT: 20110208
MDC IDC LEAD LOCATION: 753859
MDC IDC MSMT BATTERY REMAINING LONGEVITY: 87 mo
MDC IDC MSMT LEADCHNL RA IMPEDANCE VALUE: 461 Ohm
MDC IDC MSMT LEADCHNL RA PACING THRESHOLD AMPLITUDE: 0.375 V
MDC IDC MSMT LEADCHNL RA PACING THRESHOLD PULSEWIDTH: 0.4 ms
MDC IDC MSMT LEADCHNL RV PACING THRESHOLD AMPLITUDE: 0.625 V
MDC IDC MSMT LEADCHNL RV PACING THRESHOLD PULSEWIDTH: 0.4 ms
MDC IDC SET LEADCHNL RV PACING AMPLITUDE: 2.25 V
MDC IDC STAT BRADY AP VP PERCENT: 13 %
MDC IDC STAT BRADY AS VS PERCENT: 0 %

## 2016-02-20 ENCOUNTER — Encounter: Payer: Self-pay | Admitting: Cardiovascular Disease

## 2016-03-13 ENCOUNTER — Emergency Department (HOSPITAL_COMMUNITY)
Admission: EM | Admit: 2016-03-13 | Discharge: 2016-03-13 | Disposition: A | Discharge status: Home / Self Care | Payer: Medicare Other | Attending: Emergency Medicine | Admitting: Emergency Medicine

## 2016-03-13 ENCOUNTER — Emergency Department (HOSPITAL_COMMUNITY): Payer: Medicare Other

## 2016-03-13 ENCOUNTER — Encounter (HOSPITAL_COMMUNITY): Payer: Self-pay | Admitting: Emergency Medicine

## 2016-03-13 DIAGNOSIS — R0600 Dyspnea, unspecified: Secondary | ICD-10-CM

## 2016-03-13 DIAGNOSIS — Z791 Long term (current) use of non-steroidal anti-inflammatories (NSAID): Secondary | ICD-10-CM | POA: Insufficient documentation

## 2016-03-13 DIAGNOSIS — Z87891 Personal history of nicotine dependence: Secondary | ICD-10-CM | POA: Diagnosis not present

## 2016-03-13 DIAGNOSIS — I10 Essential (primary) hypertension: Secondary | ICD-10-CM | POA: Insufficient documentation

## 2016-03-13 DIAGNOSIS — R079 Chest pain, unspecified: Secondary | ICD-10-CM | POA: Diagnosis not present

## 2016-03-13 DIAGNOSIS — Z7982 Long term (current) use of aspirin: Secondary | ICD-10-CM | POA: Diagnosis not present

## 2016-03-13 DIAGNOSIS — Z79899 Other long term (current) drug therapy: Secondary | ICD-10-CM | POA: Insufficient documentation

## 2016-03-13 DIAGNOSIS — R0602 Shortness of breath: Secondary | ICD-10-CM | POA: Insufficient documentation

## 2016-03-13 LAB — BASIC METABOLIC PANEL
Anion gap: 9 (ref 5–15)
BUN: 12 mg/dL (ref 6–20)
CALCIUM: 10.5 mg/dL — AB (ref 8.9–10.3)
CO2: 29 mmol/L (ref 22–32)
CREATININE: 0.68 mg/dL (ref 0.44–1.00)
Chloride: 103 mmol/L (ref 101–111)
Glucose, Bld: 99 mg/dL (ref 65–99)
Potassium: 3.4 mmol/L — ABNORMAL LOW (ref 3.5–5.1)
Sodium: 141 mmol/L (ref 135–145)

## 2016-03-13 LAB — HEPATIC FUNCTION PANEL
ALT: 33 U/L (ref 14–54)
AST: 29 U/L (ref 15–41)
Albumin: 4.7 g/dL (ref 3.5–5.0)
Alkaline Phosphatase: 55 U/L (ref 38–126)
BILIRUBIN DIRECT: 0.1 mg/dL (ref 0.1–0.5)
BILIRUBIN INDIRECT: 0.8 mg/dL (ref 0.3–0.9)
TOTAL PROTEIN: 7.5 g/dL (ref 6.5–8.1)
Total Bilirubin: 0.9 mg/dL (ref 0.3–1.2)

## 2016-03-13 LAB — CBC
HCT: 39.2 % (ref 36.0–46.0)
Hemoglobin: 13.5 g/dL (ref 12.0–15.0)
MCH: 33.5 pg (ref 26.0–34.0)
MCHC: 34.4 g/dL (ref 30.0–36.0)
MCV: 97.3 fL (ref 78.0–100.0)
PLATELETS: 205 10*3/uL (ref 150–400)
RBC: 4.03 MIL/uL (ref 3.87–5.11)
RDW: 11.5 % (ref 11.5–15.5)
WBC: 4.8 10*3/uL (ref 4.0–10.5)

## 2016-03-13 LAB — D-DIMER, QUANTITATIVE: D-Dimer, Quant: 0.64 ug/mL-FEU — ABNORMAL HIGH (ref 0.00–0.50)

## 2016-03-13 LAB — TROPONIN I

## 2016-03-13 MED ORDER — IOPAMIDOL (ISOVUE-370) INJECTION 76%
100.0000 mL | Freq: Once | INTRAVENOUS | Status: AC | PRN
  Administered 2016-03-13: 100 mL via INTRAVENOUS

## 2016-03-13 NOTE — ED Notes (Signed)
MD at bedside. 

## 2016-03-13 NOTE — Discharge Instructions (Signed)
Follow-up with your cardiologist next week. If you can't get into your cardiologist and see her family doctor. Tell them you're in the emergency department and the physician there felt like he needed to be seen this week also return if getting worse prior to your doctor's visit

## 2016-03-13 NOTE — ED Notes (Signed)
Pt transported to CT ?

## 2016-03-13 NOTE — ED Triage Notes (Signed)
Pt reports centralized chest pain with radiation to her back and arm. SOB, weakness and dizziness. Pt has dual pacemaker. BBB.

## 2016-03-13 NOTE — ED Provider Notes (Signed)
[facility] DEPT Provider Note   CSN: [id number] Arrival date & time: 03/13/16  1515     History   Chief Complaint Chief Complaint  Patient presents with  . Chest Pain    HPI [name] is a 65 y.o. female.  Patient states that for the last week or so she's been getting short of breath whenever she exerts herself. Also some back pain.   The history is provided by the patient. No language interpreter was used.  Shortness of Breath  This is a new problem. The problem occurs frequently.The current episode started less than 1 hour ago. The problem has been resolved. Pertinent negatives include no fever, no headaches, no cough, no chest pain, no abdominal pain and no rash. The problem's precipitants include medical treatment. Risk factors: Cardiac disease. She has tried nothing for the symptoms. She has had prior hospitalizations. She has had prior ED visits. She has had no prior ICU admissions.    Past Medical History:  Diagnosis Date  . Arthritis   . Atrial tachycardia ([place])   . Atypical chest pain   . Bundle branch block left    Dr. Rollene Fare is cardiologist  . Elevated cholesterol   . Hypertension   . Osteopenia 09/2013   T score -1.9   . Pacemaker   . Paroxysmal atrial fibrillation (HCC)    rrare episodes on pacemaker interrogation  . Wears glasses     Patient Active Problem List   Diagnosis Date Noted  . PAT (paroxysmal atrial tachycardia) ([place]) 02/13/2016  . Symptomatic bradycardia 01/30/2015  . CHB (complete heart block) ([place]) 01/30/2015  . Chest pain 08/20/2013  . History of permanent cardiac pacemaker placement 08/20/2013  . Menopause 09/10/2011  . Vaginal atrophy 09/10/2011  . Elevated cholesterol   . Hypertension   . Osteopenia   . Bundle branch block left     Past Surgical History:  Procedure Laterality Date  . CARDIAC CATHETERIZATION  2005   Ejection fraction is estimated at  greater than or equal to 60 %  Wall motion is normal.  . CAROTID  DUPLEX BILATERAL  march 08,2005   Impression : No evidence of of signficant plaque formation or stenosis.  . CARPAL TUNNEL RELEASE  04/30/2011   Procedure: CARPAL TUNNEL RELEASE;  Surgeon: Cammie Sickle., MD;  Location: [facility];  Service: Orthopedics;  Laterality: Left;  left carpal tunnel release, inject left thumb  . CARPAL TUNNEL RELEASE  04/28/2012   Procedure: CARPAL TUNNEL RELEASE;  Surgeon: Cammie Sickle., MD;  Location: [facility];  Service: Orthopedics;  Laterality: Right;  . COLONOSCOPY    . COLONOSCOPY  04/14/2012   Procedure: COLONOSCOPY;  Surgeon: Daneil Dolin, MD;  [facility];  Service: Endoscopy;  Laterality: N/A;  8:30 AM  . DORSAL COMPARTMENT RELEASE  04/30/2011   Procedure: RELEASE DORSAL COMPARTMENT (DEQUERVAIN);  Surgeon: Cammie Sickle., MD;  Location: [facility];  Service: Orthopedics;  Laterality: Left;  release 1st dorsal compartment on left  . INSERT / REPLACE / REMOVE PACEMAKER  07/29/2009   PPM-Dual Chamber Systems Fluoro Guidance Venogram performed 10 ml 's to the left subclavian vein  . PACEMAKER INSERTION  07/2009   Dr. Rollene Fare  Pacemaker is a Adapta L implanted 07/2009  . RENAL CIRCULATION  07/ 08/ 08   normal renal duplex doppler   . TRANSTHORACIC ECHOCARDIOGRAM  10/18/2012   Atrial tracking & V paced rhythm with pacer  induced LBBB. No significant intra -ventricular  dyssynchrony.  . TUBAL LIGATION  1986    OB History    Gravida Para Term Preterm AB Living   2 2 2    0 2   SAB TAB Ectopic Multiple Live Births                   Home Medications    Prior to Admission medications   Medication Sig Start Date End Date Taking? Authorizing Provider  aspirin 81 MG tablet Take 81 mg by mouth daily.   Yes Historical Provider, MD  butalbital-acetaminophen-caffeine (FIORICET, ESGIC) 50-325-40 MG tablet Take 1 tablet by mouth 3 times/day as needed-between meals & bedtime for headache.    Yes Historical Provider, MD  calcium carbonate (OS-CAL) 600 MG TABS Take 600 mg by mouth 2 (two) times daily with a meal.     Yes Historical Provider, MD  ibuprofen (ADVIL,MOTRIN) 200 MG tablet Take 200 mg by mouth every 6 (six) hours as needed. Just occasional   Yes Historical Provider, MD  lisinopril-hydrochlorothiazide (PRINZIDE,ZESTORETIC) 20-12.5 MG per tablet Take 1 tablet by mouth daily.     Yes Historical Provider, MD  metoprolol succinate (TOPROL-XL) 50 MG 24 hr tablet Take 1 tablet (50 mg total) by mouth daily. Take with or immediately following a meal. 02/11/16  Yes Mihai Croitoru, MD  Multiple Vitamin (MULTIVITAMIN) capsule Take 1 capsule by mouth daily.     Yes Historical Provider, MD  naproxen sodium (ALEVE) 220 MG tablet Take 220 mg by mouth daily as needed (pain).   Yes Historical Provider, MD  OVER THE COUNTER MEDICATION Take 1 tablet by mouth 2 (two) times daily. Med Name: [place]   Yes Historical Provider, MD  oxymetazoline (AFRIN) 0.05 % nasal spray Place 2 sprays into the nose at bedtime.   Yes Historical Provider, MD  simvastatin (ZOCOR) 40 MG tablet TAKE ONE TABLET BY MOUTH ONCE DAILY AT BEDTIME 06/20/15  Yes Sanda Klein, MD    Family History Family History  Problem Relation Age of Onset  . Heart disease Mother   . Diabetes Father   . Heart disease Paternal Grandfather   . Colon cancer Maternal Aunt     Social History Social History  Substance Use Topics  . Smoking status: Former Smoker    Packs/day: 1.50    Years: 30.00    Types: Cigarettes  . Smokeless tobacco: Never Used  . Alcohol use 4.2 oz/week    7 Glasses of wine per week     Allergies   Vicodin [hydrocodone-acetaminophen]   Review of Systems Review of Systems  Constitutional: Negative for appetite change, fatigue and fever.  HENT: Negative for congestion, ear discharge and sinus pressure.   Eyes: Negative for discharge.  Respiratory: Positive for shortness of breath.  Negative for cough.   Cardiovascular: Negative for chest pain.  Gastrointestinal: Negative for abdominal pain and diarrhea.  Genitourinary: Negative for frequency and hematuria.  Musculoskeletal: Negative for back pain.  Skin: Negative for rash.  Neurological: Negative for seizures and headaches.  Psychiatric/Behavioral: Negative for hallucinations.     Physical Exam Updated Vital Signs BP 133/64   Pulse 94   Temp 98 F (36.7 C) (Oral)   Resp 19   Ht 5\' 1"  (1.549 m)   Wt 131 lb (59.4 kg)   SpO2 96%   BMI 24.75 kg/m   Physical Exam  Constitutional: She is oriented to person, place, and time. She appears well-developed.  HENT:  Head: Normocephalic.  Eyes: Conjunctivae and EOM are normal. No scleral icterus.  Neck: Neck supple. No thyromegaly present.  Cardiovascular: Normal rate and regular rhythm.  Exam reveals no gallop and no friction rub.   No murmur heard. Pulmonary/Chest: No stridor. She has no wheezes. She has no rales. She exhibits no tenderness.  Abdominal: She exhibits no distension. There is no tenderness. There is no rebound.  Musculoskeletal: Normal range of motion. She exhibits no edema.  Lymphadenopathy:    She has no cervical adenopathy.  Neurological: She is oriented to person, place, and time. She exhibits normal muscle tone. Coordination normal.  Skin: No rash noted. No erythema.  Psychiatric: She has a normal mood and affect. Her behavior is normal.     ED Treatments / Results  Labs (all labs ordered are listed, but only abnormal results are displayed) Labs Reviewed  BASIC METABOLIC PANEL - Abnormal; Notable for the following:       Result Value   Potassium 3.4 (*)    Calcium 10.5 (*)    All other components within normal limits  D-DIMER, QUANTITATIVE (NOT AT [facility]) - Abnormal; Notable for the following:    D-Dimer, Quant 0.64 (*)    All other components within normal limits  CBC  TROPONIN I  HEPATIC FUNCTION PANEL  TROPONIN I    EKG  EKG  Interpretation  Date/Time:  Saturday March 13 2016 15:25:10 EDT Ventricular Rate:  88 PR Interval:  144 QRS Duration: 146 QT Interval:  430 QTC Calculation: 520 R Axis:   -73 Text Interpretation:  Atrial-sensed ventricular-paced rhythm Abnormal ECG Confirmed by Gudrun Axe  MD, Dak Szumski ([id number]) on 03/13/2016 9:56:02 PM       Radiology Dg Chest 2 View  Result Date: 03/13/2016 CLINICAL DATA:  Chest pain. EXAM: CHEST  2 VIEW COMPARISON:  July 30, 2009 FINDINGS: There is no edema or consolidation. The heart size and pulmonary vascularity are normal. Pacemaker leads are attached to the right atrium and right ventricle. No bone lesions are evident. IMPRESSION: No edema or consolidation. Electronically Signed   By: Lowella Grip III M.D.   On: 03/13/2016 16:04   Ct Angio Chest Pe W And/or Wo Contrast  Result Date: 03/13/2016 CLINICAL DATA:  Shortness of breath EXAM: CT ANGIOGRAPHY CHEST WITH CONTRAST TECHNIQUE: Multidetector CT imaging of the chest was performed using the standard protocol during bolus administration of intravenous contrast. Multiplanar CT image reconstructions and MIPs were obtained to evaluate the vascular anatomy. CONTRAST:  100 mL Isovue 370 IV COMPARISON:  Chest radiographs dated 03/13/2016 FINDINGS: Cardiovascular: Satisfactory opacification of the pulmonary arteries to the segmental level. No evidence of pulmonary embolism. No evidence of thoracic aortic aneurysm or dissection. Heart is normal in size.  No pericardial effusion. Three vessel coronary atherosclerosis. Right subclavian pacemaker. Mediastinum/Nodes: No suspicious mediastinal, hilar, or axillary lymphadenopathy. Visualized thyroid is unremarkable. Lungs/Pleura: 3 mm calcified granuloma in the posterior right upper lobe (series 7/image 37), benign. No suspicious pulmonary nodules. Lungs are otherwise clear. No focal consolidation. No pleural effusion or pneumothorax. Upper Abdomen: Visualized upper abdomen is  unremarkable. Musculoskeletal: Mild degenerative changes of the visualized thoracolumbar spine. Review of the MIP images confirms the above findings. IMPRESSION: No evidence of pulmonary embolism. No evidence of acute cardiopulmonary disease. Electronically Signed   By: Julian Hy M.D.   On: 03/13/2016 19:53    Procedures Procedures (including critical care time)  Medications Ordered in ED Medications  iopamidol (ISOVUE-370) 76 % injection 100 mL (100  mLs Intravenous Contrast Given 03/13/16 1911)     Initial Impression / Assessment and Plan / ED Course  I have reviewed the triage vital signs and the nursing notes.  Pertinent labs & imaging results that were available during my care of the patient were reviewed by me and considered in my medical decision making (see chart for details).  Clinical Course   CBC and chemistries unremarkable. Patient had 2 troponins that were negative. CT angio of the chest unremarkable.  EKG shows paced rhythm. Patient's symptoms had improved. I told the patient that we could admit her to the hospital for observation for the symptoms. She stated she'd rather go home and follow-up with her cardiologist. She understood if she gets worse to come back   Final Clinical Impressions(s) / ED Diagnoses   Final diagnoses:  Dyspnea    New Prescriptions New Prescriptions   No medications on file     Milton Ferguson, MD 03/13/16 2223

## 2016-03-14 ENCOUNTER — Encounter: Payer: Self-pay | Admitting: Cardiovascular Disease

## 2016-03-24 ENCOUNTER — Ambulatory Visit (HOSPITAL_COMMUNITY): Payer: Medicare Other

## 2016-03-24 ENCOUNTER — Ambulatory Visit (HOSPITAL_COMMUNITY)
Admission: RE | Admit: 2016-03-24 | Discharge: 2016-03-24 | Disposition: A | Discharge status: Home / Self Care | Payer: Medicare Other | Source: Ambulatory Visit | Attending: Family Medicine | Admitting: Family Medicine

## 2016-03-24 DIAGNOSIS — Z1231 Encounter for screening mammogram for malignant neoplasm of breast: Secondary | ICD-10-CM

## 2016-03-30 DIAGNOSIS — Z0000 Encounter for general adult medical examination without abnormal findings: Secondary | ICD-10-CM | POA: Diagnosis not present

## 2016-03-30 DIAGNOSIS — R7301 Impaired fasting glucose: Secondary | ICD-10-CM | POA: Diagnosis not present

## 2016-03-30 DIAGNOSIS — E782 Mixed hyperlipidemia: Secondary | ICD-10-CM | POA: Diagnosis not present

## 2016-03-30 DIAGNOSIS — Z6825 Body mass index (BMI) 25.0-25.9, adult: Secondary | ICD-10-CM | POA: Diagnosis not present

## 2016-03-30 DIAGNOSIS — Z1389 Encounter for screening for other disorder: Secondary | ICD-10-CM | POA: Diagnosis not present

## 2016-03-30 DIAGNOSIS — I10 Essential (primary) hypertension: Secondary | ICD-10-CM | POA: Diagnosis not present

## 2016-04-01 DIAGNOSIS — Z6825 Body mass index (BMI) 25.0-25.9, adult: Secondary | ICD-10-CM | POA: Diagnosis not present

## 2016-04-01 DIAGNOSIS — E663 Overweight: Secondary | ICD-10-CM | POA: Diagnosis not present

## 2016-04-01 DIAGNOSIS — E0590 Thyrotoxicosis, unspecified without thyrotoxic crisis or storm: Secondary | ICD-10-CM | POA: Diagnosis not present

## 2016-04-01 DIAGNOSIS — Z0000 Encounter for general adult medical examination without abnormal findings: Secondary | ICD-10-CM | POA: Diagnosis not present

## 2016-04-01 DIAGNOSIS — Z1389 Encounter for screening for other disorder: Secondary | ICD-10-CM | POA: Diagnosis not present

## 2016-04-02 ENCOUNTER — Telehealth: Payer: Self-pay | Admitting: *Deleted

## 2016-04-02 DIAGNOSIS — E0590 Thyrotoxicosis, unspecified without thyrotoxic crisis or storm: Secondary | ICD-10-CM | POA: Diagnosis not present

## 2016-04-02 NOTE — Telephone Encounter (Signed)
Returned call to patient.  She stated she has been mostly fine since her ER visit. She has follow up with her PCP who checked her thyroid which was apparently low.  PCP is treating. She is feeling better and will call us if she feels like she needs an appt.

## 2016-04-02 NOTE — Telephone Encounter (Signed)
Called patient.  She was not in an area to talk at the moment. She will call back to discuss.

## 2016-04-02 NOTE — Telephone Encounter (Signed)
-----   Message -----  From: Neoma Laming  Sent: 03/14/2016  5:11 PM  To: [facility] Clinical Pool  Subject: Non-Urgent Medical Question             Experienced tightening in chest & back Fri & Sat. (Sept. 22 - 23). Also felt I was tiring easier. So went to [facility] in [place], [place] on Sat afternoon. Dr. Roderic Palau there ran several tests ( you can review in MyChart), but test results were normal. After lying in ER room for 3 hours, I was feeling much better. And have felt fine since. Dr. Roderic Palau & I wanted you to be aware this occurred.   I feel the tightening and tiring were stress related but wanted to be checked out to be sure, hence the ER visit.  Let me know if you think I need to make an appt.      Thank you,  Gisela Trussel  [place]  [place], [place]

## 2016-04-02 NOTE — Telephone Encounter (Signed)
Returning your call. °

## 2016-04-06 DIAGNOSIS — Z23 Encounter for immunization: Secondary | ICD-10-CM | POA: Diagnosis not present

## 2016-04-14 ENCOUNTER — Encounter: Payer: Self-pay | Admitting: Endocrinology

## 2016-04-14 ENCOUNTER — Ambulatory Visit (INDEPENDENT_AMBULATORY_CARE_PROVIDER_SITE_OTHER): Payer: Medicare Other | Admitting: Endocrinology

## 2016-04-14 DIAGNOSIS — E0590 Thyrotoxicosis, unspecified without thyrotoxic crisis or storm: Secondary | ICD-10-CM | POA: Diagnosis not present

## 2016-04-14 LAB — T4, FREE: Free T4: 1.55 ng/dL (ref 0.60–1.60)

## 2016-04-14 LAB — TSH: TSH: 0.04 u[IU]/mL — AB (ref 0.35–4.50)

## 2016-04-14 MED ORDER — METHIMAZOLE 10 MG PO TABS: 10.0000 mg | ORAL_TABLET | Freq: Two times a day (BID) | ORAL | 2 refills | Status: DC

## 2016-04-14 NOTE — Patient Instructions (Signed)
blood tests are requested for you today.  We'll let you know about the results. If it is high again, I'll prescribe for you a pill to slow it down If ever you have fever while taking methimazole, stop it and call us, even if the reason is obvious, because of the risk of a rare side-effect. Please come back for a follow-up appointment in 3 weeks.

## 2016-04-14 NOTE — Progress Notes (Signed)
Subjective:    Patient ID: [name], female    DOB: April 12, 1951, 65 y.o.   MRN: [id number]  HPI Pt was noted on recent wellness visit to have suppressed TSH.   She says in retrospect, she has few mos of slight tremor of the hands, and assoc weight loss (7 lbs).  she has no prior thyroid hx.  she has never had XRT to the anterior neck, or thyroid surgery.  she has never had thyroid imaging.  she does not consume kelp or any other prescribed or non-prescribed thyroid medication.  she has never been on amiodarone.   Past Medical History:  Diagnosis Date  . Arthritis   . Atrial tachycardia ([place])   . Atypical chest pain   . Bundle branch block left    Dr. Rollene Fare is cardiologist  . Elevated cholesterol   . Hypertension   . Osteopenia 09/2013   T score -1.9   . Pacemaker   . Paroxysmal atrial fibrillation (HCC)    rrare episodes on pacemaker interrogation  . Wears glasses     Past Surgical History:  Procedure Laterality Date  . CARDIAC CATHETERIZATION  2005   Ejection fraction is estimated at  greater than or equal to 60 %  Wall motion is normal.  . CAROTID DUPLEX BILATERAL  march 08,2005   Impression : No evidence of of signficant plaque formation or stenosis.  . CARPAL TUNNEL RELEASE  04/30/2011   Procedure: CARPAL TUNNEL RELEASE;  Surgeon: Cammie Sickle., MD;  Location: [place];  Service: Orthopedics;  Laterality: Left;  left carpal tunnel release, inject left thumb  . CARPAL TUNNEL RELEASE  04/28/2012   Procedure: CARPAL TUNNEL RELEASE;  Surgeon: Cammie Sickle., MD;  Location: [place];  Service: Orthopedics;  Laterality: Right;  . COLONOSCOPY    . COLONOSCOPY  04/14/2012   Procedure: COLONOSCOPY;  Surgeon: Daneil Dolin, MD;  [facility];  Service: Endoscopy;  Laterality: N/A;  8:30 AM  . DORSAL COMPARTMENT RELEASE  04/30/2011   Procedure: RELEASE DORSAL COMPARTMENT (DEQUERVAIN);  Surgeon: Cammie Sickle., MD;   Location[facility];  Service: Orthopedics;  Laterality: Left;  release 1st dorsal compartment on left  . INSERT / REPLACE / REMOVE PACEMAKER  07/29/2009   PPM-Dual Chamber Systems Fluoro Guidance Venogram performed 10 ml 's to the left subclavian vein  . PACEMAKER INSERTION  07/2009   Dr. Rollene Fare  Pacemaker is a Adapta L implanted 07/2009  . RENAL CIRCULATION  07/ 08/ 08   normal renal duplex doppler   . TRANSTHORACIC ECHOCARDIOGRAM  10/18/2012   Atrial tracking & V paced rhythm with pacer induced LBBB. No significant intra -ventricular  dyssynchrony.  . TUBAL LIGATION  1986    Social History   Social History  . Marital status: Married    Spouse name: N/A  . Number of children: N/A  . Years of education: N/A   Occupational History  . Not on file.   Social History Main Topics  . Smoking status: Former Smoker    Packs/day: 1.50    Years: 30.00    Types: Cigarettes  . Smokeless tobacco: Never Used  . Alcohol use 4.2 oz/week    7 Glasses of wine per week  . Drug use: No  . Sexual activity: No     Comment: Tubal lig-1st intercourse 65 yo-Fewer than 5 partners   Other Topics Concern  . Not on file  Social History Narrative  . No narrative on file    Current Outpatient Prescriptions on File Prior to Visit  Medication Sig Dispense Refill  . aspirin 81 MG tablet Take 81 mg by mouth daily.    . calcium carbonate (OS-CAL) 600 MG TABS Take 600 mg by mouth 2 (two) times daily with a meal.      . lisinopril-hydrochlorothiazide (PRINZIDE,ZESTORETIC) 20-12.5 MG per tablet Take 1 tablet by mouth daily.      . metoprolol succinate (TOPROL-XL) 50 MG 24 hr tablet Take 1 tablet (50 mg total) by mouth daily. Take with or immediately following a meal. 30 tablet 11  . Multiple Vitamin (MULTIVITAMIN) capsule Take 1 capsule by mouth daily.      . naproxen sodium (ALEVE) 220 MG tablet Take 220 mg by mouth daily as needed (pain).    Marland Kitchen OVER THE COUNTER MEDICATION Take 1 tablet  by mouth 2 (two) times daily. Med Name: [place]    . simvastatin (ZOCOR) 40 MG tablet TAKE ONE TABLET BY MOUTH ONCE DAILY AT BEDTIME 30 tablet 9  . butalbital-acetaminophen-caffeine (FIORICET, ESGIC) 50-325-40 MG tablet Take 1 tablet by mouth 3 times/day as needed-between meals & bedtime for headache.    . ibuprofen (ADVIL,MOTRIN) 200 MG tablet Take 200 mg by mouth every 6 (six) hours as needed. Just occasional    . oxymetazoline (AFRIN) 0.05 % nasal spray Place 2 sprays into the nose at bedtime.     No current facility-administered medications on file prior to visit.     Allergies  Allergen Reactions  . Vicodin [Hydrocodone-Acetaminophen] Nausea Only    Family History  Problem Relation Age of Onset  . Heart disease Mother   . Diabetes Father   . Heart disease Paternal Grandfather   . Colon cancer Maternal Aunt   . Thyroid disease Neg Hx    BP (!) 110/58   Pulse 92   Ht 5\' 1"  (1.549 m)   Wt 130 lb (59 kg)   SpO2 95%   BMI 24.56 kg/m   Review of Systems denies fever, headache, hoarseness, diplopia, sob, diarrhea, polyuria, muscle weakness, excessive diaphoresis, numbness, heat intolerance, and easy bruising. She has intermittent palpitations, easy bruising, and anxiety.    Objective:   Physical Exam VS: see vs page GEN: no distress HEAD: head: no deformity eyes: no periorbital swelling, no proptosis.  external nose and ears are normal mouth: no lesion seen NECK: thyroid is slightly and diffusely enlarged CHEST WALL: no deformity LUNGS: clear to auscultation CV: reg rate and rhythm, no murmur ABD: abdomen is soft, nontender.  no hepatosplenomegaly.  not distended.  no hernia.  MUSCULOSKELETAL: muscle bulk and strength are grossly normal.  no obvious joint swelling.  gait is normal and steady EXTEMITIES: no deformity.  no ulcer on the feet.  feet are of normal color and temp.  no edema PULSES: dorsalis pedis intact bilat.  no carotid bruit NEURO:  cn  2-12 grossly intact.   readily moves all 4's.  sensation is intact to touch on the feet.  Slight tremor of the hands.   SKIN:  Normal texture and temperature.  No rash or suspicious lesion is visible.   NODES:  None palpable at the neck PSYCH: alert, well-oriented.  Does not appear anxious nor depressed.   CT: visualized thyroid is normal  I have reviewed outside records, and summarized: Pt was noted to have abnormal TFT, and referred here.  Main problem addressed was anxiety  Lab Results  Component Value Date   TSH 0.04 (L) 04/14/2016      Assessment & Plan:  Hyperthyroidism, new to me, prob due to Grave's Dz: we discussed rx options: She declines RAI Patient is advised the following: Patient Instructions  blood tests are requested for you today.  We'll let you know about the results. If it is high again, I'll prescribe for you a pill to slow it down If ever you have fever while taking methimazole, stop it and call us, even if the reason is obvious, because of the risk of a rare side-effect. Please come back for a follow-up appointment in 3 weeks.

## 2016-05-02 ENCOUNTER — Other Ambulatory Visit: Payer: Self-pay | Admitting: Cardiovascular Disease

## 2016-05-04 NOTE — Telephone Encounter (Signed)
Rx(s) sent to pharmacy electronically.  

## 2016-05-05 ENCOUNTER — Ambulatory Visit (INDEPENDENT_AMBULATORY_CARE_PROVIDER_SITE_OTHER): Payer: Medicare Other | Admitting: Endocrinology

## 2016-05-05 ENCOUNTER — Encounter: Payer: Self-pay | Admitting: Endocrinology

## 2016-05-05 VITALS — BP 132/84 | HR 76 | Ht 61.0 in | Wt 130.0 lb

## 2016-05-05 DIAGNOSIS — E0590 Thyrotoxicosis, unspecified without thyrotoxic crisis or storm: Secondary | ICD-10-CM

## 2016-05-05 LAB — TSH: TSH: 0.01 u[IU]/mL — ABNORMAL LOW (ref 0.35–4.50)

## 2016-05-05 LAB — T4, FREE: Free T4: 1.04 ng/dL (ref 0.60–1.60)

## 2016-05-05 NOTE — Progress Notes (Signed)
Subjective:    Patient ID: [name], female    DOB: [birth year], 65 y.o.   MRN: [id number]  HPI Pt returns for f/u of hyperthyroidism (dx'ed 2017; she has never had thyroid imaging, but Grave's Dz is suggested by physical exam).  Since on tapazole, she feels well in general.   Past Medical History:  Diagnosis Date  . Arthritis   . Atrial tachycardia ([place])   . Atypical chest pain   . Bundle branch block left    Dr. Rollene Fare is cardiologist  . Elevated cholesterol   . Hypertension   . Osteopenia 09/2013   T score -1.9   . Pacemaker   . Paroxysmal atrial fibrillation (HCC)    rrare episodes on pacemaker interrogation  . Wears glasses     Past Surgical History:  Procedure Laterality Date  . CARDIAC CATHETERIZATION  2005   Ejection fraction is estimated at  greater than or equal to 60 %  Wall motion is normal.  . CAROTID DUPLEX BILATERAL  march 08,2005   Impression : No evidence of of signficant plaque formation or stenosis.  . CARPAL TUNNEL RELEASE  04/30/2011   Procedure: CARPAL TUNNEL RELEASE;  Surgeon: Cammie Sickle., MD;  Location: [place];  Service: Orthopedics;  Laterality: Left;  left carpal tunnel release, inject left thumb  . CARPAL TUNNEL RELEASE  04/28/2012   Procedure: CARPAL TUNNEL RELEASE;  Surgeon: Cammie Sickle., MD;  Location: [place];  Service: Orthopedics;  Laterality: Right;  . COLONOSCOPY    . COLONOSCOPY  04/14/2012   Procedure: COLONOSCOPY;  Surgeon: Daneil Dolin, MD;  [facility];  Service: Endoscopy;  Laterality: N/A;  8:30 AM  . DORSAL COMPARTMENT RELEASE  04/30/2011   Procedure: RELEASE DORSAL COMPARTMENT (DEQUERVAIN);  Surgeon: Cammie Sickle., MD;  Location: [facility];  Service: Orthopedics;  Laterality: Left;  release 1st dorsal compartment on left  . INSERT / REPLACE / REMOVE PACEMAKER  07/29/2009   PPM-Dual Chamber Systems Fluoro Guidance Venogram performed 10 ml 's  to the left subclavian vein  . PACEMAKER INSERTION  07/2009   Dr. Rollene Fare  Pacemaker is a Adapta L implanted 07/2009  . RENAL CIRCULATION  07/ 08/ 08   normal renal duplex doppler   . TRANSTHORACIC ECHOCARDIOGRAM  10/18/2012   Atrial tracking & V paced rhythm with pacer induced LBBB. No significant intra -ventricular  dyssynchrony.  . TUBAL LIGATION  1986    Social History   Social History  . Marital status: Married    Spouse name: N/A  . Number of children: N/A  . Years of education: N/A   Occupational History  . Not on file.   Social History Main Topics  . Smoking status: Former Smoker    Packs/day: 1.50    Years: 30.00    Types: Cigarettes  . Smokeless tobacco: Never Used  . Alcohol use 4.2 oz/week    7 Glasses of wine per week  . Drug use: No  . Sexual activity: No     Comment: Tubal lig-1st intercourse 65 yo-Fewer than 5 partners   Other Topics Concern  . Not on file   Social History Narrative  . No narrative on file    Current Outpatient Prescriptions on File Prior to Visit  Medication Sig Dispense Refill  . aspirin 81 MG tablet Take 81 mg by mouth daily.    . butalbital-acetaminophen-caffeine (FIORICET, ESGIC) 50-325-40 MG tablet Take  1 tablet by mouth 3 times/day as needed-between meals & bedtime for headache.    . calcium carbonate (OS-CAL) 600 MG TABS Take 600 mg by mouth 2 (two) times daily with a meal.      . ibuprofen (ADVIL,MOTRIN) 200 MG tablet Take 200 mg by mouth every 6 (six) hours as needed. Just occasional    . lisinopril-hydrochlorothiazide (PRINZIDE,ZESTORETIC) 20-12.5 MG per tablet Take 1 tablet by mouth daily.      . methimazole (TAPAZOLE) 10 MG tablet Take 1 tablet (10 mg total) by mouth 2 (two) times daily. 60 tablet 2  . metoprolol succinate (TOPROL-XL) 50 MG 24 hr tablet Take 1 tablet (50 mg total) by mouth daily. Take with or immediately following a meal. 30 tablet 11  . Multiple Vitamin (MULTIVITAMIN) capsule Take 1 capsule by mouth  daily.      . naproxen sodium (ALEVE) 220 MG tablet Take 220 mg by mouth daily as needed (pain).    Marland Kitchen OVER THE COUNTER MEDICATION Take 1 tablet by mouth 2 (two) times daily. Med Name: [place]    . oxymetazoline (AFRIN) 0.05 % nasal spray Place 2 sprays into the nose at bedtime.    . simvastatin (ZOCOR) 40 MG tablet TAKE ONE TABLET BY MOUTH ONCE DAILY AT BEDTIME 90 tablet 3   No current facility-administered medications on file prior to visit.     Allergies  Allergen Reactions  . Vicodin [Hydrocodone-Acetaminophen] Nausea Only    Family History  Problem Relation Age of Onset  . Heart disease Mother   . Diabetes Father   . Heart disease Paternal Grandfather   . Colon cancer Maternal Aunt   . Thyroid disease Neg Hx     BP 132/84   Pulse 76   Ht 5\' 1"  (1.549 m)   Wt 130 lb (59 kg)   SpO2 96%   BMI 24.56 kg/m    Review of Systems Denies fever.     Objective:   Physical Exam VS: see vs page GEN: no distress.   NECK: thyroid is slightly and diffusely enlarged.    Lab Results  Component Value Date   TSH 0.01 (L) 05/05/2016      Assessment & Plan:  Hyperthyroidism, improved.  Please continue the same medication.  She declines RAI.

## 2016-05-05 NOTE — Patient Instructions (Addendum)
blood tests are requested for you today.  We'll let you know about the results.   If ever you have fever while taking methimazole, stop it and call us, even if the reason is obvious, because of the risk of a rare side-effect.  Please come back for a follow-up appointment in 2 months.   

## 2016-05-12 ENCOUNTER — Ambulatory Visit (INDEPENDENT_AMBULATORY_CARE_PROVIDER_SITE_OTHER): Payer: Medicare Other | Admitting: *Deleted

## 2016-05-12 DIAGNOSIS — I442 Atrioventricular block, complete: Secondary | ICD-10-CM | POA: Diagnosis not present

## 2016-05-12 NOTE — Progress Notes (Signed)
Remote pacemaker transmission.   

## 2016-05-20 ENCOUNTER — Encounter: Payer: Self-pay | Admitting: Cardiology

## 2016-05-29 LAB — CUP PACEART REMOTE DEVICE CHECK
Battery Remaining Longevity: 81 mo
Battery Voltage: 2.8 V
Brady Statistic AP VS Percent: 0 %
Date Time Interrogation Session: 20171122110347
Implantable Lead Implant Date: 20110208
Implantable Lead Location: 753859
Implantable Lead Location: 753860
Implantable Lead Model: 5076
Implantable Pulse Generator Implant Date: 20110208
Lead Channel Pacing Threshold Amplitude: 0.75 V
Lead Channel Setting Pacing Amplitude: 1.5 V
Lead Channel Setting Pacing Amplitude: 2.25 V
Lead Channel Setting Pacing Pulse Width: 0.4 ms
MDC IDC LEAD IMPLANT DT: 20110208
MDC IDC MSMT BATTERY IMPEDANCE: 597 Ohm
MDC IDC MSMT LEADCHNL RA IMPEDANCE VALUE: 436 Ohm
MDC IDC MSMT LEADCHNL RA PACING THRESHOLD AMPLITUDE: 0.5 V
MDC IDC MSMT LEADCHNL RA PACING THRESHOLD PULSEWIDTH: 0.4 ms
MDC IDC MSMT LEADCHNL RA SENSING INTR AMPL: 2.8 mV
MDC IDC MSMT LEADCHNL RV IMPEDANCE VALUE: 495 Ohm
MDC IDC MSMT LEADCHNL RV PACING THRESHOLD PULSEWIDTH: 0.4 ms
MDC IDC SET LEADCHNL RV SENSING SENSITIVITY: 2.8 mV
MDC IDC STAT BRADY AP VP PERCENT: 10 %
MDC IDC STAT BRADY AS VP PERCENT: 90 %
MDC IDC STAT BRADY AS VS PERCENT: 0 %

## 2016-06-03 ENCOUNTER — Telehealth: Payer: Self-pay | Admitting: Cardiovascular Disease

## 2016-06-03 NOTE — Telephone Encounter (Signed)
Left message to call back   Notes Recorded by Therisa Doyne on 06/02/2016 at 4:25 PM EST Left message to call back ------  Notes Recorded by Lorretta Harp, MD on 06/02/2016 at 5:47 AM EST Return office visit with me to discuss results at next available ------  Notes Recorded by Sanda Klein, MD on 06/01/2016 at 7:18 PM EST Remote reviewed.  Complete heart block: pacemaker dependent. Battery status is good. Lead measurements are stable. Heart rate histogram is favorable.  Two-hour episode of atrial fibrillation recorded September 28. 13-hour episode of atrial fibrillation was recorded in late October. 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 Loanne Drilling. It may be appropriate to start anticoagulation, at least until thyrotoxicosis resolves.  Defer to Dr. Gwenlyn Found.

## 2016-06-03 NOTE — Telephone Encounter (Signed)
Left message to call back  

## 2016-06-03 NOTE — Telephone Encounter (Signed)
[name] is returning the call

## 2016-06-04 NOTE — Telephone Encounter (Signed)
Pt returned this office call she asked if she can get a call back 11 am please.  Pt has and appointment this morning.

## 2016-06-04 NOTE — Telephone Encounter (Signed)
Spoke w patient, communicated on results and made appt. She voiced understanding and thanks.

## 2016-06-07 ENCOUNTER — Ambulatory Visit (INDEPENDENT_AMBULATORY_CARE_PROVIDER_SITE_OTHER): Payer: Medicare Other | Admitting: Cardiovascular Disease

## 2016-06-07 ENCOUNTER — Encounter: Payer: Self-pay | Admitting: Cardiovascular Disease

## 2016-06-07 DIAGNOSIS — I10 Essential (primary) hypertension: Secondary | ICD-10-CM

## 2016-06-07 DIAGNOSIS — I480 Paroxysmal atrial fibrillation: Secondary | ICD-10-CM

## 2016-06-07 DIAGNOSIS — E7800 Pure hypercholesterolemia, unspecified: Secondary | ICD-10-CM | POA: Diagnosis not present

## 2016-06-07 DIAGNOSIS — Z950 Presence of cardiac pacemaker: Secondary | ICD-10-CM | POA: Diagnosis not present

## 2016-06-07 NOTE — Assessment & Plan Note (Signed)
History of hyperlipidemia on statin therapy followed by her PCP. 

## 2016-06-07 NOTE — Patient Instructions (Signed)
Medication Instructions: Your physician recommends that you continue on your current medications as directed. Please refer to the Current Medication list given to you today.   Follow-Up: Your physician recommends that you schedule a follow-up appointment with [facility], for anticoagulation therapy for Paroxysmal AFIB.  Your physician wants you to follow-up in: 1 year with Dr. Gwenlyn Found. You will receive a reminder letter in the mail two months in advance. If you don't receive a letter, please call our office to schedule the follow-up appointment.  If you need a refill on your cardiac medications before your next appointment, please call your pharmacy.

## 2016-06-07 NOTE — Assessment & Plan Note (Signed)
History of hypertension with blood pressure measured 151/84. She is on lisinopril and hydrochlorothiazide as well as metoprolol. Continue current meds at current dosing.

## 2016-06-07 NOTE — Assessment & Plan Note (Signed)
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 Sallyanne Kuster.

## 2016-06-07 NOTE — Assessment & Plan Note (Signed)
History of PAF found on pacemaker interrogation however she is symptomatic.The CHA2DSVASC2 score is  3 (hypertension, female gender, age over 95) . I'm going to begin her on a novel oral anticoagulant.

## 2016-06-07 NOTE — Progress Notes (Signed)
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 Hilma Favors in [place]. I am assuming her care from Dr. Rollene Fare who was her previous cardiologist. I last saw her in the office 02/26/14. Her cardiac risk factor profile is positive for 60 pack years of tobacco abuse having quit 10 years ago as was treated hypertension and hyperlipidemia. There is no family history. She has never had a heart attack or stroke. She had a normal cath in 2005. 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 She had negative Myoview stress test 08/30/13 for scapular pain which subsequently subsided. She's had recurrent back, shoulder and chest pain the last several weeks and months. She also has had episodes of atrial tachycardia demonstrated on pacemaker interrogation as well as paroxysmal atrial fibrillation. She also was recently diagnosed with thyrotoxicosis is being treated for this. Her CHA2DsVASc2 is 3.  Current Outpatient Prescriptions  Medication Sig Dispense Refill  . aspirin 81 MG tablet Take 81 mg by mouth daily.    . butalbital-acetaminophen-caffeine (FIORICET, ESGIC) 50-325-40 MG tablet Take 1 tablet by mouth 3 times/day as needed-between meals & bedtime for headache.    . calcium carbonate (OS-CAL) 600 MG TABS Take 600 mg by mouth 2 (two) times daily with a meal.      . ibuprofen (ADVIL,MOTRIN) 200 MG tablet Take 200 mg by mouth every 6 (six) hours as needed. Just occasional    . lisinopril-hydrochlorothiazide (PRINZIDE,ZESTORETIC) 20-12.5 MG per tablet Take 1 tablet by mouth daily.      . methimazole (TAPAZOLE) 10 MG tablet Take 1 tablet (10 mg  total) by mouth 2 (two) times daily. 60 tablet 2  . metoprolol succinate (TOPROL-XL) 50 MG 24 hr tablet Take 1 tablet (50 mg total) by mouth daily. Take with or immediately following a meal. 30 tablet 11  . Multiple Vitamin (MULTIVITAMIN) capsule Take 1 capsule by mouth daily.      . naproxen sodium (ALEVE) 220 MG tablet Take 220 mg by mouth daily as needed (pain).    Marland Kitchen OVER THE COUNTER MEDICATION Take 1 tablet by mouth 2 (two) times daily. Med Name: [place]    . oxymetazoline (AFRIN) 0.05 % nasal spray Place 2 sprays into the nose at bedtime.    . simvastatin (ZOCOR) 40 MG tablet TAKE ONE TABLET BY MOUTH ONCE DAILY AT BEDTIME 90 tablet 3   No current facility-administered medications for this visit.     Allergies  Allergen Reactions  . Vicodin [Hydrocodone-Acetaminophen] Nausea Only    Social History   Social History  . Marital status: Married    Spouse name: N/A  . Number of children: N/A  . Years of education: N/A   Occupational History  . Not on file.   Social History Main Topics  . Smoking status: Former Smoker    Packs/day: 1.50    Years: 30.00    Types: Cigarettes  . Smokeless tobacco: Never Used  . Alcohol use 4.2 oz/week    7 Glasses of wine per week  . Drug use: No  . Sexual activity: No     Comment: Tubal lig-1st intercourse  65 yo-Fewer than 5 partners   Other Topics Concern  . Not on file   Social History Narrative  . No narrative on file     Review of Systems: General: negative for chills, fever, night sweats or weight changes.  Cardiovascular: negative for chest pain, dyspnea on exertion, edema, orthopnea, palpitations, paroxysmal nocturnal dyspnea or shortness of breath Dermatological: negative for rash Respiratory: negative for cough or wheezing Urologic: negative for hematuria Abdominal: negative for nausea, vomiting, diarrhea, bright red blood per rectum, melena, or hematemesis Neurologic: negative for visual changes,  syncope, or dizziness All other systems reviewed and are otherwise negative except as noted above.    Blood pressure (!) 151/84, pulse 60, height 5\' 1"  (1.549 m), weight 133 lb (60.3 kg).  General appearance: alert and no distress Neck: no adenopathy, no carotid bruit, no JVD, supple, symmetrical, trachea midline and thyroid not enlarged, symmetric, no tenderness/mass/nodules Lungs: clear to auscultation bilaterally Heart: regular rate and rhythm, S1, S2 normal, no murmur, click, rub or gallop Extremities: extremities normal, atraumatic, no cyanosis or edema  EKG not performed today  ASSESSMENT AND PLAN:   Hypertension History of hypertension with blood pressure measured 151/84. She is on lisinopril and hydrochlorothiazide as well as metoprolol. Continue current meds at current dosing.  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 Sallyanne Kuster.  Paroxysmal atrial fibrillation (HCC) History of PAF found on pacemaker interrogation however she is symptomatic.The CHA2DSVASC2 score is  3 (hypertension, female gender, age over 77) . I'm going to begin her on a novel oral anticoagulant.      Lorretta Harp MD FACP,FACC,FAHA, [facility] 06/07/2016 10:11 AM

## 2016-06-10 DIAGNOSIS — M1991 Primary osteoarthritis, unspecified site: Secondary | ICD-10-CM | POA: Diagnosis not present

## 2016-06-30 ENCOUNTER — Other Ambulatory Visit: Payer: Self-pay | Admitting: Cardiovascular Disease

## 2016-06-30 ENCOUNTER — Ambulatory Visit (INDEPENDENT_AMBULATORY_CARE_PROVIDER_SITE_OTHER): Payer: Medicare Other | Admitting: Pharmacist Clinician (PhC)/ Clinical Pharmacy Specialist

## 2016-06-30 DIAGNOSIS — I4891 Unspecified atrial fibrillation: Secondary | ICD-10-CM | POA: Diagnosis not present

## 2016-06-30 LAB — BASIC METABOLIC PANEL
BUN: 23 mg/dL (ref 7–25)
CALCIUM: 9.6 mg/dL (ref 8.6–10.4)
CO2: 31 mmol/L (ref 20–31)
Chloride: 103 mmol/L (ref 98–110)
Creat: 0.93 mg/dL (ref 0.50–0.99)
Glucose, Bld: 89 mg/dL (ref 65–99)
POTASSIUM: 4.1 mmol/L (ref 3.5–5.3)
SODIUM: 141 mmol/L (ref 135–146)

## 2016-06-30 MED ORDER — RIVAROXABAN 20 MG PO TABS: 20.0000 mg | ORAL_TABLET | Freq: Every day | ORAL | 5 refills | Status: DC

## 2016-06-30 MED ORDER — RIVAROXABAN 20 MG PO TABS: 20.0000 mg | ORAL_TABLET | Freq: Every day | ORAL | 0 refills | Status: DC

## 2016-06-30 NOTE — Patient Instructions (Addendum)
If you have any questions or concerns about the Xarelto, please call our office and leave a message for the Pharmacy team.    Rivaroxaban oral tablets What is this medicine? RIVAROXABAN (ri va ROX a ban) is an anticoagulant (blood thinner). It is used to treat blood clots in the lungs or in the veins. It is also used after knee or hip surgeries to prevent blood clots. It is also used to lower the chance of stroke in people with a medical condition called atrial fibrillation. COMMON BRAND NAME(S): Xarelto, Xarelto Starter Pack What should I tell my health care provider before I take this medicine? They need to know if you have any of these conditions: -bleeding disorders -bleeding in the brain -blood in your stools (black or tarry stools) or if you have blood in your vomit -history of stomach bleeding -kidney disease -liver disease -low blood counts, like low white cell, platelet, or red cell counts -recent or planned spinal or epidural procedure -take medicines that treat or prevent blood clots -an unusual or allergic reaction to rivaroxaban, other medicines, foods, dyes, or preservatives -pregnant or trying to get pregnant -breast-feeding How should I use this medicine? Take this medicine by mouth with a glass of water. Follow the directions on the prescription label. Take your medicine at regular intervals. Do not take it more often than directed. Do not stop taking except on your doctor's advice. Stopping this medicine may increase your risk of a blood clot. Be sure to refill your prescription before you run out of medicine. If you are taking this medicine after hip or knee replacement surgery, take it with or without food. If you are taking this medicine for atrial fibrillation, take it with your evening meal. If you are taking this medicine to treat blood clots, take it with food at the same time each day. If you are unable to swallow your tablet, you may crush the tablet and mix it in  applesauce. Then, immediately eat the applesauce. You should eat more food right after you eat the applesauce containing the crushed tablet. Talk to your pediatrician regarding the use of this medicine in children. Special care may be needed. What if I miss a dose? If you take your medicine once a day and miss a dose, take the missed dose as soon as you remember. If you take your medicine twice a day and miss a dose, take the missed dose immediately. In this instance, 2 tablets may be taken at the same time. The next day you should take 1 tablet twice a day as directed. What may interact with this medicine? Do not take this medicine with any of the following medications: -defibrotideThis medicine may also interact with the following medications: -aspirin and aspirin-like medicines -certain antibiotics like erythromycin, azithromycin, and clarithromycin -certain medicines for fungal infections like ketoconazole and itraconazole -certain medicines for irregular heart beat like amiodarone, quinidine, dronedarone -certain medicines for seizures like carbamazepine, phenytoin -certain medicines that treat or prevent blood clots like warfarin, enoxaparin, and dalteparin -conivaptan -diltiazem -felodipine -indinavir -lopinavir; ritonavir -NSAIDS, medicines for pain and inflammation, like ibuprofen or naproxen -ranolazine -rifampin -ritonavir -SNRIs, medicines for depression, like desvenlafaxine, duloxetine, levomilnacipran, venlafaxine -SSRIs, medicines for depression, like citalopram, escitalopram, fluoxetine, fluvoxamine, paroxetine, sertraline -St. John's wort -verapamil What should I watch for while using this medicine? Visit your doctor or health care professional for regular checks on your progress. Your condition will be monitored carefully while you are receiving this medicine. Notify your doctor  or health care professional and seek emergency treatment if you develop breathing problems;  changes in vision; chest pain; severe, sudden headache; pain, swelling, warmth in the leg; trouble speaking; sudden numbness or weakness of the face, arm, or leg. These can be signs that your condition has gotten worse. If you are going to have surgery, tell your doctor or health care professional that you are taking this medicine. Tell your health care professional that you use this medicine before you have a spinal or epidural procedure. Sometimes people who take this medicine have bleeding problems around the spine when they have a spinal or epidural procedure. This bleeding is very rare. If you have a spinal or epidural procedure while on this medicine, call your health care professional immediately if you have back pain, numbness or tingling (especially in your legs and feet), muscle weakness, paralysis, or loss of bladder or bowel control. Avoid sports and activities that might cause injury while you are using this medicine. Severe falls or injuries can cause unseen bleeding. Be careful when using sharp tools or knives. Consider using an Copy. Take special care brushing or flossing your teeth. Report any injuries, bruising, or red spots on the skin to your doctor or health care professional. What side effects may I notice from receiving this medicine? Side effects that you should report to your doctor or health care professional as soon as possible: -allergic reactions like skin rash, itching or hives, swelling of the face, lips, or tongue -back pain -redness, blistering, peeling or loosening of the skin, including inside the mouth -signs and symptoms of bleeding such as bloody or black, tarry stools; red or dark-brown urine; spitting up blood or brown material that looks like coffee grounds; red spots on the skin; unusual bruising or bleeding from the eye, gums, or nose Side effects that usually do not require medical attention (report to your doctor or health care professional if they  continue or are bothersome): -dizziness -muscle pain Where should I keep my medicine? Keep out of the reach of children. Store at room temperature between 15 and 30 degrees C (59 and 86 degrees F). Throw away any unused medicine after the expiration date.  2017 Elsevier/Gold Standard (2015-07-10 11:19:11)

## 2016-06-30 NOTE — Progress Notes (Signed)
A full discussion of the nature of anticoagulants has been carried out (coumadin, Eliquis and Xarelto).  A benefit risk analysis has been presented to the patient, so that they understand the justification for choosing anticoagulation at this time. Side effects of potential bleeding are discussed.  The patient should avoid any OTC items containing aspirin or ibuprofen.    [id number] is 3. (age, female, hypertension).  Of note she states she was at one time on medication for DM, however she has not been in the past few years.    Based on our discussion she will start Xarelto 20 mg once daily with evening meal.  She was given 2 weeks of samples as well as a card for a 30 day supply.  Reviewed brand copay information regarding deductibles and donut hole.    Patient was sent to lab for BMET to ensure correct dose, last BMET was in Sept 2017.

## 2016-07-01 ENCOUNTER — Encounter: Payer: Self-pay | Admitting: Pharmacist Clinician (PhC)/ Clinical Pharmacy Specialist

## 2016-07-05 NOTE — Progress Notes (Signed)
Subjective:    Patient ID: [name], female    DOB: 1950/10/10, 66 y.o.   MRN: [id number]  HPI Pt returns for f/u of hyperthyroidism (dx'ed 2017; she has never had thyroid imaging, but Grave's Dz is suggested by physical exam; she declines RAI).  Since on tapazole, she feels well in general.   Past Medical History:  Diagnosis Date  . Arthritis   . Atrial tachycardia ([place])   . Atypical chest pain   . Bundle branch block left    Dr. Rollene Fare is cardiologist  . Elevated cholesterol   . Hypertension   . Osteopenia 09/2013   T score -1.9   . Pacemaker   . Paroxysmal atrial fibrillation ([facility])    rrare episodes on pacemaker interrogation  . Wears glasses     Past Surgical History:  Procedure Laterality Date  . CARDIAC CATHETERIZATION  2005   Ejection fraction is estimated at  greater than or equal to 60 %  Wall motion is normal.  . CAROTID DUPLEX BILATERAL  march 08,2005   Impression : No evidence of of signficant plaque formation or stenosis.  . CARPAL TUNNEL RELEASE  04/30/2011   Procedure: CARPAL TUNNEL RELEASE;  Surgeon: Cammie Sickle., MD;  Location: [facility];  Service: Orthopedics;  Laterality: Left;  left carpal tunnel release, inject left thumb  . CARPAL TUNNEL RELEASE  04/28/2012   Procedure: CARPAL TUNNEL RELEASE;  Surgeon: Cammie Sickle., MD;  Location: [facility];  Service: Orthopedics;  Laterality: Right;  . COLONOSCOPY    . COLONOSCOPY  04/14/2012   Procedure: COLONOSCOPY;  Surgeon: Daneil Dolin, MD;  [facility];  Service: Endoscopy;  Laterality: N/A;  8:30 AM  . DORSAL COMPARTMENT RELEASE  04/30/2011   Procedure: RELEASE DORSAL COMPARTMENT (DEQUERVAIN);  Surgeon: Cammie Sickle., MD;  [facility];  Service: Orthopedics;  Laterality: Left;  release 1st dorsal compartment on left  . INSERT / REPLACE / REMOVE PACEMAKER  07/29/2009   PPM-Dual Chamber Systems Fluoro Guidance Venogram  performed 10 ml 's to the left subclavian vein  . PACEMAKER INSERTION  07/2009   Dr. Rollene Fare  Pacemaker is a Adapta L implanted 07/2009  . RENAL CIRCULATION  07/ 08/ 08   normal renal duplex doppler   . TRANSTHORACIC ECHOCARDIOGRAM  10/18/2012   Atrial tracking & V paced rhythm with pacer induced LBBB. No significant intra -ventricular  dyssynchrony.  . TUBAL LIGATION  1986    Social History   Social History  . Marital status: Married    Spouse name: N/A  . Number of children: N/A  . Years of education: N/A   Occupational History  . Not on file.   Social History Main Topics  . Smoking status: Former Smoker    Packs/day: 1.50    Years: 30.00    Types: Cigarettes  . Smokeless tobacco: Never Used  . Alcohol use 4.2 oz/week    7 Glasses of wine per week  . Drug use: No  . Sexual activity: No     Comment: Tubal lig-1st intercourse 66 yo-Fewer than 5 partners   Other Topics Concern  . Not on file   Social History Narrative  . No narrative on file    Current Outpatient Prescriptions on File Prior to Visit  Medication Sig Dispense Refill  . aspirin 81 MG tablet Take 81 mg by mouth daily.    . butalbital-acetaminophen-caffeine (FIORICET, [place]) [id number]  MG tablet Take 1 tablet by mouth 3 times/day as needed-between meals & bedtime for headache.    . calcium carbonate (OS-CAL) 600 MG TABS Take 600 mg by mouth 2 (two) times daily with a meal.      . ibuprofen (ADVIL,MOTRIN) 200 MG tablet Take 200 mg by mouth every 6 (six) hours as needed. Just occasional    . lisinopril-hydrochlorothiazide (PRINZIDE,ZESTORETIC) 20-12.5 MG per tablet Take 1 tablet by mouth daily.      . metoprolol succinate (TOPROL-XL) 50 MG 24 hr tablet Take 1 tablet (50 mg total) by mouth daily. Take with or immediately following a meal. 30 tablet 11  . Multiple Vitamin (MULTIVITAMIN) capsule Take 1 capsule by mouth daily.      . naproxen sodium (ALEVE) 220 MG tablet Take 220 mg by mouth daily as needed  (pain).    Marland Kitchen OVER THE COUNTER MEDICATION Take 1 tablet by mouth 2 (two) times daily. Med Name: [place]    . oxymetazoline (AFRIN) 0.05 % nasal spray Place 2 sprays into the nose at bedtime.    . rivaroxaban (XARELTO) 20 MG TABS tablet Take 1 tablet (20 mg total) by mouth daily with supper. 30 tablet 5  . simvastatin (ZOCOR) 40 MG tablet TAKE ONE TABLET BY MOUTH ONCE DAILY AT BEDTIME 90 tablet 3   No current facility-administered medications on file prior to visit.     Allergies  Allergen Reactions  . Vicodin [Hydrocodone-Acetaminophen] Nausea Only    Family History  Problem Relation Age of Onset  . Heart disease Mother   . Diabetes Father   . Heart disease Paternal Grandfather   . Colon cancer Maternal Aunt   . Thyroid disease Neg Hx     BP 128/80   Pulse 84   Ht 5\' 1"  (1.549 m)   Wt 136 lb (61.7 kg)   SpO2 97%   BMI 25.70 kg/m   Review of Systems Denies fever.      Objective:   Physical Exam VS: see vs page.   GEN: no distress.   NECK: thyroid is slightly and diffusely enlarged.    Lab Results  Component Value Date   TSH 4.55 (H) 07/06/2016      Assessment & Plan:  Hyperthyroidism: overcontrolled.  stop taking the methimazole. Then, in approx 1 week, resume at just 1 pill per day.

## 2016-07-06 ENCOUNTER — Encounter: Payer: Self-pay | Admitting: Endocrinology

## 2016-07-06 ENCOUNTER — Ambulatory Visit (INDEPENDENT_AMBULATORY_CARE_PROVIDER_SITE_OTHER): Payer: Medicare Other | Admitting: Endocrinology

## 2016-07-06 VITALS — BP 128/80 | HR 84 | Ht 61.0 in | Wt 136.0 lb

## 2016-07-06 DIAGNOSIS — E0590 Thyrotoxicosis, unspecified without thyrotoxic crisis or storm: Secondary | ICD-10-CM | POA: Diagnosis not present

## 2016-07-06 LAB — T4, FREE: FREE T4: 0.59 ng/dL — AB (ref 0.60–1.60)

## 2016-07-06 LAB — TSH: TSH: 4.55 u[IU]/mL — ABNORMAL HIGH (ref 0.35–4.50)

## 2016-07-06 MED ORDER — METHIMAZOLE 10 MG PO TABS: 10.0000 mg | ORAL_TABLET | Freq: Every day | ORAL | 5 refills | Status: DC

## 2016-07-06 NOTE — Patient Instructions (Addendum)
blood tests are requested for you today.  We'll let you know about the results. If ever you have fever while taking methimazole, stop it and call us, even if the reason is obvious, because of the risk of a rare side-effect. Please come back for a follow-up appointment in 4 months.  

## 2016-08-11 ENCOUNTER — Ambulatory Visit (INDEPENDENT_AMBULATORY_CARE_PROVIDER_SITE_OTHER): Payer: Medicare Other | Admitting: *Deleted

## 2016-08-11 DIAGNOSIS — I442 Atrioventricular block, complete: Secondary | ICD-10-CM

## 2016-08-11 NOTE — Progress Notes (Signed)
Remote pacemaker transmission.   

## 2016-08-12 ENCOUNTER — Encounter: Payer: Self-pay | Admitting: Cardiology

## 2016-08-12 LAB — CUP PACEART REMOTE DEVICE CHECK
Brady Statistic AS VP Percent: 91.1 %
Brady Statistic AS VS Percent: 0.1 %
Date Time Interrogation Session: 20180222150846
Implantable Lead Implant Date: 20110208
Implantable Lead Location: 753859
Lead Channel Impedance Value: 441 Ohm
Lead Channel Impedance Value: 513 Ohm
Lead Channel Pacing Threshold Pulse Width: 0.4 ms
Lead Channel Sensing Intrinsic Amplitude: 2.8 mV
MDC IDC LEAD IMPLANT DT: 20110208
MDC IDC LEAD LOCATION: 753860
MDC IDC MSMT LEADCHNL RA PACING THRESHOLD AMPLITUDE: 0.375 V
MDC IDC MSMT LEADCHNL RA PACING THRESHOLD PULSEWIDTH: 0.4 ms
MDC IDC MSMT LEADCHNL RV PACING THRESHOLD AMPLITUDE: 0.625 V
MDC IDC PG IMPLANT DT: 20110208
MDC IDC STAT BRADY AP VP PERCENT: 8.8 %
MDC IDC STAT BRADY AP VS PERCENT: 0.1 %

## 2016-09-09 DIAGNOSIS — Z6826 Body mass index (BMI) 26.0-26.9, adult: Secondary | ICD-10-CM | POA: Diagnosis not present

## 2016-09-09 DIAGNOSIS — Z1389 Encounter for screening for other disorder: Secondary | ICD-10-CM | POA: Diagnosis not present

## 2016-09-09 DIAGNOSIS — J0100 Acute maxillary sinusitis, unspecified: Secondary | ICD-10-CM | POA: Diagnosis not present

## 2016-09-09 DIAGNOSIS — E663 Overweight: Secondary | ICD-10-CM | POA: Diagnosis not present

## 2016-11-01 DIAGNOSIS — E119 Type 2 diabetes mellitus without complications: Secondary | ICD-10-CM | POA: Diagnosis not present

## 2016-11-02 ENCOUNTER — Encounter: Payer: Self-pay | Admitting: Endocrinology

## 2016-11-02 ENCOUNTER — Ambulatory Visit (INDEPENDENT_AMBULATORY_CARE_PROVIDER_SITE_OTHER): Payer: Medicare Other | Admitting: Endocrinology

## 2016-11-02 VITALS — BP 108/62 | HR 60 | Ht 61.0 in | Wt 138.0 lb

## 2016-11-02 DIAGNOSIS — E0590 Thyrotoxicosis, unspecified without thyrotoxic crisis or storm: Secondary | ICD-10-CM | POA: Diagnosis not present

## 2016-11-02 LAB — T4, FREE: FREE T4: 0.86 ng/dL (ref 0.60–1.60)

## 2016-11-02 LAB — TSH: TSH: 6.44 u[IU]/mL — AB (ref 0.35–4.50)

## 2016-11-02 MED ORDER — METHIMAZOLE 5 MG PO TABS: 5.0000 mg | ORAL_TABLET | Freq: Every day | ORAL | 3 refills | Status: DC

## 2016-11-02 NOTE — Patient Instructions (Addendum)
blood tests are requested for you today.  We'll let you know about the results. If ever you have fever while taking methimazole, stop it and call us, even if the reason is obvious, because of the risk of a rare side-effect. Please come back for a follow-up appointment in 4 months.  

## 2016-11-02 NOTE — Progress Notes (Signed)
Subjective:    Patient ID: [name], female    DOB: 1950-07-17, 66 y.o.   MRN: [id number]  HPI Pt returns for f/u of hyperthyroidism (dx'ed 2017; she has never had thyroid imaging, but Grave's Dz is suggested by physical exam; she declines RAI).  Since on tapazole, she feels well in general.  Past Medical History:  Diagnosis Date  . Arthritis   . Atrial tachycardia ([place])   . Atypical chest pain   . Bundle branch block left    Dr. Rollene Fare is cardiologist  . Elevated cholesterol   . Hypertension   . Osteopenia 09/2013   T score -1.9   . Pacemaker   . Paroxysmal atrial fibrillation ([facility])    rrare episodes on pacemaker interrogation  . Wears glasses     Past Surgical History:  Procedure Laterality Date  . CARDIAC CATHETERIZATION  2005   Ejection fraction is estimated at  greater than or equal to 60 %  Wall motion is normal.  . CAROTID DUPLEX BILATERAL  march 08,2005   Impression : No evidence of of signficant plaque formation or stenosis.  . CARPAL TUNNEL RELEASE  04/30/2011   Procedure: CARPAL TUNNEL RELEASE;  Surgeon: Cammie Sickle., MD;  Location: [facility];  Service: Orthopedics;  Laterality: Left;  left carpal tunnel release, inject left thumb  . CARPAL TUNNEL RELEASE  04/28/2012   Procedure: CARPAL TUNNEL RELEASE;  Surgeon: Cammie Sickle., MD;  Location: [facility];  Service: Orthopedics;  Laterality: Right;  . COLONOSCOPY    . COLONOSCOPY  04/14/2012   Procedure: COLONOSCOPY;  Surgeon: Daneil Dolin, MD;  [facility];  Service: Endoscopy;  Laterality: N/A;  8:30 AM  . DORSAL COMPARTMENT RELEASE  04/30/2011   Procedure: RELEASE DORSAL COMPARTMENT (DEQUERVAIN);  Surgeon: Cammie Sickle., MD;  Location: [facility];  Service: Orthopedics;  Laterality: Left;  release 1st dorsal compartment on left  . INSERT / REPLACE / REMOVE PACEMAKER  07/29/2009   PPM-Dual Chamber Systems Fluoro Guidance Venogram  performed 10 ml 's to the left subclavian vein  . PACEMAKER INSERTION  07/2009   Dr. Rollene Fare  Pacemaker is a Adapta L implanted 07/2009  . RENAL CIRCULATION  07/ 08/ 08   normal renal duplex doppler   . TRANSTHORACIC ECHOCARDIOGRAM  10/18/2012   Atrial tracking & V paced rhythm with pacer induced LBBB. No significant intra -ventricular  dyssynchrony.  . TUBAL LIGATION  1986    Social History   Social History  . Marital status: Married    Spouse name: N/A  . Number of children: N/A  . Years of education: N/A   Occupational History  . Not on file.   Social History Main Topics  . Smoking status: Former Smoker    Packs/day: 1.50    Years: 30.00    Types: Cigarettes  . Smokeless tobacco: Never Used  . Alcohol use 4.2 oz/week    7 Glasses of wine per week  . Drug use: No  . Sexual activity: No     Comment: Tubal lig-1st intercourse 66 yo-Fewer than 5 partners   Other Topics Concern  . Not on file   Social History Narrative  . No narrative on file    Current Outpatient Prescriptions on File Prior to Visit  Medication Sig Dispense Refill  . aspirin 81 MG tablet Take 81 mg by mouth daily.    . butalbital-acetaminophen-caffeine (FIORICET, [place]) 50-325-40 MG  tablet Take 1 tablet by mouth 3 times/day as needed-between meals & bedtime for headache.    . calcium carbonate (OS-CAL) 600 MG TABS Take 600 mg by mouth 2 (two) times daily with a meal.      . ibuprofen (ADVIL,MOTRIN) 200 MG tablet Take 200 mg by mouth every 6 (six) hours as needed. Just occasional    . lisinopril-hydrochlorothiazide (PRINZIDE,ZESTORETIC) 20-12.5 MG per tablet Take 1 tablet by mouth daily.      . metoprolol succinate (TOPROL-XL) 50 MG 24 hr tablet Take 1 tablet (50 mg total) by mouth daily. Take with or immediately following a meal. 30 tablet 11  . Multiple Vitamin (MULTIVITAMIN) capsule Take 1 capsule by mouth daily.      . naproxen sodium (ALEVE) 220 MG tablet Take 220 mg by mouth daily as needed  (pain).    Marland Kitchen OVER THE COUNTER MEDICATION Take 1 tablet by mouth 2 (two) times daily. Med Name: [place]    . oxymetazoline (AFRIN) 0.05 % nasal spray Place 2 sprays into the nose at bedtime.    . rivaroxaban (XARELTO) 20 MG TABS tablet Take 1 tablet (20 mg total) by mouth daily with supper. 30 tablet 5  . simvastatin (ZOCOR) 40 MG tablet TAKE ONE TABLET BY MOUTH ONCE DAILY AT BEDTIME 90 tablet 3   No current facility-administered medications on file prior to visit.     Allergies  Allergen Reactions  . Vicodin [Hydrocodone-Acetaminophen] Nausea Only    Family History  Problem Relation Age of Onset  . Heart disease Mother   . Diabetes Father   . Heart disease Paternal Grandfather   . Colon cancer Maternal Aunt   . Thyroid disease Neg Hx     BP 108/62   Pulse 60   Ht 5\' 1"  (1.549 m)   Wt 138 lb (62.6 kg)   SpO2 97%   BMI 26.07 kg/m    Review of Systems Denies fever.     Objective:   Physical Exam VS: see vs page.   GEN: no distress.   NECK: thyroid is slightly and diffusely enlarged.  Lab Results  Component Value Date   TSH 6.44 (H) 11/02/2016      Assessment & Plan:  Hyperthyroidism: slightly overcontrolled. Reduce tapazole.

## 2016-11-08 ENCOUNTER — Telehealth: Payer: Self-pay | Admitting: Endocrinology

## 2016-11-08 NOTE — Telephone Encounter (Signed)
[facility] called to inquire if medical records were received. Please call number provided to advise today.  ID Number is: [id number]

## 2016-11-08 NOTE — Telephone Encounter (Signed)
I contacted the patient and advised the medical records need to come from our medical records department. Phone number and fax number provided.

## 2016-11-16 ENCOUNTER — Ambulatory Visit (INDEPENDENT_AMBULATORY_CARE_PROVIDER_SITE_OTHER): Payer: Medicare Other | Admitting: *Deleted

## 2016-11-16 DIAGNOSIS — I442 Atrioventricular block, complete: Secondary | ICD-10-CM | POA: Diagnosis not present

## 2016-11-16 NOTE — Progress Notes (Signed)
Remote pacemaker transmission.   

## 2016-11-17 LAB — CUP PACEART REMOTE DEVICE CHECK
Battery Impedance: 749 Ohm
Battery Remaining Longevity: 73 mo
Battery Voltage: 2.8 V
Brady Statistic AP VP Percent: 9 %
Implantable Lead Implant Date: 20110208
Implantable Lead Location: 753859
Implantable Lead Model: 5076
Implantable Pulse Generator Implant Date: 20110208
Lead Channel Impedance Value: 460 Ohm
Lead Channel Impedance Value: 512 Ohm
Lead Channel Setting Pacing Amplitude: 1.5 V
Lead Channel Setting Pacing Amplitude: 2.25 V
Lead Channel Setting Pacing Pulse Width: 0.4 ms
Lead Channel Setting Sensing Sensitivity: 2.8 mV
MDC IDC LEAD IMPLANT DT: 20110208
MDC IDC LEAD LOCATION: 753860
MDC IDC MSMT LEADCHNL RA PACING THRESHOLD AMPLITUDE: 0.375 V
MDC IDC MSMT LEADCHNL RA PACING THRESHOLD PULSEWIDTH: 0.4 ms
MDC IDC MSMT LEADCHNL RV PACING THRESHOLD AMPLITUDE: 0.625 V
MDC IDC MSMT LEADCHNL RV PACING THRESHOLD PULSEWIDTH: 0.4 ms
MDC IDC SESS DTM: 20180529111441
MDC IDC STAT BRADY AP VS PERCENT: 0 %
MDC IDC STAT BRADY AS VP PERCENT: 91 %
MDC IDC STAT BRADY AS VS PERCENT: 0 %

## 2016-11-23 ENCOUNTER — Encounter: Payer: Self-pay | Admitting: Cardiology

## 2016-12-15 ENCOUNTER — Ambulatory Visit (INDEPENDENT_AMBULATORY_CARE_PROVIDER_SITE_OTHER): Payer: Medicare Other | Admitting: Gynecology

## 2016-12-15 ENCOUNTER — Encounter: Payer: Self-pay | Admitting: Gynecology

## 2016-12-15 VITALS — BP 122/78 | Ht 61.5 in | Wt 137.0 lb

## 2016-12-15 DIAGNOSIS — Z124 Encounter for screening for malignant neoplasm of cervix: Secondary | ICD-10-CM

## 2016-12-15 DIAGNOSIS — Z01411 Encounter for gynecological examination (general) (routine) with abnormal findings: Secondary | ICD-10-CM | POA: Diagnosis not present

## 2016-12-15 DIAGNOSIS — N952 Postmenopausal atrophic vaginitis: Secondary | ICD-10-CM

## 2016-12-15 DIAGNOSIS — M8580 Other specified disorders of bone density and structure, unspecified site: Secondary | ICD-10-CM

## 2016-12-15 NOTE — Progress Notes (Signed)
    [name] 1950/10/13 [id number]        66 y.o.  G2P2002 for breast and pelvic exam.  Past medical history,surgical history, problem list, medications, allergies, family history and social history were all reviewed and documented as reviewed in the EPIC chart.  ROS:  Performed with pertinent positives and negatives included in the history, assessment and plan.   Additional significant findings :  None   Exam: [name] assistant Vitals:   12/15/16 0955  BP: 122/78  Weight: 137 lb (62.1 kg)  Height: 5' 1.5" (1.562 m)   Body mass index is 25.47 kg/m.  General appearance:  Normal affect, orientation and appearance. Skin: Grossly normal HEENT: Without gross lesions.  No cervical or supraclavicular adenopathy. Thyroid normal.  Lungs:  Clear without wheezing, rales or rhonchi Cardiac: RR, without RMG Abdominal:  Soft, nontender, without masses, guarding, rebound, organomegaly or hernia Breasts:  Examined lying and sitting without masses, retractions, discharge or axillary adenopathy. Pelvic:  Ext, BUS, Vagina: With atrophic changes  Cervix: With atrophic changes. Pap smear done  Uterus: Anteverted, normal size, shape and contour, midline and mobile nontender   Adnexa: Without masses or tenderness    Anus and perineum: Normal   Rectovaginal: Normal sphincter tone without palpated masses or tenderness.    Assessment/Plan:  66 y.o. [id number] female for breast and pelvic exam.   1. Postmenopausal/atrophic genital changes. No significant hot flushes, night sweats, vaginal dryness or any vaginal bleeding. Continue to monitor and report any issues or bleeding. 2. Mammography 03/2016. Continue with annual mammography when due. SBE monthly reviewed. Breast exam normal today. 3. Pap smear/HPV 2014. Pap smear done today. No history of abnormal Pap smears previously. Options to stop screening per current screening guidelines reviewed. Will readdress on an annual basis. 4. Colonoscopy  2013. Repeat at their recommended interval. 5. Osteopenia. DEXA 2017 T score -1.5. DEXA 2015 T score -1.9.  Recommended patient have vitamin D level checked the next time she has blood work done at her primary physician's office and she agrees to arrange this. Plan repeat DEXA in 1-2 years.  6. Health maintenance. No routine lab work done as patient does this elsewhere. Follow up 1 year, sooner as needed.   Anastasio Auerbach MD, 10:23 AM 12/15/2016

## 2016-12-15 NOTE — Patient Instructions (Signed)
Have a vitamin D level checked the next time that your primary physician's do blood work.  Follow up in one year for annual gynecologic exam

## 2016-12-15 NOTE — Addendum Note (Signed)
Addended by: Nelva Nay on: 12/15/2016 10:46 AM   Modules accepted: Orders

## 2016-12-17 LAB — PAP IG (IMAGE GUIDED)

## 2016-12-18 DIAGNOSIS — S91112A Laceration without foreign body of left great toe without damage to nail, initial encounter: Secondary | ICD-10-CM | POA: Diagnosis not present

## 2017-01-04 ENCOUNTER — Other Ambulatory Visit: Payer: Self-pay | Admitting: Cardiovascular Disease

## 2017-01-04 NOTE — Telephone Encounter (Signed)
Rx has been sent to the pharmacy electronically. ° °

## 2017-02-09 ENCOUNTER — Ambulatory Visit (INDEPENDENT_AMBULATORY_CARE_PROVIDER_SITE_OTHER): Payer: Medicare Other | Admitting: Cardiovascular Disease

## 2017-02-09 ENCOUNTER — Encounter: Payer: Self-pay | Admitting: Cardiovascular Disease

## 2017-02-09 VITALS — BP 110/68 | HR 70 | Ht 61.0 in | Wt 137.0 lb

## 2017-02-09 DIAGNOSIS — I10 Essential (primary) hypertension: Secondary | ICD-10-CM | POA: Diagnosis not present

## 2017-02-09 DIAGNOSIS — I442 Atrioventricular block, complete: Secondary | ICD-10-CM

## 2017-02-09 DIAGNOSIS — R001 Bradycardia, unspecified: Secondary | ICD-10-CM | POA: Diagnosis not present

## 2017-02-09 DIAGNOSIS — I447 Left bundle-branch block, unspecified: Secondary | ICD-10-CM

## 2017-02-09 DIAGNOSIS — I480 Paroxysmal atrial fibrillation: Secondary | ICD-10-CM

## 2017-02-09 DIAGNOSIS — Z950 Presence of cardiac pacemaker: Secondary | ICD-10-CM

## 2017-02-09 DIAGNOSIS — E559 Vitamin D deficiency, unspecified: Secondary | ICD-10-CM | POA: Diagnosis not present

## 2017-02-09 DIAGNOSIS — E785 Hyperlipidemia, unspecified: Secondary | ICD-10-CM

## 2017-02-09 DIAGNOSIS — Z79899 Other long term (current) drug therapy: Secondary | ICD-10-CM | POA: Diagnosis not present

## 2017-02-09 NOTE — Patient Instructions (Signed)
Dr Sallyanne Kuster recommends that you continue on your current medications as directed. Please refer to the Current Medication list given to you today.  Your physician recommends that you return for lab work at your earliest [place].  Remote monitoring is used to monitor your Pacemaker or ICD from home. This monitoring reduces the number of office visits required to check your device to one time per year. It allows [place] to keep an eye on the functioning of your device to ensure it is working properly. You are scheduled for a device check from home on Wednesday, November 21st, 2018. You may send your transmission at any time that day. If you have a wireless device, the transmission will be sent automatically. After your physician reviews your transmission, you will receive a notification with your next transmission date.  Dr Sallyanne Kuster recommends that you schedule a follow-up appointment in 12 months with a pacemaker check. You will receive a reminder letter in the mail two months in advance. If you don't receive a letter, please call our office to schedule the follow-up appointment.  If you need a refill on your cardiac medications before your next appointment, please call your pharmacy.

## 2017-02-09 NOTE — Progress Notes (Signed)
Cardiology Office Note    Date:  02/09/2017   ID:  [name], [name] 29-Jul-1950, MRN [id number]  PCP:  Sharilyn Sites, MD  Cardiologist:  Quay Burow, MD: Sanda Klein, MD   Chief Complaint  Patient presents with  . Follow-up  Pacemaker/complete heart block/atrial fibrillation  History of Present Illness:  [name] is a 66 y.o. female with complete heart block, paroxysmal atrial fibrillation, hypertension, hyperlipidemia returning for follow-up.  Her device was implanted for symptomatic bradycardia and she has a history of left bundle branch block. She subsequently has developed complete heart block. Her Medtronic Adapta device was implanted in 2011 and has roughly 6 years of estimated generator longevity remaining. There is 9% atrial pacing and 100% ventricular pacing. Since her last office pacemaker check the overall burden of atrial fibrillation has been 0.4% in the last meaningful episode of arrhythmia was recorded in October 2017 and lasted for over 12 hours. Lead parameters are good. In addition she has occasional brief episodes of regular paroxysmal atrial tachycardia, lasting 2-3 minutes.   She complains about the high cost of the Xarelto and has had a couple of episodes of conjunctival hemorrhage, but no serious bleeding complications. Otherwise she has no cardiac complaints. She had a very enjoyable summer with her grandchildren at the beach and took another beach trip with her friends.  She has well-controlled hypertension and takes a statin for hyperlipidemia, with labs monitored via Dr. Hilma Favors. She had a normal nuclear perfusion stress test in May 2017  Past Medical History:  Diagnosis Date  . Arthritis   . Atrial tachycardia ([place])   . Atypical chest pain   . Bundle branch block left    Dr. Rollene Fare is cardiologist  . Elevated cholesterol   . Hypertension   . Osteopenia 2017   T score -1.5  . Pacemaker   . Paroxysmal atrial fibrillation (HCC)    rrare  episodes on pacemaker interrogation  . Wears glasses     Past Surgical History:  Procedure Laterality Date  . CARDIAC CATHETERIZATION  2005   Ejection fraction is estimated at  greater than or equal to 60 %  Wall motion is normal.  . CAROTID DUPLEX BILATERAL  march 08,2005   Impression : No evidence of of signficant plaque formation or stenosis.  . CARPAL TUNNEL RELEASE  04/30/2011   Procedure: CARPAL TUNNEL RELEASE;  Surgeon: Cammie Sickle., MD;  Location: [place];  Service: Orthopedics;  Laterality: Left;  left carpal tunnel release, inject left thumb  . CARPAL TUNNEL RELEASE  04/28/2012   Procedure: CARPAL TUNNEL RELEASE;  Surgeon: Cammie Sickle., MD;  Location: [facility];  Service: Orthopedics;  Laterality: Right;  . COLONOSCOPY    . COLONOSCOPY  04/14/2012   Procedure: COLONOSCOPY;  Surgeon: Daneil Dolin, MD;  [facility];  Service: Endoscopy;  Laterality: N/A;  8:30 AM  . DORSAL COMPARTMENT RELEASE  04/30/2011   Procedure: RELEASE DORSAL COMPARTMENT (DEQUERVAIN);  Surgeon: Cammie Sickle., MD;  Location[facility];  Service: Orthopedics;  Laterality: Left;  release 1st dorsal compartment on left  . INSERT / REPLACE / REMOVE PACEMAKER  07/29/2009   PPM-Dual Chamber Systems Fluoro Guidance Venogram performed 10 ml 's to the left subclavian vein  . PACEMAKER INSERTION  07/2009   Dr. Rollene Fare  Pacemaker is a Adapta L implanted 07/2009  . RENAL CIRCULATION  07/ 08/ 08   normal renal  duplex doppler   . TRANSTHORACIC ECHOCARDIOGRAM  10/18/2012   Atrial tracking & V paced rhythm with pacer induced LBBB. No significant intra -ventricular  dyssynchrony.  . TUBAL LIGATION  1986    Current Medications: Outpatient Medications Prior to Visit  Medication Sig Dispense Refill  . aspirin 81 MG tablet Take 81 mg by mouth daily.    . butalbital-acetaminophen-caffeine (FIORICET, ESGIC) 50-325-40 MG tablet Take 1 tablet by  mouth 3 times/day as needed-between meals & bedtime for headache.    . ibuprofen (ADVIL,MOTRIN) 200 MG tablet Take 200 mg by mouth every 6 (six) hours as needed. Just occasional    . lisinopril-hydrochlorothiazide (PRINZIDE,ZESTORETIC) 20-12.5 MG per tablet Take 1 tablet by mouth daily.      . methimazole (TAPAZOLE) 5 MG tablet Take 1 tablet (5 mg total) by mouth daily. 90 tablet 3  . metoprolol succinate (TOPROL-XL) 50 MG 24 hr tablet Take 1 tablet (50 mg total) by mouth daily. Take with or immediately following a meal. 30 tablet 11  . Multiple Vitamin (MULTIVITAMIN) capsule Take 1 capsule by mouth daily.      . naproxen sodium (ALEVE) 220 MG tablet Take 220 mg by mouth daily as needed (pain).    Marland Kitchen OVER THE COUNTER MEDICATION Take 1 tablet by mouth 2 (two) times daily. Med Name: [place]    . oxymetazoline (AFRIN) 0.05 % nasal spray Place 2 sprays into the nose 2 (two) times daily as needed.     . simvastatin (ZOCOR) 40 MG tablet TAKE ONE TABLET BY MOUTH ONCE DAILY AT BEDTIME 90 tablet 3  . XARELTO 20 MG TABS tablet TAKE ONE TABLET BY MOUTH DAILY WITH SUPPER 30 tablet 11  . calcium carbonate (OS-CAL) 600 MG TABS Take 600 mg by mouth 2 (two) times daily with a meal.       No facility-administered medications prior to visit.      Allergies:   Vicodin [hydrocodone-acetaminophen]   Social History   Social History  . Marital status: Married    Spouse name: N/A  . Number of children: N/A  . Years of education: N/A   Social History Main Topics  . Smoking status: Former Smoker    Packs/day: 1.50    Years: 30.00    Types: Cigarettes  . Smokeless tobacco: Never Used  . Alcohol use 4.2 oz/week    7 Glasses of wine per week  . Drug use: No  . Sexual activity: No     Comment: Tubal lig-1st intercourse 66 yo-Fewer than 5 partners   Other Topics Concern  . Not on file   Social History Narrative  . No narrative on file     Family History:  The patient's family  history includes Colon cancer in her maternal aunt; Diabetes in her father; Heart disease in her mother and paternal grandfather.   ROS:   Please see the history of present illness.    ROS All other systems reviewed and are negative.   PHYSICAL EXAM:   VS:  BP 110/68   Pulse 70   Ht 5\' 1"  (1.549 m)   Wt 137 lb (62.1 kg)   BMI 25.89 kg/m    GEN: Well nourished, well developed, in no acute distress  General: Alert, oriented x3 Head: no evidence of trauma, PERRL, EOMI, no exophtalmos or lid lag, no myxedema, no xanthelasma; normal ears, nose and oropharynx Neck: normal jugular venous pulsations and no hepatojugular reflux; brisk carotid pulses without delay and  no carotid bruits Chest: clear to auscultation, no signs of consolidation by percussion or palpation, normal fremitus, symmetrical and full respiratory excursions Cardiovascular: normal position and quality of the apical impulse, regular rhythm, normal first and paradoxically split second heart sounds, no murmurs, rubs or gallops, healthy subclavian pacemaker site Abdomen: no tenderness or distention, no masses by palpation, no abnormal pulsatility or arterial bruits, normal bowel sounds, no hepatosplenomegaly Extremities: no clubbing, cyanosis or edema; 2+ radial, ulnar and brachial pulses bilaterally; 2+ right femoral, posterior tibial and dorsalis pedis pulses; 2+ left femoral, posterior tibial and dorsalis pedis pulses; no subclavian or femoral bruits Neurological: grossly nonfocal Psych: euthymic mood, full affect  Wt Readings from Last 3 Encounters:  02/09/17 137 lb (62.1 kg)  12/15/16 137 lb (62.1 kg)  11/02/16 138 lb (62.6 kg)      Studies/Labs Reviewed:   EKG:  EKG is ordered today.  The ekg ordered today demonstrates Atrial sensed (sinus), ventricular paced rhythm  Recent Labs: 03/13/2016: ALT 33; Hemoglobin 13.5; Platelets 205 06/30/2016: BUN 23; Creat 0.93; Potassium 4.1; Sodium 141 11/02/2016: TSH 6.44   Lipid  Panel    Component Value Date/Time   CHOL 169 03/28/2015 0859   TRIG 136 03/28/2015 0859   HDL 51 03/28/2015 0859   CHOLHDL 3.3 03/28/2015 0859   VLDL 27 03/28/2015 0859   LDLCALC 91 03/28/2015 0859     ASSESSMENT:    1. CHB (complete heart block) ([place])   2. Pacemaker   3. Paroxysmal atrial fibrillation (HCC)   4. Essential hypertension   5. Dyslipidemia   6. Medication management   7. Vitamin D deficiency, unspecified      PLAN:  In order of problems listed above:  1. CHB: Pacemaker dependent. There is no underlying escape rhythm 2. PPM: Normal function of her Medtronic Adapta dual-chamber device. Continue downloads via remote transmitter 3 months and office visit yearly 3. AFib: The overall prevalence of arrhythmia is very low, but her episodes are lengthy, including episodes that last more than 12 hours. CHADVasc score of 3 (age, gender, HTN). We'll look to see if she qualifies for patient assistance with her anticoagulant or whether would be cheaper for her to switch to Eliquis. Make sure she is not taking aspirin anymore she is on the anticoagulant. 4. HTN: Excellent control 5. HLP: on statin therapy, labs performed at [facility], due for a recheck soon. She states that she usually has a vitamin D level checked as well.    Medication Adjustments/Labs and Tests Ordered: Current medicines are reviewed at length with the patient today.  Concerns regarding medicines are outlined above.  Medication changes, Labs and Tests ordered today are listed in the Patient Instructions below. Patient Instructions  Dr Sallyanne Kuster recommends that you continue on your current medications as directed. Please refer to the Current Medication list given to you today.  Your physician recommends that you return for lab work at your earliest [place].  Remote monitoring is used to monitor your Pacemaker or ICD from home. This monitoring reduces the number of office visits required  to check your device to one time per year. It allows [place] to keep an eye on the functioning of your device to ensure it is working properly. You are scheduled for a device check from home on Wednesday, November 21st, 2018. You may send your transmission at any time that day. If you have a wireless device, the transmission will be sent automatically. After your physician reviews your transmission,  you will receive a notification with your next transmission date.  Dr Sallyanne Kuster recommends that you schedule a follow-up appointment in 12 months with a pacemaker check. You will receive a reminder letter in the mail two months in advance. If you don't receive a letter, please call our office to schedule the follow-up appointment.  If you need a refill on your cardiac medications before your next appointment, please call your pharmacy.    Signed, Sanda Klein, MD  02/09/2017 11:25 AM    [address], [place],   [address] Phone: [phone]; Fax: [phone]

## 2017-02-14 ENCOUNTER — Other Ambulatory Visit: Payer: Self-pay | Admitting: Cardiovascular Disease

## 2017-02-14 ENCOUNTER — Other Ambulatory Visit (HOSPITAL_COMMUNITY): Payer: Self-pay | Admitting: Family Medicine

## 2017-02-14 DIAGNOSIS — Z1231 Encounter for screening mammogram for malignant neoplasm of breast: Secondary | ICD-10-CM

## 2017-03-04 ENCOUNTER — Encounter: Payer: Self-pay | Admitting: Endocrinology

## 2017-03-04 ENCOUNTER — Other Ambulatory Visit (INDEPENDENT_AMBULATORY_CARE_PROVIDER_SITE_OTHER): Payer: Medicare Other

## 2017-03-04 ENCOUNTER — Ambulatory Visit (INDEPENDENT_AMBULATORY_CARE_PROVIDER_SITE_OTHER): Payer: Medicare Other | Admitting: Endocrinology

## 2017-03-04 VITALS — BP 122/68 | HR 69 | Wt 137.6 lb

## 2017-03-04 DIAGNOSIS — E0590 Thyrotoxicosis, unspecified without thyrotoxic crisis or storm: Secondary | ICD-10-CM

## 2017-03-04 LAB — T4, FREE: FREE T4: 0.77 ng/dL (ref 0.60–1.60)

## 2017-03-04 LAB — TSH: TSH: 5.01 u[IU]/mL — ABNORMAL HIGH (ref 0.35–4.50)

## 2017-03-04 MED ORDER — METHIMAZOLE 5 MG PO TABS: 5.0000 mg | ORAL_TABLET | ORAL | 3 refills | Status: DC

## 2017-03-04 NOTE — Patient Instructions (Signed)
blood tests are requested for you today.  We'll let you know about the results. Please come back for a follow-up appointment in 4 months.     

## 2017-03-04 NOTE — Progress Notes (Signed)
Subjective:    Patient ID: [name], female    DOB: 07-Dec-1950, 66 y.o.   MRN: [id number]  HPI Pt returns for f/u of hyperthyroidism (dx'ed 2017; she has never had thyroid imaging, but Grave's Dz is suggested by physical exam; she declines RAI).  Since on tapazole, she feels well in general.   Past Medical History:  Diagnosis Date  . Arthritis   . Atrial tachycardia ([place])   . Atypical chest pain   . Bundle branch block left    Dr. Rollene Fare is cardiologist  . Elevated cholesterol   . Hypertension   . Osteopenia 2017   T score -1.5  . Pacemaker   . Paroxysmal atrial fibrillation (HCC)    rrare episodes on pacemaker interrogation  . Wears glasses     Past Surgical History:  Procedure Laterality Date  . CARDIAC CATHETERIZATION  2005   Ejection fraction is estimated at  greater than or equal to 60 %  Wall motion is normal.  . CAROTID DUPLEX BILATERAL  march 08,2005   Impression : No evidence of of signficant plaque formation or stenosis.  . CARPAL TUNNEL RELEASE  04/30/2011   Procedure: CARPAL TUNNEL RELEASE;  Surgeon: Cammie Sickle., MD;  Location: [place];  Service: Orthopedics;  Laterality: Left;  left carpal tunnel release, inject left thumb  . CARPAL TUNNEL RELEASE  04/28/2012   Procedure: CARPAL TUNNEL RELEASE;  Surgeon: Cammie Sickle., MD;  Location: [place];  Service: Orthopedics;  Laterality: Right;  . COLONOSCOPY    . COLONOSCOPY  04/14/2012   Procedure: COLONOSCOPY;  Surgeon: Daneil Dolin, MD;  [facility];  Service: Endoscopy;  Laterality: N/A;  8:30 AM  . DORSAL COMPARTMENT RELEASE  04/30/2011   Procedure: RELEASE DORSAL COMPARTMENT (DEQUERVAIN);  Surgeon: Cammie Sickle., MD;  Location: [facility];  Service: Orthopedics;  Laterality: Left;  release 1st dorsal compartment on left  . INSERT / REPLACE / REMOVE PACEMAKER  07/29/2009   PPM-Dual Chamber Systems Fluoro Guidance Venogram  performed 10 ml 's to the left subclavian vein  . PACEMAKER INSERTION  07/2009   Dr. Rollene Fare  Pacemaker is a Adapta L implanted 07/2009  . RENAL CIRCULATION  07/ 08/ 08   normal renal duplex doppler   . TRANSTHORACIC ECHOCARDIOGRAM  10/18/2012   Atrial tracking & V paced rhythm with pacer induced LBBB. No significant intra -ventricular  dyssynchrony.  . TUBAL LIGATION  1986    Social History   Social History  . Marital status: Married    Spouse name: N/A  . Number of children: N/A  . Years of education: N/A   Occupational History  . Not on file.   Social History Main Topics  . Smoking status: Former Smoker    Packs/day: 1.50    Years: 30.00    Types: Cigarettes  . Smokeless tobacco: Never Used  . Alcohol use 4.2 oz/week    7 Glasses of wine per week  . Drug use: No  . Sexual activity: No     Comment: Tubal lig-1st intercourse 66 yo-Fewer than 5 partners   Other Topics Concern  . Not on file   Social History Narrative  . No narrative on file    Current Outpatient Prescriptions on File Prior to Visit  Medication Sig Dispense Refill  . aspirin 81 MG tablet Take 81 mg by mouth daily.    . butalbital-acetaminophen-caffeine (FIORICET, ESGIC) 50-325-40 MG  tablet Take 1 tablet by mouth 3 times/day as needed-between meals & bedtime for headache.    . Calcium Carb-Cholecalciferol (CALCIUM-VITAMIN D) 600-400 MG-UNIT TABS Take 2 tablets by mouth daily.    Marland Kitchen ibuprofen (ADVIL,MOTRIN) 200 MG tablet Take 200 mg by mouth every 6 (six) hours as needed. Just occasional    . lisinopril-hydrochlorothiazide (PRINZIDE,ZESTORETIC) 20-12.5 MG per tablet Take 1 tablet by mouth daily.      . metoprolol succinate (TOPROL-XL) 50 MG 24 hr tablet TAKE ONE TABLET BY MOUTH ONCE DAILY. TAKE WITH OR IMMEDIATELY FOLLOWING A MEAL 90 tablet 3  . Multiple Vitamin (MULTIVITAMIN) capsule Take 1 capsule by mouth daily.      . Multiple Vitamins-Minerals (WOMENS 50+ MULTI VITAMIN/MIN PO) Take 1 tablet by mouth  daily.    . naproxen sodium (ALEVE) 220 MG tablet Take 220 mg by mouth daily as needed (pain).    Marland Kitchen OVER THE COUNTER MEDICATION Take 1 tablet by mouth 2 (two) times daily. Med Name: [place]    . oxymetazoline (AFRIN) 0.05 % nasal spray Place 2 sprays into the nose 2 (two) times daily as needed.     . simvastatin (ZOCOR) 40 MG tablet TAKE ONE TABLET BY MOUTH ONCE DAILY AT BEDTIME 90 tablet 3  . XARELTO 20 MG TABS tablet TAKE ONE TABLET BY MOUTH DAILY WITH SUPPER 30 tablet 11   No current facility-administered medications on file prior to visit.     Allergies  Allergen Reactions  . Vicodin [Hydrocodone-Acetaminophen] Nausea Only    Family History  Problem Relation Age of Onset  . Heart disease Mother   . Diabetes Father   . Heart disease Paternal Grandfather   . Colon cancer Maternal Aunt   . Thyroid disease Neg Hx     BP 122/68   Pulse 69   Wt 137 lb 9.6 oz (62.4 kg)   SpO2 96%   BMI 26.00 kg/m    Review of Systems Denies fever.      Objective:   Physical Exam VS: see vs page.   GEN: no distress.   NECK: thyroid is slightly and diffusely enlarged.       Assessment & Plan:  Hyperthyroidism: due for recheck.   Patient Instructions  blood tests are requested for you today.  We'll let you know about the results. Please come back for a follow-up appointment in 4 months

## 2017-03-30 ENCOUNTER — Ambulatory Visit (HOSPITAL_COMMUNITY)
Admission: RE | Admit: 2017-03-30 | Discharge: 2017-03-30 | Disposition: A | Discharge status: Home / Self Care | Payer: Medicare Other | Source: Ambulatory Visit | Attending: Family Medicine | Admitting: Family Medicine

## 2017-03-30 DIAGNOSIS — Z1231 Encounter for screening mammogram for malignant neoplasm of breast: Secondary | ICD-10-CM

## 2017-04-04 DIAGNOSIS — M810 Age-related osteoporosis without current pathological fracture: Secondary | ICD-10-CM | POA: Diagnosis not present

## 2017-04-04 DIAGNOSIS — I4891 Unspecified atrial fibrillation: Secondary | ICD-10-CM | POA: Diagnosis not present

## 2017-04-04 DIAGNOSIS — I447 Left bundle-branch block, unspecified: Secondary | ICD-10-CM | POA: Diagnosis not present

## 2017-04-04 DIAGNOSIS — Z950 Presence of cardiac pacemaker: Secondary | ICD-10-CM | POA: Diagnosis not present

## 2017-04-04 DIAGNOSIS — G5600 Carpal tunnel syndrome, unspecified upper limb: Secondary | ICD-10-CM | POA: Diagnosis not present

## 2017-04-04 DIAGNOSIS — Z0001 Encounter for general adult medical examination with abnormal findings: Secondary | ICD-10-CM | POA: Diagnosis not present

## 2017-04-04 DIAGNOSIS — Z681 Body mass index (BMI) 19 or less, adult: Secondary | ICD-10-CM | POA: Diagnosis not present

## 2017-04-04 DIAGNOSIS — Z23 Encounter for immunization: Secondary | ICD-10-CM | POA: Diagnosis not present

## 2017-04-04 DIAGNOSIS — E782 Mixed hyperlipidemia: Secondary | ICD-10-CM | POA: Diagnosis not present

## 2017-04-04 DIAGNOSIS — I10 Essential (primary) hypertension: Secondary | ICD-10-CM | POA: Diagnosis not present

## 2017-04-05 DIAGNOSIS — Z0001 Encounter for general adult medical examination with abnormal findings: Secondary | ICD-10-CM | POA: Diagnosis not present

## 2017-04-05 DIAGNOSIS — Z1389 Encounter for screening for other disorder: Secondary | ICD-10-CM | POA: Diagnosis not present

## 2017-04-05 DIAGNOSIS — E785 Hyperlipidemia, unspecified: Secondary | ICD-10-CM | POA: Diagnosis not present

## 2017-04-05 DIAGNOSIS — Z23 Encounter for immunization: Secondary | ICD-10-CM | POA: Diagnosis not present

## 2017-04-05 DIAGNOSIS — I442 Atrioventricular block, complete: Secondary | ICD-10-CM | POA: Diagnosis not present

## 2017-04-05 DIAGNOSIS — I447 Left bundle-branch block, unspecified: Secondary | ICD-10-CM | POA: Diagnosis not present

## 2017-04-05 DIAGNOSIS — Z139 Encounter for screening, unspecified: Secondary | ICD-10-CM | POA: Diagnosis not present

## 2017-04-05 DIAGNOSIS — Z79899 Other long term (current) drug therapy: Secondary | ICD-10-CM | POA: Diagnosis not present

## 2017-04-05 DIAGNOSIS — E782 Mixed hyperlipidemia: Secondary | ICD-10-CM | POA: Diagnosis not present

## 2017-04-05 DIAGNOSIS — I480 Paroxysmal atrial fibrillation: Secondary | ICD-10-CM | POA: Diagnosis not present

## 2017-04-05 DIAGNOSIS — E559 Vitamin D deficiency, unspecified: Secondary | ICD-10-CM | POA: Diagnosis not present

## 2017-04-05 DIAGNOSIS — R7309 Other abnormal glucose: Secondary | ICD-10-CM | POA: Diagnosis not present

## 2017-04-06 LAB — COMPREHENSIVE METABOLIC PANEL
A/G RATIO: 1.6 (ref 1.2–2.2)
ALBUMIN: 4.1 g/dL (ref 3.6–4.8)
ALT: 18 IU/L (ref 0–32)
AST: 21 IU/L (ref 0–40)
Alkaline Phosphatase: 79 IU/L (ref 39–117)
BILIRUBIN TOTAL: 0.5 mg/dL (ref 0.0–1.2)
BUN / CREAT RATIO: 13 (ref 12–28)
BUN: 11 mg/dL (ref 8–27)
CALCIUM: 9.8 mg/dL (ref 8.7–10.3)
CHLORIDE: 104 mmol/L (ref 96–106)
CO2: 26 mmol/L (ref 20–29)
Creatinine, Ser: 0.88 mg/dL (ref 0.57–1.00)
GFR, EST AFRICAN AMERICAN: 79 mL/min/{1.73_m2} (ref >59)
GFR, EST NON AFRICAN AMERICAN: 68 mL/min/{1.73_m2} (ref >59)
GLOBULIN, TOTAL: 2.6 g/dL (ref 1.5–4.5)
Glucose: 103 mg/dL — ABNORMAL HIGH (ref 65–99)
POTASSIUM: 4.7 mmol/L (ref 3.5–5.2)
SODIUM: 142 mmol/L (ref 134–144)
TOTAL PROTEIN: 6.7 g/dL (ref 6.0–8.5)

## 2017-04-06 LAB — CBC
HEMATOCRIT: 41.6 % (ref 34.0–46.6)
Hemoglobin: 14.3 g/dL (ref 11.1–15.9)
MCH: 33.7 pg — AB (ref 26.6–33.0)
MCHC: 34.4 g/dL (ref 31.5–35.7)
MCV: 98 fL — AB (ref 79–97)
PLATELETS: 208 10*3/uL (ref 150–379)
RBC: 4.24 x10E6/uL (ref 3.77–5.28)
RDW: 13.3 % (ref 12.3–15.4)
WBC: 5.3 10*3/uL (ref 3.4–10.8)

## 2017-04-06 LAB — LIPID PANEL
CHOLESTEROL TOTAL: 163 mg/dL (ref 100–199)
Chol/HDL Ratio: 3 ratio (ref 0.0–4.4)
HDL: 54 mg/dL (ref >39)
LDL CALC: 74 mg/dL (ref 0–99)
Triglycerides: 175 mg/dL — ABNORMAL HIGH (ref 0–149)
VLDL CHOLESTEROL CAL: 35 mg/dL (ref 5–40)

## 2017-04-06 LAB — VITAMIN D 25 HYDROXY (VIT D DEFICIENCY, FRACTURES): Vit D, 25-Hydroxy: 38.4 ng/mL (ref 30.0–100.0)

## 2017-04-14 DIAGNOSIS — L905 Scar conditions and fibrosis of skin: Secondary | ICD-10-CM | POA: Diagnosis not present

## 2017-04-14 DIAGNOSIS — D225 Melanocytic nevi of trunk: Secondary | ICD-10-CM | POA: Diagnosis not present

## 2017-04-14 DIAGNOSIS — D224 Melanocytic nevi of scalp and neck: Secondary | ICD-10-CM | POA: Diagnosis not present

## 2017-04-14 DIAGNOSIS — L821 Other seborrheic keratosis: Secondary | ICD-10-CM | POA: Diagnosis not present

## 2017-04-14 DIAGNOSIS — B078 Other viral warts: Secondary | ICD-10-CM | POA: Diagnosis not present

## 2017-04-14 DIAGNOSIS — L814 Other melanin hyperpigmentation: Secondary | ICD-10-CM | POA: Diagnosis not present

## 2017-05-10 ENCOUNTER — Other Ambulatory Visit: Payer: Self-pay | Admitting: Cardiovascular Disease

## 2017-05-10 NOTE — Telephone Encounter (Signed)
REFILL 

## 2017-05-11 ENCOUNTER — Ambulatory Visit (INDEPENDENT_AMBULATORY_CARE_PROVIDER_SITE_OTHER): Payer: Medicare Other | Admitting: *Deleted

## 2017-05-11 DIAGNOSIS — I442 Atrioventricular block, complete: Secondary | ICD-10-CM | POA: Diagnosis not present

## 2017-05-11 NOTE — Progress Notes (Signed)
Remote pacemaker transmission.   

## 2017-05-12 LAB — CUP PACEART REMOTE DEVICE CHECK
Battery Impedance: 878 Ohm
Battery Remaining Longevity: 67 mo
Brady Statistic AP VP Percent: 10 %
Brady Statistic AS VS Percent: 0 %
Implantable Lead Implant Date: 20110208
Implantable Lead Location: 753859
Implantable Pulse Generator Implant Date: 20110208
Lead Channel Impedance Value: 435 Ohm
Lead Channel Pacing Threshold Amplitude: 0.625 V
Lead Channel Pacing Threshold Pulse Width: 0.4 ms
Lead Channel Setting Pacing Amplitude: 1.5 V
Lead Channel Setting Pacing Amplitude: 2.25 V
Lead Channel Setting Sensing Sensitivity: 2.8 mV
MDC IDC LEAD IMPLANT DT: 20110208
MDC IDC LEAD LOCATION: 753860
MDC IDC MSMT BATTERY VOLTAGE: 2.79 V
MDC IDC MSMT LEADCHNL RA PACING THRESHOLD AMPLITUDE: 0.375 V
MDC IDC MSMT LEADCHNL RA PACING THRESHOLD PULSEWIDTH: 0.4 ms
MDC IDC MSMT LEADCHNL RV IMPEDANCE VALUE: 473 Ohm
MDC IDC SESS DTM: 20181121120538
MDC IDC SET LEADCHNL RV PACING PULSEWIDTH: 0.4 ms
MDC IDC STAT BRADY AP VS PERCENT: 0 %
MDC IDC STAT BRADY AS VP PERCENT: 90 %

## 2017-05-20 ENCOUNTER — Encounter: Payer: Self-pay | Admitting: Cardiology

## 2017-05-24 ENCOUNTER — Encounter: Payer: Self-pay | Admitting: Cardiovascular Disease

## 2017-05-24 ENCOUNTER — Ambulatory Visit (INDEPENDENT_AMBULATORY_CARE_PROVIDER_SITE_OTHER): Payer: Medicare Other | Admitting: Cardiovascular Disease

## 2017-05-24 DIAGNOSIS — I10 Essential (primary) hypertension: Secondary | ICD-10-CM

## 2017-05-24 DIAGNOSIS — I480 Paroxysmal atrial fibrillation: Secondary | ICD-10-CM | POA: Diagnosis not present

## 2017-05-24 DIAGNOSIS — Z950 Presence of cardiac pacemaker: Secondary | ICD-10-CM | POA: Diagnosis not present

## 2017-05-24 DIAGNOSIS — E7800 Pure hypercholesterolemia, unspecified: Secondary | ICD-10-CM | POA: Diagnosis not present

## 2017-05-24 NOTE — Assessment & Plan Note (Signed)
History of hyperlipidemia on statin therapy with recent lipid profile performed 04/05/17 revealing LDL 74 and HDL of 54.

## 2017-05-24 NOTE — Assessment & Plan Note (Signed)
History of permanent transvenous pacemaker insertion for complete heart block followed by Dr. Sallyanne Kuster.

## 2017-05-24 NOTE — Patient Instructions (Signed)

## 2017-05-24 NOTE — Assessment & Plan Note (Signed)
History of essential hypertension blood pressure measures 140/82. She is on lisinopril and metoprolol. Continue current meds at current dosing.

## 2017-05-24 NOTE — Assessment & Plan Note (Signed)
History of a symptomatic paroxysmal A. fib demonstrated on pacemaker interpretation on Xarelto  oral anticoagulation

## 2017-05-24 NOTE — Progress Notes (Signed)
05/24/2017 [name]   1950/11/16  [id number]  Primary Physician Sylvia Sites, MD Primary Cardiologist: Sylvia Harp MD [name], [place]  HPI:  [name] is a 66 y.o.  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 Hilma Santana in [place]. I am assuming her care from Dr. Rollene Santana who was her previous cardiologist. I last saw her in the office 02/26/14. Her cardiac risk factor profile is positive for 60 pack years of tobacco abuse having quit 10 years ago as was treated hypertension and hyperlipidemia. There is no family history. She has never had a heart attack or stroke. She had a normal cath in 2005. 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 She had negative Myoview stress test 08/30/13 for scapular pain which subsequently subsided. She's had recurrent back, shoulder and chest pain the last several weeks and months. She also has had episodes of atrial tachycardia demonstrated on pacemaker interrogation as well as paroxysmal atrial fibrillation. She also was recently diagnosed with thyrotoxicosis is being treated for this. Her CHA2DsVASc2 is 3. Since I saw her year ago she remained clinically stable. Dr. Sallyanne Santana follows her pacemaker on a regular basis. She denies chest pain or shortness of breath. She is on Xarelto oral anticoagulation.     Current Meds  Medication Sig  . aspirin 81 MG tablet Take 81 mg by mouth daily.  . butalbital-acetaminophen-caffeine (FIORICET, ESGIC) 50-325-40 MG tablet Take 1 tablet by mouth 3 times/day as needed-between meals & bedtime for headache.  . Calcium Carb-Cholecalciferol (CALCIUM-VITAMIN D) 600-400 MG-UNIT TABS Take 2 tablets by mouth daily.  Marland Kitchen ibuprofen (ADVIL,MOTRIN) 200 MG tablet Take 200 mg by mouth every 6 (six) hours as needed. Just occasional  .  lisinopril-hydrochlorothiazide (PRINZIDE,ZESTORETIC) 20-12.5 MG per tablet Take 1 tablet by mouth daily.    . methimazole (TAPAZOLE) 5 MG tablet Take 1 tablet (5 mg total) by mouth 3 (three) times a week.  . metoprolol succinate (TOPROL-XL) 50 MG 24 hr tablet TAKE ONE TABLET BY MOUTH ONCE DAILY. TAKE WITH OR IMMEDIATELY FOLLOWING A MEAL  . Multiple Vitamins-Minerals (WOMENS 50+ MULTI VITAMIN/MIN PO) Take 1 tablet by mouth daily.  . naproxen sodium (ALEVE) 220 MG tablet Take 220 mg by mouth daily as needed (pain).  . Omega-3 Fatty Acids (OMEGA 3 PO) Take 1 capsule by mouth daily.  Marland Kitchen OVER THE COUNTER MEDICATION Take 1 tablet by mouth daily. Med Name: [place]   . oxymetazoline (AFRIN) 0.05 % nasal spray Place 2 sprays into the nose 2 (two) times daily as needed.   . simvastatin (ZOCOR) 40 MG tablet TAKE ONE TABLET BY MOUTH ONCE DAILY AT BEDTIME  . XARELTO 20 MG TABS tablet TAKE ONE TABLET BY MOUTH DAILY WITH SUPPER     Allergies  Allergen Reactions  . Vicodin [Hydrocodone-Acetaminophen] Nausea Only    Social History   Socioeconomic History  . Marital status: Married    Spouse name: Not on file  . Number of children: Not on file  . Years of education: Not on file  . Highest education level: Not on file  Social Needs  . Financial resource strain: Not on file  . Food insecurity - worry: Not on file  . Food insecurity - inability: Not on file  . Transportation needs - medical: Not on file  .  Transportation needs - non-medical: Not on file  Occupational History  . Not on file  Tobacco Use  . Smoking status: Former Smoker    Packs/day: 1.50    Years: 30.00    Pack years: 45.00    Types: Cigarettes  . Smokeless tobacco: Never Used  Substance and Sexual Activity  . Alcohol use: Yes    Alcohol/week: 4.2 oz    Types: 7 Glasses of wine per week  . Drug use: No  . Sexual activity: No    Birth control/protection: Post-menopausal, Surgical    Comment: Tubal lig-1st  intercourse 66 yo-Fewer than 5 partners  Other Topics Concern  . Not on file  Social History Narrative  . Not on file     Review of Systems: General: negative for chills, fever, night sweats or weight changes.  Cardiovascular: negative for chest pain, dyspnea on exertion, edema, orthopnea, palpitations, paroxysmal nocturnal dyspnea or shortness of breath Dermatological: negative for rash Respiratory: negative for cough or wheezing Urologic: negative for hematuria Abdominal: negative for nausea, vomiting, diarrhea, bright red blood per rectum, melena, or hematemesis Neurologic: negative for visual changes, syncope, or dizziness All other systems reviewed and are otherwise negative except as noted above.    Blood pressure 140/82, pulse 65, height 5\' 2"  (1.575 m), weight 138 lb (62.6 kg).  General appearance: alert and no distress Neck: no adenopathy, no carotid bruit, no JVD, supple, symmetrical, trachea midline and thyroid not enlarged, symmetric, no tenderness/mass/nodules Lungs: clear to auscultation bilaterally Heart: regular rate and rhythm, S1, S2 normal, no murmur, click, rub or gallop Extremities: extremities normal, atraumatic, no cyanosis or edema Pulses: 2+ and symmetric Skin: Skin color, texture, turgor normal. No rashes or lesions Neurologic: Alert and oriented X 3, normal strength and tone. Normal symmetric reflexes. Normal coordination and gait  EKG atrial sensed, ventricularly paced rhythm at 65. I personally reviewed this EKG.  ASSESSMENT AND PLAN:   Hypertension History of essential hypertension blood pressure measures 140/82. She is on lisinopril and metoprolol. Continue current meds at current dosing.  Elevated cholesterol History of hyperlipidemia on statin therapy with recent lipid profile performed 04/05/17 revealing LDL 74 and HDL of 54.  History of permanent cardiac pacemaker placement History of permanent transvenous pacemaker insertion for complete  heart block followed by Dr. Sallyanne Santana.  Paroxysmal atrial fibrillation (HCC) History of a symptomatic paroxysmal A. fib demonstrated on pacemaker interpretation on Xarelto  oral anticoagulation      Sylvia Harp MD [facility], [facility] 05/24/2017 11:19 AM

## 2017-05-24 NOTE — Addendum Note (Signed)
Addended by: Ulice Brilliant T on: 05/24/2017 11:22 AM   Modules accepted: Orders

## 2017-05-24 NOTE — Addendum Note (Signed)
Addended by: Ulice Brilliant T on: 05/24/2017 03:48 PM   Modules accepted: Orders

## 2017-06-30 ENCOUNTER — Ambulatory Visit (INDEPENDENT_AMBULATORY_CARE_PROVIDER_SITE_OTHER): Payer: Medicare HMO | Admitting: Endocrinology

## 2017-06-30 ENCOUNTER — Encounter: Payer: Self-pay | Admitting: Endocrinology

## 2017-06-30 VITALS — BP 126/70 | HR 88 | Wt 138.0 lb

## 2017-06-30 DIAGNOSIS — E0590 Thyrotoxicosis, unspecified without thyrotoxic crisis or storm: Secondary | ICD-10-CM

## 2017-06-30 LAB — TSH: TSH: 2.85 u[IU]/mL (ref 0.35–4.50)

## 2017-06-30 LAB — T4, FREE: Free T4: 0.79 ng/dL (ref 0.60–1.60)

## 2017-06-30 NOTE — Patient Instructions (Signed)
blood tests are requested for you today.  We'll let you know about the results. Please come back for a follow-up appointment in 4 months.     

## 2017-06-30 NOTE — Progress Notes (Signed)
Subjective:    Patient ID: [name], female    DOB: Nov 26, 1950, 67 y.o.   MRN: [id number]  HPI Pt returns for f/u of hyperthyroidism (dx'ed 2017; she has never had thyroid imaging, but Grave's Dz is suggested by physical exam; she declines RAI).  Since on tapazole, she feels well in general.   Past Medical History:  Diagnosis Date  . Arthritis   . Atrial tachycardia ([place])   . Atypical chest pain   . Bundle branch block left    Dr. Rollene Fare is cardiologist  . Elevated cholesterol   . Hypertension   . Osteopenia 2017   T score -1.5  . Pacemaker   . Paroxysmal atrial fibrillation ([facility])    rrare episodes on pacemaker interrogation  . Wears glasses     Past Surgical History:  Procedure Laterality Date  . CARDIAC CATHETERIZATION  2005   Ejection fraction is estimated at  greater than or equal to 60 %  Wall motion is normal.  . CAROTID DUPLEX BILATERAL  march 08,2005   Impression : No evidence of of signficant plaque formation or stenosis.  . CARPAL TUNNEL RELEASE  04/30/2011   Procedure: CARPAL TUNNEL RELEASE;  Surgeon: Cammie Sickle., MD;  Location: [place];  Service: Orthopedics;  Laterality: Left;  left carpal tunnel release, inject left thumb  . CARPAL TUNNEL RELEASE  04/28/2012   Procedure: CARPAL TUNNEL RELEASE;  Surgeon: Cammie Sickle., MD;  Location: [facility];  Service: Orthopedics;  Laterality: Right;  . COLONOSCOPY    . COLONOSCOPY  04/14/2012   Procedure: COLONOSCOPY;  Surgeon: Daneil Dolin, MD;  [facility];  Service: Endoscopy;  Laterality: N/A;  8:30 AM  . DORSAL COMPARTMENT RELEASE  04/30/2011   Procedure: RELEASE DORSAL COMPARTMENT (DEQUERVAIN);  Surgeon: Cammie Sickle., MD;  Location: [facility];  Service: Orthopedics;  Laterality: Left;  release 1st dorsal compartment on left  . INSERT / REPLACE / REMOVE PACEMAKER  07/29/2009   PPM-Dual Chamber Systems Fluoro Guidance Venogram  performed 10 ml 's to the left subclavian vein  . PACEMAKER INSERTION  07/2009   Dr. Rollene Fare  Pacemaker is a Adapta L implanted 07/2009  . RENAL CIRCULATION  07/ 08/ 08   normal renal duplex doppler   . TRANSTHORACIC ECHOCARDIOGRAM  10/18/2012   Atrial tracking & V paced rhythm with pacer induced LBBB. No significant intra -ventricular  dyssynchrony.  . TUBAL LIGATION  1986    Social History   Socioeconomic History  . Marital status: Married    Spouse name: Not on file  . Number of children: Not on file  . Years of education: Not on file  . Highest education level: Not on file  Social Needs  . Financial resource strain: Not on file  . Food insecurity - worry: Not on file  . Food insecurity - inability: Not on file  . Transportation needs - medical: Not on file  . Transportation needs - non-medical: Not on file  Occupational History  . Not on file  Tobacco Use  . Smoking status: Former Smoker    Packs/day: 1.50    Years: 30.00    Pack years: 45.00    Types: Cigarettes  . Smokeless tobacco: Never Used  Substance and Sexual Activity  . Alcohol use: Yes    Alcohol/week: 4.2 oz    Types: 7 Glasses of wine per week  . Drug use: No  .  Sexual activity: No    Birth control/protection: Post-menopausal, Surgical    Comment: Tubal lig-1st intercourse 67 yo-Fewer than 5 partners  Other Topics Concern  . Not on file  Social History Narrative  . Not on file    Current Outpatient Medications on File Prior to Visit  Medication Sig Dispense Refill  . aspirin 81 MG tablet Take 81 mg by mouth daily.    . butalbital-acetaminophen-caffeine (FIORICET, ESGIC) 50-325-40 MG tablet Take 1 tablet by mouth 3 times/day as needed-between meals & bedtime for headache.    . Calcium Carb-Cholecalciferol (CALCIUM-VITAMIN D) 600-400 MG-UNIT TABS Take 2 tablets by mouth daily.    Marland Kitchen ibuprofen (ADVIL,MOTRIN) 200 MG tablet Take 200 mg by mouth every 6 (six) hours as needed. Just occasional    .  lisinopril (PRINIVIL,ZESTRIL) 10 MG tablet Take 10 mg by mouth daily.    . methimazole (TAPAZOLE) 5 MG tablet Take 1 tablet (5 mg total) by mouth 3 (three) times a week. 50 tablet 3  . metoprolol succinate (TOPROL-XL) 50 MG 24 hr tablet TAKE ONE TABLET BY MOUTH ONCE DAILY. TAKE WITH OR IMMEDIATELY FOLLOWING A MEAL 90 tablet 3  . Multiple Vitamins-Minerals (WOMENS 50+ MULTI VITAMIN/MIN PO) Take 1 tablet by mouth daily.    . naproxen sodium (ALEVE) 220 MG tablet Take 220 mg by mouth daily as needed (pain).    . Omega-3 Fatty Acids (OMEGA 3 PO) Take 1 capsule by mouth daily.    Marland Kitchen OVER THE COUNTER MEDICATION Take 1 tablet by mouth daily. Med Name: [place]     . oxymetazoline (AFRIN) 0.05 % nasal spray Place 2 sprays into the nose 2 (two) times daily as needed.     . simvastatin (ZOCOR) 40 MG tablet TAKE ONE TABLET BY MOUTH ONCE DAILY AT BEDTIME 90 tablet 3  . XARELTO 20 MG TABS tablet TAKE ONE TABLET BY MOUTH DAILY WITH SUPPER 30 tablet 11   No current facility-administered medications on file prior to visit.     Allergies  Allergen Reactions  . Vicodin [Hydrocodone-Acetaminophen] Nausea Only    Family History  Problem Relation Age of Onset  . Heart disease Mother   . Diabetes Father   . Heart disease Paternal Grandfather   . Colon cancer Maternal Aunt   . Thyroid disease Neg Hx     BP 126/70 (BP Location: Left Arm, Patient Position: Sitting, Cuff Size: Normal)   Pulse 88   Wt 138 lb (62.6 kg)   SpO2 95%   BMI 25.24 kg/m    Review of Systems Denies fever.     Objective:   Physical Exam VS: see vs page.   GEN: no distress.   NECK: thyroid is slightly and diffusely enlarged.        Assessment & Plan:  Hyperthyroidism, due for recheck  Patient Instructions  blood tests are requested for you today.  We'll let you know about the results. Please come back for a follow-up appointment in 4 months.

## 2017-07-05 DIAGNOSIS — R69 Illness, unspecified: Secondary | ICD-10-CM | POA: Diagnosis not present

## 2017-08-01 LAB — CUP PACEART INCLINIC DEVICE CHECK
Date Time Interrogation Session: 20190211162036
Implantable Lead Implant Date: 20110208
Implantable Lead Implant Date: 20110208
Implantable Lead Location: 753859
Implantable Lead Model: 5076
Lead Channel Setting Pacing Amplitude: 1.5 V
Lead Channel Setting Pacing Amplitude: 2.25 V
Lead Channel Setting Pacing Pulse Width: 0.4 ms
Lead Channel Setting Sensing Sensitivity: 2.8 mV
MDC IDC LEAD LOCATION: 753860
MDC IDC PG IMPLANT DT: 20110208

## 2017-08-10 ENCOUNTER — Ambulatory Visit (INDEPENDENT_AMBULATORY_CARE_PROVIDER_SITE_OTHER): Payer: Medicare HMO | Admitting: *Deleted

## 2017-08-10 DIAGNOSIS — I442 Atrioventricular block, complete: Secondary | ICD-10-CM

## 2017-08-10 NOTE — Progress Notes (Signed)
Remote pacemaker transmission.   

## 2017-08-11 ENCOUNTER — Encounter: Payer: Self-pay | Admitting: Cardiology

## 2017-08-11 NOTE — Progress Notes (Signed)
Letter  

## 2017-08-15 LAB — CUP PACEART REMOTE DEVICE CHECK
Brady Statistic AP VP Percent: 10 %
Brady Statistic AP VS Percent: 0 %
Brady Statistic AS VP Percent: 89 %
Brady Statistic AS VS Percent: 0 %
Implantable Lead Implant Date: 20110208
Implantable Lead Model: 5076
Implantable Pulse Generator Implant Date: 20110208
Lead Channel Impedance Value: 480 Ohm
Lead Channel Impedance Value: 552 Ohm
Lead Channel Pacing Threshold Amplitude: 0.375 V
Lead Channel Pacing Threshold Amplitude: 0.75 V
Lead Channel Pacing Threshold Pulse Width: 0.4 ms
Lead Channel Setting Pacing Amplitude: 1.5 V
MDC IDC LEAD IMPLANT DT: 20110208
MDC IDC LEAD LOCATION: 753859
MDC IDC LEAD LOCATION: 753860
MDC IDC MSMT BATTERY IMPEDANCE: 954 Ohm
MDC IDC MSMT BATTERY REMAINING LONGEVITY: 66 mo
MDC IDC MSMT BATTERY VOLTAGE: 2.79 V
MDC IDC MSMT LEADCHNL RV PACING THRESHOLD PULSEWIDTH: 0.4 ms
MDC IDC SESS DTM: 20190220122053
MDC IDC SET LEADCHNL RV PACING AMPLITUDE: 2.25 V
MDC IDC SET LEADCHNL RV PACING PULSEWIDTH: 0.4 ms
MDC IDC SET LEADCHNL RV SENSING SENSITIVITY: 2.8 mV

## 2017-10-03 DIAGNOSIS — Z6826 Body mass index (BMI) 26.0-26.9, adult: Secondary | ICD-10-CM | POA: Diagnosis not present

## 2017-10-03 DIAGNOSIS — M13849 Other specified arthritis, unspecified hand: Secondary | ICD-10-CM | POA: Diagnosis not present

## 2017-10-03 DIAGNOSIS — E663 Overweight: Secondary | ICD-10-CM | POA: Diagnosis not present

## 2017-10-03 DIAGNOSIS — M2550 Pain in unspecified joint: Secondary | ICD-10-CM | POA: Diagnosis not present

## 2017-10-03 DIAGNOSIS — Z1389 Encounter for screening for other disorder: Secondary | ICD-10-CM | POA: Diagnosis not present

## 2017-10-12 ENCOUNTER — Telehealth: Payer: Self-pay

## 2017-10-12 NOTE — Telephone Encounter (Signed)
Croitoru, Dani Gobble, MD  Truitt, Dionne Bucy, CMA        Please make sure that she stopped taking her aspirin. She does not need this now that she is on Xarelto.

## 2017-10-12 NOTE — Telephone Encounter (Signed)
Left detailed message on home machine. Okay per DPR.

## 2017-11-08 ENCOUNTER — Encounter: Payer: Self-pay | Admitting: Endocrinology

## 2017-11-08 ENCOUNTER — Ambulatory Visit (INDEPENDENT_AMBULATORY_CARE_PROVIDER_SITE_OTHER): Payer: Medicare HMO | Admitting: Endocrinology

## 2017-11-08 VITALS — BP 146/82 | HR 81 | Wt 140.2 lb

## 2017-11-08 DIAGNOSIS — E0590 Thyrotoxicosis, unspecified without thyrotoxic crisis or storm: Secondary | ICD-10-CM | POA: Diagnosis not present

## 2017-11-08 LAB — T4, FREE: Free T4: 0.84 ng/dL (ref 0.60–1.60)

## 2017-11-08 LAB — TSH: TSH: 3.05 u[IU]/mL (ref 0.35–4.50)

## 2017-11-08 NOTE — Patient Instructions (Addendum)
Your blood pressure is high today.  Please see your primary care provider soon, to have it rechecked blood tests are requested for you today.  We'll let you know about the results.   If ever you have fever while taking methimazole, stop it and call us, even if the reason is obvious, because of the risk of a rare side-effect.   Please come back for a follow-up appointment in 4 months.    

## 2017-11-08 NOTE — Progress Notes (Signed)
Subjective:    Patient ID: [name], female    DOB: [birth year], 67 y.o.   MRN: [id number]  HPI Pt returns for f/u of hyperthyroidism (dx'ed 2017; she has never had thyroid imaging, but Grave's Dz is suggested by physical exam; she declines RAI).  Since on tapazole, she feels well in general.  Past Medical History:  Diagnosis Date  . Arthritis   . Atrial tachycardia ([place])   . Atypical chest pain   . Bundle branch block left    Dr. Rollene Fare is cardiologist  . Elevated cholesterol   . Hypertension   . Osteopenia 2017   T score -1.5  . Pacemaker   . Paroxysmal atrial fibrillation ([facility])    rrare episodes on pacemaker interrogation  . Wears glasses     Past Surgical History:  Procedure Laterality Date  . CARDIAC CATHETERIZATION  2005   Ejection fraction is estimated at  greater than or equal to 60 %  Wall motion is normal.  . CAROTID DUPLEX BILATERAL  march 08,2005   Impression : No evidence of of signficant plaque formation or stenosis.  . CARPAL TUNNEL RELEASE  04/30/2011   Procedure: CARPAL TUNNEL RELEASE;  Surgeon: Cammie Sickle., MD;  Location: [place];  Service: Orthopedics;  Laterality: Left;  left carpal tunnel release, inject left thumb  . CARPAL TUNNEL RELEASE  04/28/2012   Procedure: CARPAL TUNNEL RELEASE;  Surgeon: Cammie Sickle., MD;  Location: [place];  Service: Orthopedics;  Laterality: Right;  . COLONOSCOPY    . COLONOSCOPY  04/14/2012   Procedure: COLONOSCOPY;  Surgeon: Daneil Dolin, MD;  [facility];  Service: Endoscopy;  Laterality: N/A;  8:30 AM  . DORSAL COMPARTMENT RELEASE  04/30/2011   Procedure: RELEASE DORSAL COMPARTMENT (DEQUERVAIN);  Surgeon: Cammie Sickle., MD;  Location: [facility];  Service: Orthopedics;  Laterality: Left;  release 1st dorsal compartment on left  . INSERT / REPLACE / REMOVE PACEMAKER  07/29/2009   PPM-Dual Chamber Systems Fluoro Guidance Venogram  performed 10 ml 's to the left subclavian vein  . PACEMAKER INSERTION  07/2009   Dr. Rollene Fare  Pacemaker is a Adapta L implanted 07/2009  . RENAL CIRCULATION  07/ 08/ 08   normal renal duplex doppler   . TRANSTHORACIC ECHOCARDIOGRAM  10/18/2012   Atrial tracking & V paced rhythm with pacer induced LBBB. No significant intra -ventricular  dyssynchrony.  . TUBAL LIGATION  1986    Social History   Socioeconomic History  . Marital status: Married    Spouse name: Not on file  . Number of children: Not on file  . Years of education: Not on file  . Highest education level: Not on file  Occupational History  . Not on file  Social Needs  . Financial resource strain: Not on file  . Food insecurity:    Worry: Not on file    Inability: Not on file  . Transportation needs:    Medical: Not on file    Non-medical: Not on file  Tobacco Use  . Smoking status: Former Smoker    Packs/day: 1.50    Years: 30.00    Pack years: 45.00    Types: Cigarettes  . Smokeless tobacco: Never Used  Substance and Sexual Activity  . Alcohol use: Yes    Alcohol/week: 4.2 oz    Types: 7 Glasses of wine per week  . Drug use: No  . Sexual  activity: Never    Birth control/protection: Post-menopausal, Surgical    Comment: Tubal lig-1st intercourse 67 yo-Fewer than 5 partners  Lifestyle  . Physical activity:    Days per week: Not on file    Minutes per session: Not on file  . Stress: Not on file  Relationships  . Social connections:    Talks on phone: Not on file    Gets together: Not on file    Attends religious service: Not on file    Active member of club or organization: Not on file    Attends meetings of clubs or organizations: Not on file    Relationship status: Not on file  . Intimate partner violence:    Fear of current or ex partner: Not on file    Emotionally abused: Not on file    Physically abused: Not on file    Forced sexual activity: Not on file  Other Topics Concern  . Not on file    Social History Narrative  . Not on file    Current Outpatient Medications on File Prior to Visit  Medication Sig Dispense Refill  . acetaminophen (TYLENOL) 650 MG CR tablet Take 650 mg by mouth every 8 (eight) hours as needed for pain.    Marland Kitchen aspirin 81 MG tablet Take 81 mg by mouth daily.    . butalbital-acetaminophen-caffeine (FIORICET, ESGIC) 50-325-40 MG tablet Take 1 tablet by mouth 3 times/day as needed-between meals & bedtime for headache.    . Calcium Carb-Cholecalciferol (CALCIUM-VITAMIN D) 600-400 MG-UNIT TABS Take 2 tablets by mouth daily.    Marland Kitchen ibuprofen (ADVIL,MOTRIN) 200 MG tablet Take 200 mg by mouth every 6 (six) hours as needed. Just occasional    . lisinopril (PRINIVIL,ZESTRIL) 10 MG tablet Take 10 mg by mouth daily.    . methimazole (TAPAZOLE) 5 MG tablet Take 1 tablet (5 mg total) by mouth 3 (three) times a week. 50 tablet 3  . metoprolol succinate (TOPROL-XL) 50 MG 24 hr tablet TAKE ONE TABLET BY MOUTH ONCE DAILY. TAKE WITH OR IMMEDIATELY FOLLOWING A MEAL 90 tablet 3  . Multiple Vitamins-Minerals (WOMENS 50+ MULTI VITAMIN/MIN PO) Take 1 tablet by mouth daily.    . Omega-3 Fatty Acids (OMEGA 3 PO) Take 1 capsule by mouth daily.    Marland Kitchen OVER THE COUNTER MEDICATION Take 1 tablet by mouth daily. Med Name: [place]     . oxymetazoline (AFRIN) 0.05 % nasal spray Place 2 sprays into the nose 2 (two) times daily as needed.     . simvastatin (ZOCOR) 40 MG tablet TAKE ONE TABLET BY MOUTH ONCE DAILY AT BEDTIME 90 tablet 3  . XARELTO 20 MG TABS tablet TAKE ONE TABLET BY MOUTH DAILY WITH SUPPER 30 tablet 11  . naproxen sodium (ALEVE) 220 MG tablet Take 220 mg by mouth daily as needed (pain).     No current facility-administered medications on file prior to visit.     Allergies  Allergen Reactions  . Vicodin [Hydrocodone-Acetaminophen] Nausea Only    Family History  Problem Relation Age of Onset  . Heart disease Mother   . Diabetes Father   . Heart disease  Paternal Grandfather   . Colon cancer Maternal Aunt   . Thyroid disease Neg Hx     BP (!) 146/82   Pulse 81   Wt 140 lb 3.2 oz (63.6 kg)   SpO2 97%   BMI 25.64 kg/m    Review of Systems Denies fever.  Objective:   Physical Exam VS: see vs page.   GEN: no distress.   NECK: There is no palpable thyroid enlargement.  No thyroid nodule is palpable.  No palpable lymphadenopathy at the anterior neck.      Assessment & Plan:  HTN: is noted today Hyperthyroidism: recheck today  Patient Instructions  Your blood pressure is high today.  Please see your primary care provider soon, to have it rechecked blood tests are requested for you today.  We'll let you know about the results. If ever you have fever while taking methimazole, stop it and call us, even if the reason is obvious, because of the risk of a rare side-effect. Please come back for a follow-up appointment in 4 months.

## 2017-11-10 DIAGNOSIS — H5200 Hypermetropia, unspecified eye: Secondary | ICD-10-CM | POA: Diagnosis not present

## 2017-11-10 DIAGNOSIS — Z0100 Encounter for examination of eyes and vision without abnormal findings: Secondary | ICD-10-CM | POA: Diagnosis not present

## 2017-12-12 ENCOUNTER — Ambulatory Visit (INDEPENDENT_AMBULATORY_CARE_PROVIDER_SITE_OTHER): Payer: Medicare HMO | Admitting: *Deleted

## 2017-12-12 DIAGNOSIS — I442 Atrioventricular block, complete: Secondary | ICD-10-CM

## 2017-12-12 NOTE — Progress Notes (Signed)
Remote pacemaker transmission.   

## 2017-12-13 ENCOUNTER — Encounter: Payer: Self-pay | Admitting: Cardiology

## 2017-12-15 LAB — CUP PACEART REMOTE DEVICE CHECK
Battery Remaining Longevity: 60 mo
Brady Statistic AP VP Percent: 10 %
Brady Statistic AS VP Percent: 90 %
Implantable Lead Implant Date: 20110208
Implantable Lead Location: 753859
Implantable Pulse Generator Implant Date: 20110208
Lead Channel Impedance Value: 461 Ohm
Lead Channel Pacing Threshold Amplitude: 0.625 V
Lead Channel Setting Pacing Amplitude: 1.5 V
Lead Channel Setting Pacing Amplitude: 2.25 V
Lead Channel Setting Sensing Sensitivity: 2.8 mV
MDC IDC LEAD IMPLANT DT: 20110208
MDC IDC LEAD LOCATION: 753860
MDC IDC MSMT BATTERY IMPEDANCE: 1086 Ohm
MDC IDC MSMT BATTERY VOLTAGE: 2.79 V
MDC IDC MSMT LEADCHNL RA PACING THRESHOLD AMPLITUDE: 0.375 V
MDC IDC MSMT LEADCHNL RA PACING THRESHOLD PULSEWIDTH: 0.4 ms
MDC IDC MSMT LEADCHNL RV IMPEDANCE VALUE: 534 Ohm
MDC IDC MSMT LEADCHNL RV PACING THRESHOLD PULSEWIDTH: 0.4 ms
MDC IDC SESS DTM: 20190624112619
MDC IDC SET LEADCHNL RV PACING PULSEWIDTH: 0.4 ms
MDC IDC STAT BRADY AP VS PERCENT: 0 %
MDC IDC STAT BRADY AS VS PERCENT: 0 %

## 2017-12-19 ENCOUNTER — Encounter: Payer: Medicare Other | Admitting: Gynecology

## 2017-12-19 ENCOUNTER — Ambulatory Visit (INDEPENDENT_AMBULATORY_CARE_PROVIDER_SITE_OTHER): Payer: Medicare HMO | Admitting: Gynecology

## 2017-12-19 ENCOUNTER — Encounter: Payer: Self-pay | Admitting: Gynecology

## 2017-12-19 VITALS — BP 138/80 | Ht 61.0 in | Wt 140.0 lb

## 2017-12-19 DIAGNOSIS — Z01419 Encounter for gynecological examination (general) (routine) without abnormal findings: Secondary | ICD-10-CM | POA: Diagnosis not present

## 2017-12-19 DIAGNOSIS — N952 Postmenopausal atrophic vaginitis: Secondary | ICD-10-CM

## 2017-12-19 DIAGNOSIS — M8580 Other specified disorders of bone density and structure, unspecified site: Secondary | ICD-10-CM

## 2017-12-19 NOTE — Progress Notes (Signed)
    [name] Oct 29, 1950 [id number]        67 y.o.  G2P2002 for breast and pelvic exam.  Past medical history,surgical history, problem list, medications, allergies, family history and social history were all reviewed and documented as reviewed in the EPIC chart.  ROS:  Performed with pertinent positives and negatives included in the history, assessment and plan.   Additional significant findings : None   Exam: [name] assistant Vitals:   12/19/17 1013  BP: 138/80  Weight: 140 lb (63.5 kg)  Height: 5\' 1"  (1.549 m)   Body mass index is 26.45 kg/m.  General appearance:  Normal affect, orientation and appearance. Skin: Grossly normal HEENT: Without gross lesions.  No cervical or supraclavicular adenopathy. Thyroid normal.  Lungs:  Clear without wheezing, rales or rhonchi Cardiac: RR, without RMG Abdominal:  Soft, nontender, without masses, guarding, rebound, organomegaly or hernia Breasts:  Examined lying and sitting without masses, retractions, discharge or axillary adenopathy. Pelvic:  Ext, BUS, Vagina: With atrophic changes  Cervix: With atrophic changes  Uterus: Anteverted, normal size, shape and contour, midline and mobile nontender   Adnexa: Without masses or tenderness    Anus and perineum: Normal   Rectovaginal: Normal sphincter tone without palpated masses or tenderness.    Assessment/Plan:  67 y.o. [id number] female for breast and pelvic exam.   1. Postmenopausal/atrophic genital changes.  No significant menopausal symptoms or any bleeding. 2. Osteopenia.  DEXA 2017 T score -1.5.  Prior DEXA -1.9 in 2015.  Discussed whether to repeat her DEXA this year or weight.  Given the stability will hold on DEXA now and plan in another year or 2. 3. Mammography coming due in October and I reminded her to schedule this.  Breast exam normal today. 4. Colonoscopy 2013.  Repeat at their recommended interval. 5. Pap smear 2018.  No Pap smear done today.  No history of abnormal  Pap smears.  Options to stop screening per current screening guidelines based on age reviewed.  Will readdress on annual basis. 6. Health maintenance.  No routine lab work done as patient does this elsewhere.  Follow-up 1 year, sooner as needed.   Anastasio Auerbach MD, 10:33 AM 12/19/2017

## 2017-12-19 NOTE — Patient Instructions (Signed)
Follow-up in 1 year for exam, sooner if any issues 

## 2018-01-03 DIAGNOSIS — R69 Illness, unspecified: Secondary | ICD-10-CM | POA: Diagnosis not present

## 2018-01-05 ENCOUNTER — Other Ambulatory Visit: Payer: Self-pay | Admitting: Cardiovascular Disease

## 2018-01-06 NOTE — Telephone Encounter (Signed)
xarelto

## 2018-02-16 ENCOUNTER — Other Ambulatory Visit: Payer: Self-pay | Admitting: Cardiovascular Disease

## 2018-02-16 NOTE — Telephone Encounter (Signed)
Rx sent to pharmacy   

## 2018-02-17 ENCOUNTER — Other Ambulatory Visit: Payer: Self-pay | Admitting: Gynecology

## 2018-02-17 DIAGNOSIS — Z1231 Encounter for screening mammogram for malignant neoplasm of breast: Secondary | ICD-10-CM

## 2018-02-22 ENCOUNTER — Encounter: Payer: Self-pay | Admitting: Cardiovascular Disease

## 2018-02-22 ENCOUNTER — Ambulatory Visit (INDEPENDENT_AMBULATORY_CARE_PROVIDER_SITE_OTHER): Payer: Medicare HMO | Admitting: Cardiovascular Disease

## 2018-02-22 VITALS — BP 144/68 | HR 60 | Ht 61.0 in | Wt 138.0 lb

## 2018-02-22 DIAGNOSIS — E7800 Pure hypercholesterolemia, unspecified: Secondary | ICD-10-CM

## 2018-02-22 DIAGNOSIS — R001 Bradycardia, unspecified: Secondary | ICD-10-CM | POA: Diagnosis not present

## 2018-02-22 DIAGNOSIS — I10 Essential (primary) hypertension: Secondary | ICD-10-CM | POA: Diagnosis not present

## 2018-02-22 DIAGNOSIS — I480 Paroxysmal atrial fibrillation: Secondary | ICD-10-CM | POA: Diagnosis not present

## 2018-02-22 DIAGNOSIS — Z950 Presence of cardiac pacemaker: Secondary | ICD-10-CM | POA: Diagnosis not present

## 2018-02-22 DIAGNOSIS — Z7901 Long term (current) use of anticoagulants: Secondary | ICD-10-CM

## 2018-02-22 DIAGNOSIS — I442 Atrioventricular block, complete: Secondary | ICD-10-CM

## 2018-02-22 NOTE — Patient Instructions (Addendum)
Dr Sallyanne Kuster recommends that you continue on your current medications as directed. Please refer to the Current Medication list given to you today.  Remote monitoring is used to monitor your Pacemaker or ICD from home. This monitoring reduces the number of office visits required to check your device to one time per year. It allows [place] to keep an eye on the functioning of your device to ensure it is working properly. You are scheduled for a device check from home on Monday, September 23rd, 2019. You may send your transmission at any time that day. If you have a wireless device, the transmission will be sent automatically. After your physician reviews your transmission, you will receive a notification with your next transmission date.  To improve our patient care and to more adequately follow your device, [facility] has decided, as a practice, to start following each patient four times a year with your home monitor. This means that you may experience a remote appointment that is close to an in-office appointment with your physician. Your insurance will apply at the same rate as other remote monitoring transmissions.  Dr Sallyanne Kuster recommends that you schedule a follow-up appointment in 12 months with a pacemaker check. You will receive a reminder letter in the mail two months in advance. If you don't receive a letter, please call our office to schedule the follow-up appointment.  If you need a refill on your cardiac medications before your next appointment, please call your pharmacy.    Medication samples have been provided to the patient. Drug name: Xarelto 20 mg Qty: 28 tabs LOT: [id number] Exp.Date: 4/21

## 2018-02-22 NOTE — Progress Notes (Signed)
Cardiology Office Note    Date:  02/22/2018   ID:  [name], DOB [birth year], MRN [id number]  PCP:  Sharilyn Sites, MD  Cardiologist:  Quay Burow, MD: Sanda Klein, MD   Chief Complaint  Patient presents with  . Follow-up  Pacemaker/complete heart block/atrial fibrillation  History of Present Illness:  [name] is a 67 y.o. female with complete heart block, paroxysmal atrial fibrillation, hypertension, hyperlipidemia returning for follow-up.  Her device was implanted for symptomatic bradycardia and she has a history of left bundle branch block. She subsequently has developed complete heart block and is now pacemaker dependent. Her Medtronic Adapta device was implanted in 2011.  Subsequent to the pacemaker implantation she developed episodes of infrequent paroxysmal atrial fibrillation that have been consistently asymptomatic.  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  The episodes of atrial fibrillation were not clearly associated with a thyroid disorder and have occurred even when she became euthyroid.  Based on current voltage there are roughly 5 years of estimated generator longevity remaining. There is 28% atrial pacing and 100% ventricular pacing. Since her last office pacemaker check the overall burden of atrial fibrillation has been <0.1%. In October 2017 and lasted for over 12 hours.  More recently episodes have been very brief the longest one being about 10 minutes in duration.  More commonly she has short bursts of sustained atrial tachycardia.  On the average an episode of atrial high rate occurs on a monthly basis with most of the episodes being only a few minutes long.  A single episode of nonsustained ventricular tachycardia, 11 beats has been recorded since her last device check.  Lead parameters are good.   The patient specifically denies any chest pain at rest exertion, dyspnea at rest or with exertion, orthopnea,  paroxysmal nocturnal dyspnea, syncope, palpitations, focal neurological deficits, intermittent claudication, lower extremity edema, unexplained weight gain, cough, hemoptysis or wheezing.  She is compliant with the anticoagulant and has not had any serious bleeding problems or any injuries.  She does complain about the "donut hole"  She has well-controlled hypertension and takes a statin for hyperlipidemia, with labs monitored via Dr. Hilma Favors. She had a normal nuclear perfusion stress test in May 2017  Past Medical History:  Diagnosis Date  . Arthritis   . Atrial tachycardia ([place])   . Atypical chest pain   . Bundle branch block left    Dr. Rollene Fare is cardiologist  . Elevated cholesterol   . Hypertension   . Osteopenia 2017   T score -1.5  . Pacemaker   . Paroxysmal atrial fibrillation (HCC)    rrare episodes on pacemaker interrogation  . Wears glasses     Past Surgical History:  Procedure Laterality Date  . CARDIAC CATHETERIZATION  2005   Ejection fraction is estimated at  greater than or equal to 60 %  Wall motion is normal.  . CAROTID DUPLEX BILATERAL  march 08,2005   Impression : No evidence of of signficant plaque formation or stenosis.  . CARPAL TUNNEL RELEASE  04/30/2011   Procedure: CARPAL TUNNEL RELEASE;  Surgeon: Cammie Sickle., MD;  Location: [facility];  Service: Orthopedics;  Laterality: Left;  left carpal tunnel release, inject left thumb  . CARPAL TUNNEL RELEASE  04/28/2012   Procedure: CARPAL TUNNEL RELEASE;  Surgeon: Cammie Sickle., MD;  Location: [facility];  Service: Orthopedics;  Laterality: Right;  . COLONOSCOPY    .  COLONOSCOPY  04/14/2012   Procedure: COLONOSCOPY;  Surgeon: Daneil Dolin, MD;  [facility];  Service: Endoscopy;  Laterality: N/A;  8:30 AM  . DORSAL COMPARTMENT RELEASE  04/30/2011   Procedure: RELEASE DORSAL COMPARTMENT (DEQUERVAIN);  Surgeon: Cammie Sickle., MD;  Location[facility];  Service: Orthopedics;  Laterality: Left;  release 1st dorsal compartment on left  . INSERT / REPLACE / REMOVE PACEMAKER  07/29/2009   PPM-Dual Chamber Systems Fluoro Guidance Venogram performed 10 ml 's to the left subclavian vein  . PACEMAKER INSERTION  07/2009   Dr. Rollene Fare  Pacemaker is a Adapta L implanted 07/2009  . RENAL CIRCULATION  07/ 08/ 08   normal renal duplex doppler   . TRANSTHORACIC ECHOCARDIOGRAM  10/18/2012   Atrial tracking & V paced rhythm with pacer induced LBBB. No significant intra -ventricular  dyssynchrony.  . TUBAL LIGATION  1986    Current Medications: Outpatient Medications Prior to Visit  Medication Sig Dispense Refill  . acetaminophen (TYLENOL) 650 MG CR tablet Take 650 mg by mouth every 8 (eight) hours as needed for pain.    . butalbital-acetaminophen-caffeine (FIORICET, ESGIC) 50-325-40 MG tablet Take 1 tablet by mouth 3 times/day as needed-between meals & bedtime for headache.    . Calcium Carb-Cholecalciferol (CALCIUM-VITAMIN D) 600-400 MG-UNIT TABS Take 2 tablets by mouth 2 (two) times daily.     Marland Kitchen ibuprofen (ADVIL,MOTRIN) 200 MG tablet Take 200 mg by mouth every 6 (six) hours as needed. Just occasional    . lisinopril (PRINIVIL,ZESTRIL) 10 MG tablet Take 10 mg by mouth daily.    . methimazole (TAPAZOLE) 5 MG tablet Take 1 tablet (5 mg total) by mouth 3 (three) times a week. 50 tablet 3  . metoprolol succinate (TOPROL-XL) 50 MG 24 hr tablet TAKE 1 TABLET BY MOUTH ONCE DAILY WITH  OR  IMMEDIATELY  FOLLOWING  A  MEAL 90 tablet 0  . Multiple Vitamins-Minerals (WOMENS 50+ MULTI VITAMIN/MIN PO) Take 1 tablet by mouth daily.    . Omega-3 Fatty Acids (OMEGA 3 PO) Take 1 capsule by mouth daily.    Marland Kitchen OVER THE COUNTER MEDICATION Take 1 tablet by mouth daily. Med Name: [place]     . oxymetazoline (AFRIN) 0.05 % nasal spray Place 2 sprays into the nose 2 (two) times daily as needed.     . simvastatin (ZOCOR) 40 MG tablet TAKE ONE  TABLET BY MOUTH ONCE DAILY AT BEDTIME 90 tablet 3  . XARELTO 20 MG TABS tablet TAKE 1 TABLET BY MOUTH DAILY WITH  SUPPER 30 tablet 4  . naproxen sodium (ALEVE) 220 MG tablet Take 220 mg by mouth daily as needed (pain).     No facility-administered medications prior to visit.      Allergies:   Vicodin [hydrocodone-acetaminophen]   Social History   Socioeconomic History  . Marital status: Married    Spouse name: Not on file  . Number of children: Not on file  . Years of education: Not on file  . Highest education level: Not on file  Occupational History  . Not on file  Social Needs  . Financial resource strain: Not on file  . Food insecurity:    Worry: Not on file    Inability: Not on file  . Transportation needs:    Medical: Not on file    Non-medical: Not on file  Tobacco Use  . Smoking status: Former Smoker    Packs/day:  1.50    Years: 30.00    Pack years: 45.00    Types: Cigarettes  . Smokeless tobacco: Never Used  Substance and Sexual Activity  . Alcohol use: Yes    Alcohol/week: 7.0 standard drinks    Types: 7 Glasses of wine per week  . Drug use: No  . Sexual activity: Never    Birth control/protection: Post-menopausal, Surgical    Comment: Tubal lig-1st intercourse 67 yo-Fewer than 5 partners  Lifestyle  . Physical activity:    Days per week: Not on file    Minutes per session: Not on file  . Stress: Not on file  Relationships  . Social connections:    Talks on phone: Not on file    Gets together: Not on file    Attends religious service: Not on file    Active member of club or organization: Not on file    Attends meetings of clubs or organizations: Not on file    Relationship status: Not on file  Other Topics Concern  . Not on file  Social History Narrative  . Not on file     Family History:  The patient's family history includes Colon cancer in her maternal aunt; Diabetes in her father; Heart disease in her mother and paternal grandfather.   ROS:    Please see the history of present illness.    ROS All other systems reviewed and are negative.   PHYSICAL EXAM:   VS:  BP (!) 144/68 (BP Location: Left Arm, Patient Position: Sitting, Cuff Size: Normal)   Pulse 60   Ht 5\' 1"  (1.549 m)   Wt 138 lb (62.6 kg)   BMI 26.07 kg/m     General: Alert, oriented x3, no distress, looks very comfortable.  Relatively lean and fit Head: no evidence of trauma, PERRL, EOMI, no exophtalmos or lid lag, no myxedema, no xanthelasma; normal ears, nose and oropharynx Neck: normal jugular venous pulsations and no hepatojugular reflux; brisk carotid pulses without delay and no carotid bruits Chest: clear to auscultation, no signs of consolidation by percussion or palpation, normal fremitus, symmetrical and full respiratory excursions, healthy subclavian pacemaker site Cardiovascular: normal position and quality of the apical impulse, regular rhythm, normal first and paradoxically split second heart sounds, no murmurs, rubs or gallops Abdomen: no tenderness or distention, no masses by palpation, no abnormal pulsatility or arterial bruits, normal bowel sounds, no hepatosplenomegaly Extremities: no clubbing, cyanosis or edema; 2+ radial, ulnar and brachial pulses bilaterally; 2+ right femoral, posterior tibial and dorsalis pedis pulses; 2+ left femoral, posterior tibial and dorsalis pedis pulses; no subclavian or femoral bruits Neurological: grossly nonfocal Psych: Normal mood and affect   Wt Readings from Last 3 Encounters:  02/22/18 138 lb (62.6 kg)  12/19/17 140 lb (63.5 kg)  11/08/17 140 lb 3.2 oz (63.6 kg)      Studies/Labs Reviewed:   EKG:  EKG is ordered today.  The ekg ordered today demonstrates AV sequential pacing at 60 bpm.  The QRS (paced) is 176 ms, QTC 470 ms  Recent Labs: 04/05/2017: ALT 18; BUN 11; Creatinine, Ser 0.88; Hemoglobin 14.3; Platelets 208; Potassium 4.7; Sodium 142 11/08/2017: TSH 3.05   Lipid Panel    Component Value  Date/Time   CHOL 163 04/05/2017 0820   TRIG 175 (H) 04/05/2017 0820   HDL 54 04/05/2017 0820   CHOLHDL 3.0 04/05/2017 0820   CHOLHDL 3.3 03/28/2015 0859   VLDL 27 03/28/2015 0859   LDLCALC 74 04/05/2017 0820  ASSESSMENT:    1. Pacemaker   2. CHB (complete heart block) (HCC)   3. Paroxysmal atrial fibrillation ([place])   4. Long term current use of anticoagulant   5. Essential hypertension   6. Hypercholesterolemia      PLAN:  In order of problems listed above:  1. CHB: No escape rhythm, pacemaker dependent 2. PPM: Normal device function.  Remote downloads every 3 months and yearly office visits 3. AFib: The episodes are infrequent, but are still occurring despite the fact that she is euthyroid.Marland Kitchen CHADVasc score of 3 (age, gender, HTN). 4. Xarelto: No serious bleeding complications 5. HTN: Slightly high systolic blood pressure today, usually excellent control no changes made to her medicines 6. HLP: Scheduled to have repeat labs this October with Dr. Hilma Favors.  She does not have any known coronary or peripheral vascular disorder.    Medication Adjustments/Labs and Tests Ordered: Current medicines are reviewed at length with the patient today.  Concerns regarding medicines are outlined above.  Medication changes, Labs and Tests ordered today are listed in the Patient Instructions below. Patient Instructions  Dr Sallyanne Kuster recommends that you continue on your current medications as directed. Please refer to the Current Medication list given to you today.  Remote monitoring is used to monitor your Pacemaker or ICD from home. This monitoring reduces the number of office visits required to check your device to one time per year. It allows [place] to keep an eye on the functioning of your device to ensure it is working properly. You are scheduled for a device check from home on Monday, September 23rd, 2019. You may send your transmission at any time that day. If you have a wireless device, the  transmission will be sent automatically. After your physician reviews your transmission, you will receive a notification with your next transmission date.  To improve our patient care and to more adequately follow your device, [facility] has decided, as a practice, to start following each patient four times a year with your home monitor. This means that you may experience a remote appointment that is close to an in-office appointment with your physician. Your insurance will apply at the same rate as other remote monitoring transmissions.  Dr Sallyanne Kuster recommends that you schedule a follow-up appointment in 12 months with a pacemaker check. You will receive a reminder letter in the mail two months in advance. If you don't receive a letter, please call our office to schedule the follow-up appointment.  If you need a refill on your cardiac medications before your next appointment, please call your pharmacy.    Medication samples have been provided to the patient. Drug name: Xarelto 20 mg Qty: 28 tabs LOT: [id number] Exp.Date: 4/21    SignedSanda Klein, MD  02/22/2018 3:28 PM    [address], [place], [place]  [address] Phone: [phone]; Fax: [phone]

## 2018-02-24 ENCOUNTER — Telehealth: Payer: Self-pay | Admitting: *Deleted

## 2018-02-24 NOTE — Telephone Encounter (Signed)
-----   Message from Rosalene Billings, RN sent at 02/23/2018  2:28 PM EDT ----- Regarding: FW: Appointment    ----- Message ----- From: Rivka Barbara Sent: 02/23/2018  10:29 AM EDT To: Rosalene Billings, RN Subject: Appointment                                    Good morning,  Patient is requesting to be contacted to reschedule her remote pacer check. She can be reached at [phone].

## 2018-02-24 NOTE — Telephone Encounter (Signed)
LMTCB//sss 

## 2018-02-24 NOTE — Telephone Encounter (Signed)
Patient states that remote was rescheduled yesterday to 05/25/18. Patient had no further requests at this time.

## 2018-03-14 ENCOUNTER — Ambulatory Visit: Payer: Medicare HMO | Admitting: Endocrinology

## 2018-03-14 VITALS — BP 136/82 | HR 87 | Ht 61.0 in | Wt 138.0 lb

## 2018-03-14 DIAGNOSIS — E0590 Thyrotoxicosis, unspecified without thyrotoxic crisis or storm: Secondary | ICD-10-CM

## 2018-03-14 LAB — T4, FREE: FREE T4: 0.89 ng/dL (ref 0.60–1.60)

## 2018-03-14 LAB — TSH: TSH: 3.32 u[IU]/mL (ref 0.35–4.50)

## 2018-03-14 NOTE — Progress Notes (Signed)
Subjective:    Patient ID: [name], female    DOB: 03-07-1951, 67 y.o.   MRN: [id number]  HPI Pt returns for f/u of hyperthyroidism (dx'ed 2017; she has never had thyroid imaging, but Grave's Dz is suggested by physical exam; she declines RAI).  Since on tapazole, she feels well in general.  Past Medical History:  Diagnosis Date  . Arthritis   . Atrial tachycardia ([place])   . Atypical chest pain   . Bundle branch block left    Dr. Rollene Fare is cardiologist  . Elevated cholesterol   . Hypertension   . Osteopenia 2017   T score -1.5  . Pacemaker   . Paroxysmal atrial fibrillation (HCC)    rrare episodes on pacemaker interrogation  . Wears glasses     Past Surgical History:  Procedure Laterality Date  . CARDIAC CATHETERIZATION  2005   Ejection fraction is estimated at  greater than or equal to 60 %  Wall motion is normal.  . CAROTID DUPLEX BILATERAL  march 08,2005   Impression : No evidence of of signficant plaque formation or stenosis.  . CARPAL TUNNEL RELEASE  04/30/2011   Procedure: CARPAL TUNNEL RELEASE;  Surgeon: Cammie Sickle., MD;  Location: [facility];  Service: Orthopedics;  Laterality: Left;  left carpal tunnel release, inject left thumb  . CARPAL TUNNEL RELEASE  04/28/2012   Procedure: CARPAL TUNNEL RELEASE;  Surgeon: Cammie Sickle., MD;  Location: [facility];  Service: Orthopedics;  Laterality: Right;  . COLONOSCOPY    . COLONOSCOPY  04/14/2012   Procedure: COLONOSCOPY;  Surgeon: Daneil Dolin, MD;  [facility];  Service: Endoscopy;  Laterality: N/A;  8:30 AM  . DORSAL COMPARTMENT RELEASE  04/30/2011   Procedure: RELEASE DORSAL COMPARTMENT (DEQUERVAIN);  Surgeon: Cammie Sickle., MD;  Location: [facility];  Service: Orthopedics;  Laterality: Left;  release 1st dorsal compartment on left  . INSERT / REPLACE / REMOVE PACEMAKER  07/29/2009   PPM-Dual Chamber Systems Fluoro Guidance Venogram  performed 10 ml 's to the left subclavian vein  . PACEMAKER INSERTION  07/2009   Dr. Rollene Fare  Pacemaker is a Adapta L implanted 07/2009  . RENAL CIRCULATION  07/ 08/ 08   normal renal duplex doppler   . TRANSTHORACIC ECHOCARDIOGRAM  10/18/2012   Atrial tracking & V paced rhythm with pacer induced LBBB. No significant intra -ventricular  dyssynchrony.  . TUBAL LIGATION  1986    Social History   Socioeconomic History  . Marital status: Married    Spouse name: Not on file  . Number of children: Not on file  . Years of education: Not on file  . Highest education level: Not on file  Occupational History  . Not on file  Social Needs  . Financial resource strain: Not on file  . Food insecurity:    Worry: Not on file    Inability: Not on file  . Transportation needs:    Medical: Not on file    Non-medical: Not on file  Tobacco Use  . Smoking status: Former Smoker    Packs/day: 1.50    Years: 30.00    Pack years: 45.00    Types: Cigarettes  . Smokeless tobacco: Never Used  Substance and Sexual Activity  . Alcohol use: Yes    Alcohol/week: 7.0 standard drinks    Types: 7 Glasses of wine per week  . Drug use: No  .  Sexual activity: Never    Birth control/protection: Post-menopausal, Surgical    Comment: Tubal lig-1st intercourse 67 yo-Fewer than 5 partners  Lifestyle  . Physical activity:    Days per week: Not on file    Minutes per session: Not on file  . Stress: Not on file  Relationships  . Social connections:    Talks on phone: Not on file    Gets together: Not on file    Attends religious service: Not on file    Active member of club or organization: Not on file    Attends meetings of clubs or organizations: Not on file    Relationship status: Not on file  . Intimate partner violence:    Fear of current or ex partner: Not on file    Emotionally abused: Not on file    Physically abused: Not on file    Forced sexual activity: Not on file  Other Topics Concern  .  Not on file  Social History Narrative  . Not on file    Current Outpatient Medications on File Prior to Visit  Medication Sig Dispense Refill  . acetaminophen (TYLENOL) 650 MG CR tablet Take 650 mg by mouth every 8 (eight) hours as needed for pain.    . butalbital-acetaminophen-caffeine (FIORICET, ESGIC) 50-325-40 MG tablet Take 1 tablet by mouth 3 times/day as needed-between meals & bedtime for headache.    . Calcium Carb-Cholecalciferol (CALCIUM-VITAMIN D) 600-400 MG-UNIT TABS Take 2 tablets by mouth 2 (two) times daily.     Marland Kitchen ibuprofen (ADVIL,MOTRIN) 200 MG tablet Take 200 mg by mouth every 6 (six) hours as needed. Just occasional    . lisinopril (PRINIVIL,ZESTRIL) 10 MG tablet Take 10 mg by mouth daily.    . methimazole (TAPAZOLE) 5 MG tablet Take 1 tablet (5 mg total) by mouth 3 (three) times a week. 50 tablet 3  . metoprolol succinate (TOPROL-XL) 50 MG 24 hr tablet TAKE 1 TABLET BY MOUTH ONCE DAILY WITH  OR  IMMEDIATELY  FOLLOWING  A  MEAL 90 tablet 0  . Multiple Vitamins-Minerals (WOMENS 50+ MULTI VITAMIN/MIN PO) Take 1 tablet by mouth daily.    . Omega-3 Fatty Acids (OMEGA 3 PO) Take 1 capsule by mouth daily.    Marland Kitchen OVER THE COUNTER MEDICATION Take 1 tablet by mouth daily. Med Name: Mega Red fish oil,krill oil - [facility]ARE    . oxymetazoline (AFRIN) 0.05 % nasal spray Place 2 sprays into the nose 2 (two) times daily as needed.     . simvastatin (ZOCOR) 40 MG tablet TAKE ONE TABLET BY MOUTH ONCE DAILY AT BEDTIME 90 tablet 3  . XARELTO 20 MG TABS tablet TAKE 1 TABLET BY MOUTH DAILY WITH  SUPPER 30 tablet 4   No current facility-administered medications on file prior to visit.     Allergies  Allergen Reactions  . Vicodin [Hydrocodone-Acetaminophen] Nausea Only    Family History  Problem Relation Age of Onset  . Heart disease Mother   . Diabetes Father   . Heart disease Paternal Grandfather   . Colon cancer Maternal Aunt   . Thyroid disease Neg Hx    BP 136/82 (BP Location:  Right Arm, Patient Position: Sitting)   Pulse 87   Ht 5\' 1"  (1.549 m)   Wt 138 lb (62.6 kg)   SpO2 97%   BMI 26.07 kg/m   Review of Systems Denies fever    Objective:   Physical Exam VITAL SIGNS:  See vs page GENERAL: no  distress NECK: There is no palpable thyroid enlargement.  No thyroid nodule is palpable.  No palpable lymphadenopathy at the anterior neck.       Assessment & Plan:  Hyperthyroidism: well-controlled.   AF: in this setting, she needs to maintain euthyroidism.   Patient Instructions  blood tests are requested for you today.  We'll let you know about the results. If ever you have fever while taking methimazole, stop it and call us, even if the reason is obvious, because of the risk of a rare side-effect. Please let me know if you want to do the radioactive iodine pill.  It works like this: We would first check a thyroid "scan" (a special, but easy and painless type of thyroid x ray).  you go to the x-ray department of the hospital to swallow a pill, which contains a miniscule amount of radiation.  You will not notice any symptoms from this.  You will go back to the x-ray department the next day, to lie down in front of a camera.  The results of this will be sent to me.   Based on the results, i hope to order for you a treatment pill of radioactive iodine.  Although it is a larger amount of radiation, you will again notice no symptoms from this.  The pill is gone from your body in a few days (during which you should stay away from other people), but takes several months to work.  Therefore, please return here approximately 6-8 weeks after the treatment.  This treatment has been available for many years, and the only known side-effect is an underactive thyroid.  It is possible that i would eventually prescribe for you a thyroid hormone pill, which is very inexpensive.  You don't have to worry about side-effects of this thyroid hormone pill, because it is the same molecule your  thyroid makes. Please come back for a follow-up appointment in 6 months.

## 2018-03-14 NOTE — Patient Instructions (Addendum)
blood tests are requested for you today.  We'll let you know about the results. If ever you have fever while taking methimazole, stop it and call us, even if the reason is obvious, because of the risk of a rare side-effect. Please let me know if you want to do the radioactive iodine pill.  It works like this: We would first check a thyroid "scan" (a special, but easy and painless type of thyroid x ray).  you go to the x-ray department of the hospital to swallow a pill, which contains a miniscule amount of radiation.  You will not notice any symptoms from this.  You will go back to the x-ray department the next day, to lie down in front of a camera.  The results of this will be sent to me.   Based on the results, i hope to order for you a treatment pill of radioactive iodine.  Although it is a larger amount of radiation, you will again notice no symptoms from this.  The pill is gone from your body in a few days (during which you should stay away from other people), but takes several months to work.  Therefore, please return here approximately 6-8 weeks after the treatment.  This treatment has been available for many years, and the only known side-effect is an underactive thyroid.  It is possible that i would eventually prescribe for you a thyroid hormone pill, which is very inexpensive.  You don't have to worry about side-effects of this thyroid hormone pill, because it is the same molecule your thyroid makes. Please come back for a follow-up appointment in 6 months.

## 2018-04-09 ENCOUNTER — Other Ambulatory Visit: Payer: Self-pay | Admitting: Endocrinology

## 2018-04-10 ENCOUNTER — Ambulatory Visit (HOSPITAL_COMMUNITY)
Admission: RE | Admit: 2018-04-10 | Discharge: 2018-04-10 | Disposition: A | Discharge status: Home / Self Care | Payer: Medicare HMO | Source: Ambulatory Visit | Attending: Gynecology | Admitting: Gynecology

## 2018-04-10 ENCOUNTER — Encounter (HOSPITAL_COMMUNITY): Payer: Self-pay

## 2018-04-10 DIAGNOSIS — Z1231 Encounter for screening mammogram for malignant neoplasm of breast: Secondary | ICD-10-CM | POA: Diagnosis not present

## 2018-04-11 DIAGNOSIS — Z6826 Body mass index (BMI) 26.0-26.9, adult: Secondary | ICD-10-CM | POA: Diagnosis not present

## 2018-04-11 DIAGNOSIS — Z23 Encounter for immunization: Secondary | ICD-10-CM | POA: Diagnosis not present

## 2018-04-11 DIAGNOSIS — I4891 Unspecified atrial fibrillation: Secondary | ICD-10-CM | POA: Diagnosis not present

## 2018-04-11 DIAGNOSIS — E0590 Thyrotoxicosis, unspecified without thyrotoxic crisis or storm: Secondary | ICD-10-CM | POA: Diagnosis not present

## 2018-04-11 DIAGNOSIS — E782 Mixed hyperlipidemia: Secondary | ICD-10-CM | POA: Diagnosis not present

## 2018-04-11 DIAGNOSIS — Z1389 Encounter for screening for other disorder: Secondary | ICD-10-CM | POA: Diagnosis not present

## 2018-04-11 DIAGNOSIS — Z0001 Encounter for general adult medical examination with abnormal findings: Secondary | ICD-10-CM | POA: Diagnosis not present

## 2018-04-11 DIAGNOSIS — I10 Essential (primary) hypertension: Secondary | ICD-10-CM | POA: Diagnosis not present

## 2018-04-13 DIAGNOSIS — Z23 Encounter for immunization: Secondary | ICD-10-CM | POA: Diagnosis not present

## 2018-04-13 DIAGNOSIS — E782 Mixed hyperlipidemia: Secondary | ICD-10-CM | POA: Diagnosis not present

## 2018-04-13 DIAGNOSIS — Z6826 Body mass index (BMI) 26.0-26.9, adult: Secondary | ICD-10-CM | POA: Diagnosis not present

## 2018-04-13 DIAGNOSIS — Z1389 Encounter for screening for other disorder: Secondary | ICD-10-CM | POA: Diagnosis not present

## 2018-04-13 DIAGNOSIS — Z0001 Encounter for general adult medical examination with abnormal findings: Secondary | ICD-10-CM | POA: Diagnosis not present

## 2018-04-23 IMAGING — DX DG CHEST 2V
2 series · 2 of 2 positions shown · non-contrast
Comparison: July 30, 2009

CLINICAL DATA: Chest pain.

EXAM:
CHEST  2 VIEW

[chest pa]
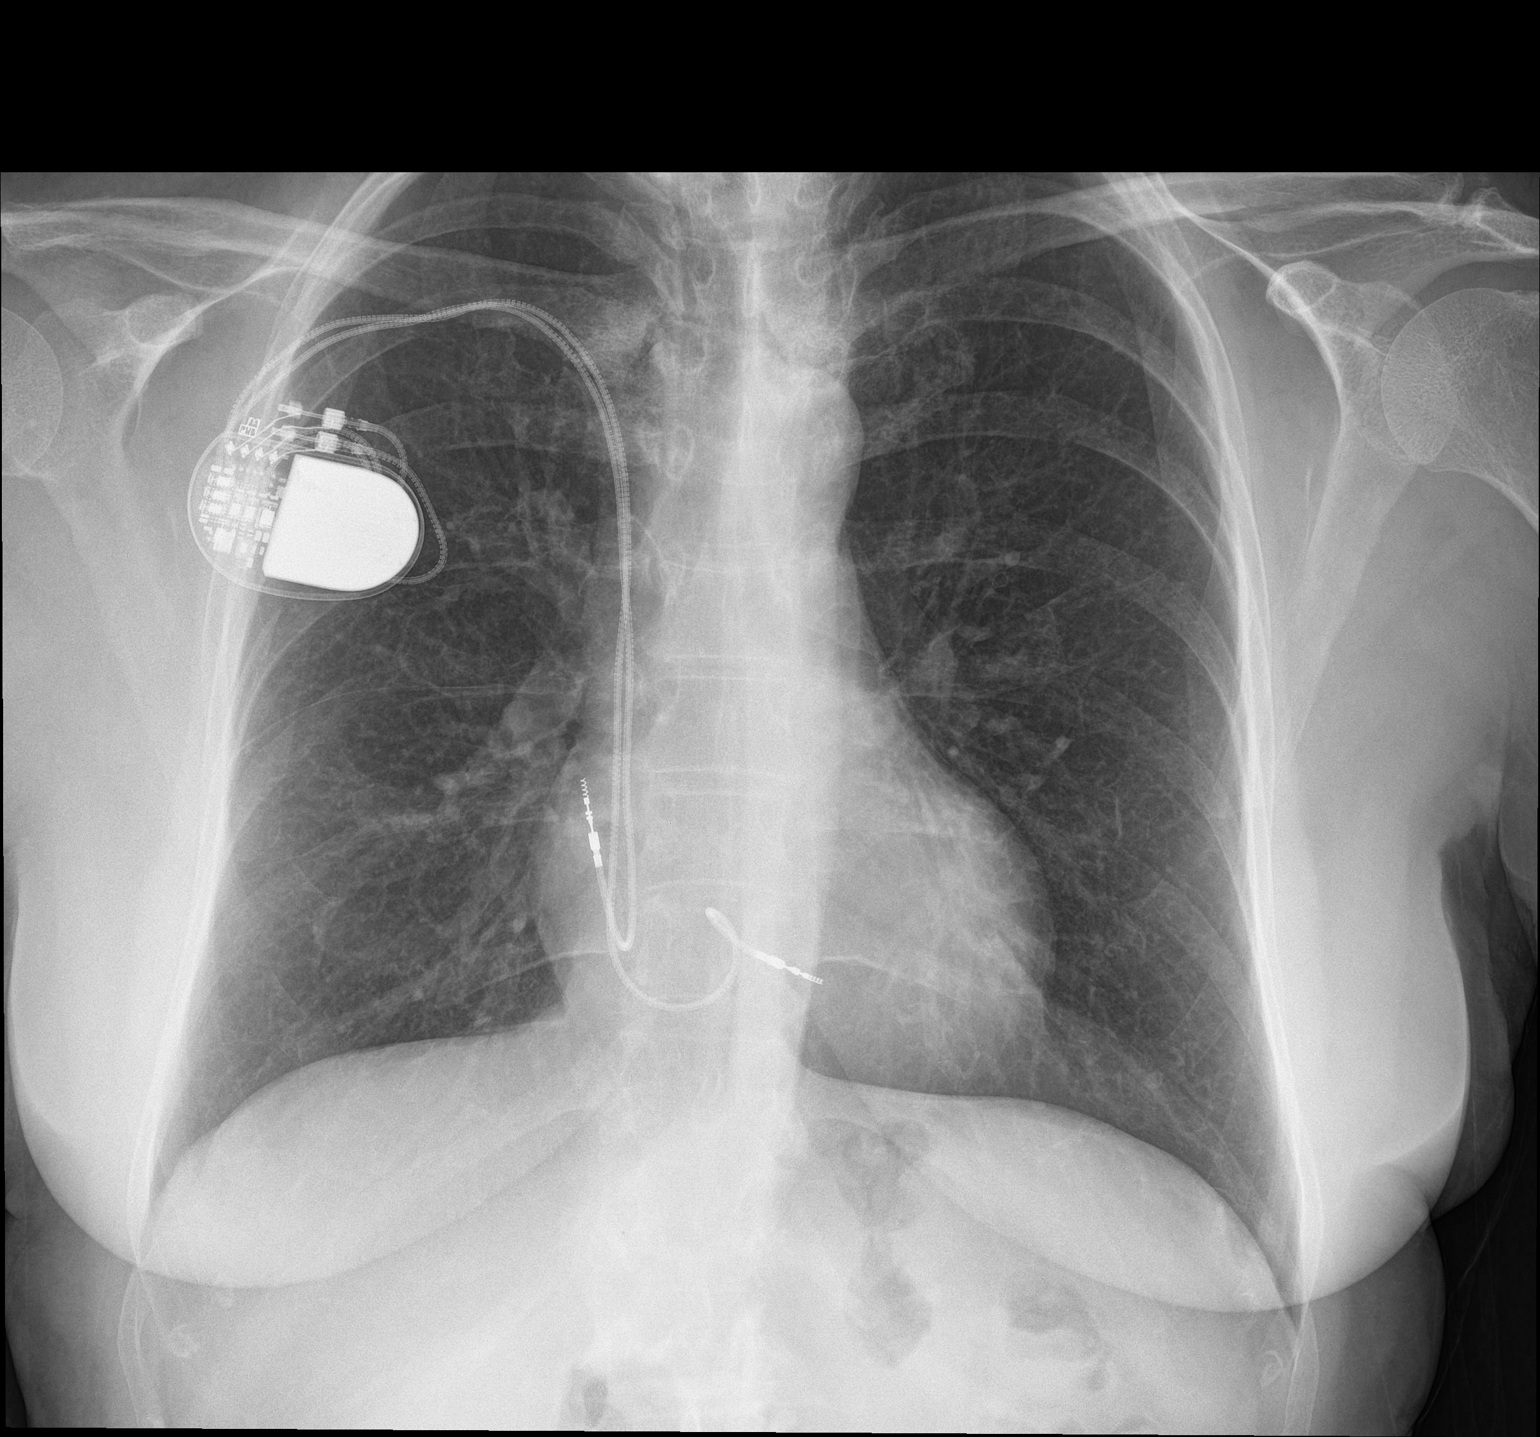

[chest lat]
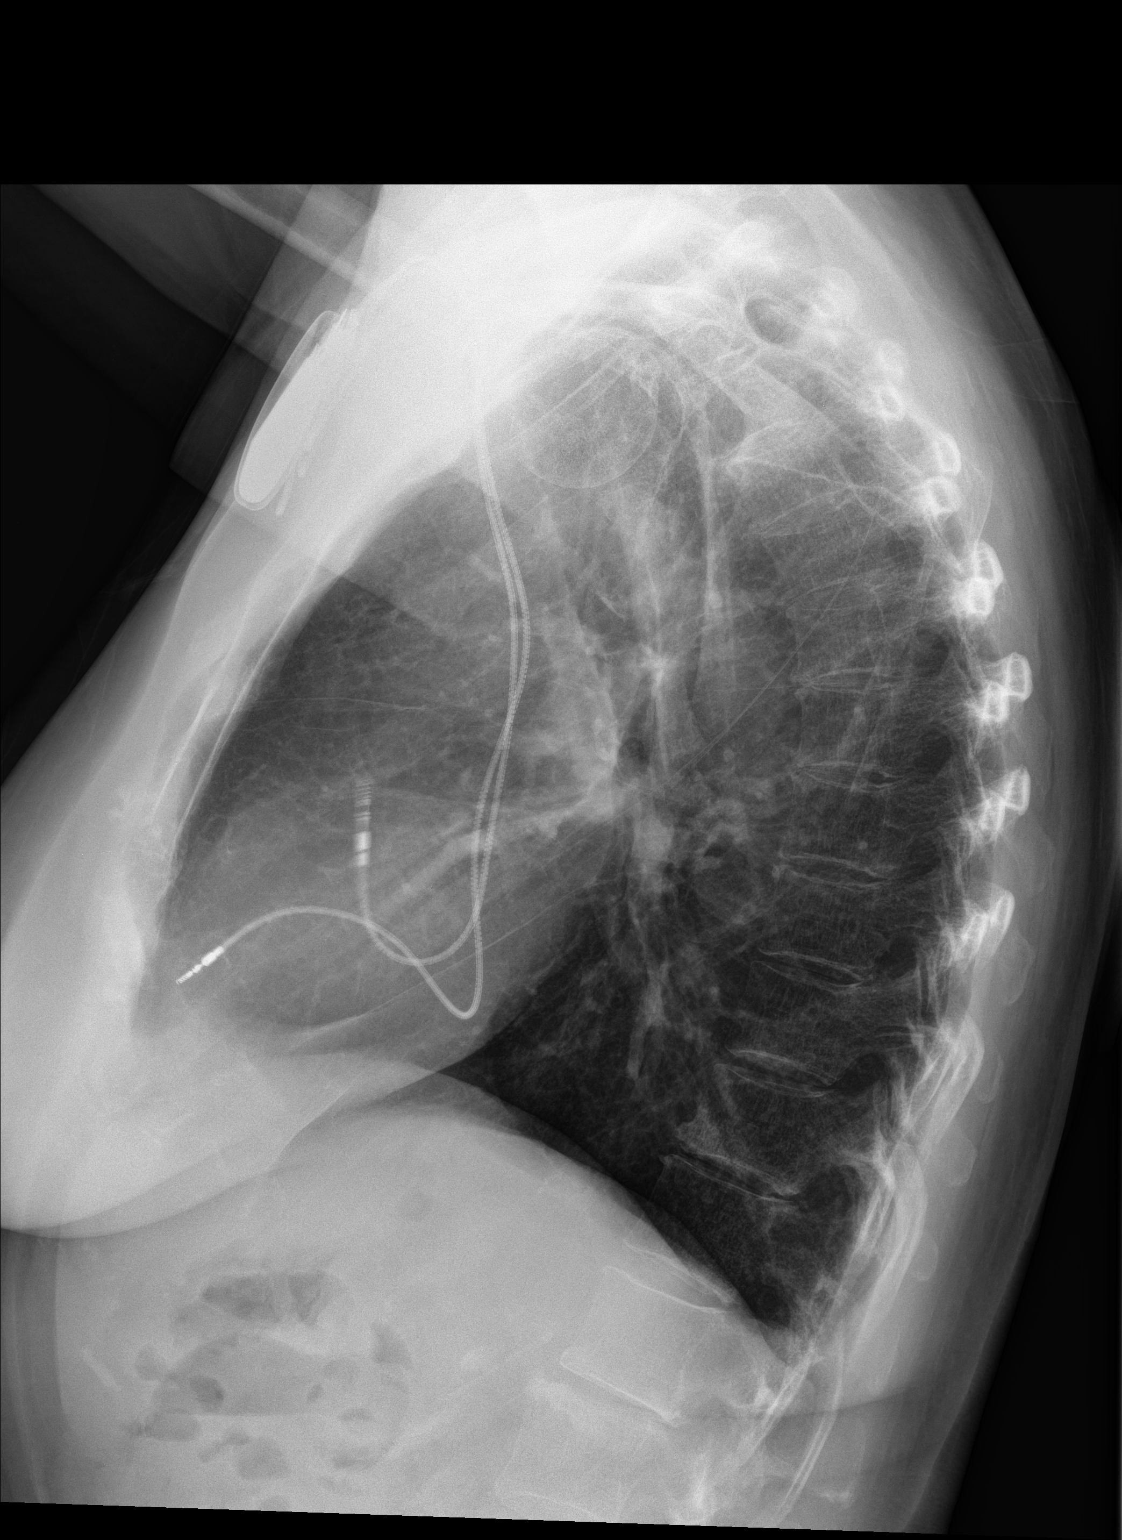

[2 of 2 positions shown; findings below may reference images not displayed]

FINDINGS: There is no edema or consolidation. The heart size and pulmonary
vascularity are normal. Pacemaker leads are attached to the right
atrium and right ventricle. No bone lesions are evident.
IMPRESSION: No edema or consolidation.

## 2018-05-05 DIAGNOSIS — D225 Melanocytic nevi of trunk: Secondary | ICD-10-CM | POA: Diagnosis not present

## 2018-05-05 DIAGNOSIS — D224 Melanocytic nevi of scalp and neck: Secondary | ICD-10-CM | POA: Diagnosis not present

## 2018-05-05 DIAGNOSIS — L905 Scar conditions and fibrosis of skin: Secondary | ICD-10-CM | POA: Diagnosis not present

## 2018-05-05 DIAGNOSIS — L821 Other seborrheic keratosis: Secondary | ICD-10-CM | POA: Diagnosis not present

## 2018-05-05 DIAGNOSIS — L814 Other melanin hyperpigmentation: Secondary | ICD-10-CM | POA: Diagnosis not present

## 2018-05-09 ENCOUNTER — Other Ambulatory Visit: Payer: Self-pay | Admitting: Cardiovascular Disease

## 2018-05-22 ENCOUNTER — Other Ambulatory Visit: Payer: Self-pay | Admitting: Cardiovascular Disease

## 2018-05-25 ENCOUNTER — Ambulatory Visit (INDEPENDENT_AMBULATORY_CARE_PROVIDER_SITE_OTHER): Payer: Medicare HMO

## 2018-05-25 DIAGNOSIS — I442 Atrioventricular block, complete: Secondary | ICD-10-CM | POA: Diagnosis not present

## 2018-05-25 NOTE — Progress Notes (Signed)
Remote pacemaker transmission.   

## 2018-05-30 ENCOUNTER — Encounter: Payer: Self-pay | Admitting: Cardiology

## 2018-06-19 DIAGNOSIS — Z6826 Body mass index (BMI) 26.0-26.9, adult: Secondary | ICD-10-CM | POA: Diagnosis not present

## 2018-06-19 DIAGNOSIS — I10 Essential (primary) hypertension: Secondary | ICD-10-CM | POA: Diagnosis not present

## 2018-06-19 DIAGNOSIS — E663 Overweight: Secondary | ICD-10-CM | POA: Diagnosis not present

## 2018-06-19 DIAGNOSIS — M1991 Primary osteoarthritis, unspecified site: Secondary | ICD-10-CM | POA: Diagnosis not present

## 2018-06-19 DIAGNOSIS — Z1389 Encounter for screening for other disorder: Secondary | ICD-10-CM | POA: Diagnosis not present

## 2018-06-19 DIAGNOSIS — I4891 Unspecified atrial fibrillation: Secondary | ICD-10-CM | POA: Diagnosis not present

## 2018-06-19 DIAGNOSIS — M810 Age-related osteoporosis without current pathological fracture: Secondary | ICD-10-CM | POA: Diagnosis not present

## 2018-06-19 DIAGNOSIS — J329 Chronic sinusitis, unspecified: Secondary | ICD-10-CM | POA: Diagnosis not present

## 2018-07-10 LAB — CUP PACEART REMOTE DEVICE CHECK
Battery Remaining Longevity: 53 mo
Battery Voltage: 2.79 V
Brady Statistic AP VP Percent: 10 %
Brady Statistic AP VS Percent: 0 %
Brady Statistic AS VP Percent: 90 %
Implantable Lead Location: 753860
Implantable Lead Model: 5076
Implantable Pulse Generator Implant Date: 20110208
Lead Channel Impedance Value: 481 Ohm
Lead Channel Pacing Threshold Amplitude: 0.375 V
Lead Channel Pacing Threshold Amplitude: 0.625 V
Lead Channel Pacing Threshold Pulse Width: 0.4 ms
Lead Channel Pacing Threshold Pulse Width: 0.4 ms
Lead Channel Setting Pacing Pulse Width: 0.4 ms
MDC IDC LEAD IMPLANT DT: 20110208
MDC IDC LEAD IMPLANT DT: 20110208
MDC IDC LEAD LOCATION: 753859
MDC IDC MSMT BATTERY IMPEDANCE: 1299 Ohm
MDC IDC MSMT LEADCHNL RV IMPEDANCE VALUE: 555 Ohm
MDC IDC SESS DTM: 20191205131518
MDC IDC SET LEADCHNL RA PACING AMPLITUDE: 1.5 V
MDC IDC SET LEADCHNL RV PACING AMPLITUDE: 2.25 V
MDC IDC SET LEADCHNL RV SENSING SENSITIVITY: 2.8 mV
MDC IDC STAT BRADY AS VS PERCENT: 0 %

## 2018-07-11 DIAGNOSIS — R69 Illness, unspecified: Secondary | ICD-10-CM | POA: Diagnosis not present

## 2018-07-26 ENCOUNTER — Other Ambulatory Visit: Payer: Self-pay | Admitting: Cardiovascular Disease

## 2018-08-01 DIAGNOSIS — R69 Illness, unspecified: Secondary | ICD-10-CM | POA: Diagnosis not present

## 2018-08-17 DIAGNOSIS — R69 Illness, unspecified: Secondary | ICD-10-CM | POA: Diagnosis not present

## 2018-08-18 ENCOUNTER — Other Ambulatory Visit: Payer: Self-pay | Admitting: Cardiovascular Disease

## 2018-08-24 ENCOUNTER — Ambulatory Visit (INDEPENDENT_AMBULATORY_CARE_PROVIDER_SITE_OTHER): Payer: Medicare HMO | Admitting: *Deleted

## 2018-08-24 DIAGNOSIS — I442 Atrioventricular block, complete: Secondary | ICD-10-CM | POA: Diagnosis not present

## 2018-08-25 ENCOUNTER — Other Ambulatory Visit: Payer: Self-pay | Admitting: Cardiovascular Disease

## 2018-08-25 LAB — CUP PACEART REMOTE DEVICE CHECK
Battery Impedance: 1382 Ohm
Battery Remaining Longevity: 50 mo
Battery Voltage: 2.78 V
Brady Statistic AP VP Percent: 10 %
Brady Statistic AP VS Percent: 0 %
Brady Statistic AS VP Percent: 90 %
Brady Statistic AS VS Percent: 0 %
Date Time Interrogation Session: 20200305125221
Implantable Lead Implant Date: 20110208
Implantable Lead Implant Date: 20110208
Implantable Lead Location: 753859
Implantable Lead Location: 753860
Implantable Lead Model: 5076
Implantable Lead Model: 5076
Implantable Pulse Generator Implant Date: 20110208
Lead Channel Impedance Value: 424 Ohm
Lead Channel Impedance Value: 539 Ohm
Lead Channel Pacing Threshold Amplitude: 0.375 V
Lead Channel Pacing Threshold Amplitude: 0.75 V
Lead Channel Pacing Threshold Pulse Width: 0.4 ms
Lead Channel Pacing Threshold Pulse Width: 0.4 ms
Lead Channel Setting Pacing Amplitude: 1.5 V
Lead Channel Setting Pacing Amplitude: 2.25 V
Lead Channel Setting Pacing Pulse Width: 0.4 ms
Lead Channel Setting Sensing Sensitivity: 2.8 mV

## 2018-08-29 ENCOUNTER — Other Ambulatory Visit: Payer: Self-pay

## 2018-08-29 MED ORDER — RIVAROXABAN 20 MG PO TABS: ORAL_TABLET | ORAL | 0 refills | Status: DC

## 2018-08-29 NOTE — Telephone Encounter (Signed)
Patient called stating that [facility] has not received her prescription for Xarelto that was sent on 08/28/2018. She would like this resent.

## 2018-08-30 DIAGNOSIS — Z23 Encounter for immunization: Secondary | ICD-10-CM | POA: Diagnosis not present

## 2018-08-31 ENCOUNTER — Other Ambulatory Visit: Payer: Self-pay

## 2018-08-31 MED ORDER — RIVAROXABAN 20 MG PO TABS: ORAL_TABLET | ORAL | 0 refills | Status: DC

## 2018-09-01 NOTE — Progress Notes (Signed)
Remote pacemaker transmission.   

## 2018-09-04 ENCOUNTER — Encounter: Payer: Self-pay | Admitting: Cardiology

## 2018-09-12 ENCOUNTER — Other Ambulatory Visit: Payer: Self-pay

## 2018-09-12 ENCOUNTER — Ambulatory Visit: Payer: Medicare HMO | Admitting: Endocrinology

## 2018-09-12 ENCOUNTER — Telehealth: Payer: Self-pay | Admitting: Endocrinology

## 2018-09-12 VITALS — BP 142/80 | HR 72 | Temp 98.4°F | Ht 61.0 in | Wt 144.0 lb

## 2018-09-12 DIAGNOSIS — E0590 Thyrotoxicosis, unspecified without thyrotoxic crisis or storm: Secondary | ICD-10-CM | POA: Diagnosis not present

## 2018-09-12 LAB — TSH: TSH: 4.18 u[IU]/mL (ref 0.35–4.50)

## 2018-09-12 NOTE — Progress Notes (Signed)
Subjective:    Patient ID: [name], female    DOB: 07/31/1950, 68 y.o.   MRN: [id number]  HPI Pt returns for f/u of hyperthyroidism (dx'ed 2017; she has never had thyroid imaging, but Grave's Dz is suggested by physical exam; she declines RAI).  Since takes tapazole, 5 mg, TIW.   Past Medical History:  Diagnosis Date  . Arthritis   . Atrial tachycardia ([place])   . Atypical chest pain   . Bundle branch block left    Dr. Rollene Fare is cardiologist  . Elevated cholesterol   . Hypertension   . Osteopenia 2017   T score -1.5  . Pacemaker   . Paroxysmal atrial fibrillation (HCC)    rrare episodes on pacemaker interrogation  . Wears glasses     Past Surgical History:  Procedure Laterality Date  . CARDIAC CATHETERIZATION  2005   Ejection fraction is estimated at  greater than or equal to 60 %  Wall motion is normal.  . CAROTID DUPLEX BILATERAL  march 08,2005   Impression : No evidence of of signficant plaque formation or stenosis.  . CARPAL TUNNEL RELEASE  04/30/2011   Procedure: CARPAL TUNNEL RELEASE;  Surgeon: Cammie Sickle., MD;  Location: [place];  Service: Orthopedics;  Laterality: Left;  left carpal tunnel release, inject left thumb  . CARPAL TUNNEL RELEASE  04/28/2012   Procedure: CARPAL TUNNEL RELEASE;  Surgeon: Cammie Sickle., MD;  Location: [place];  Service: Orthopedics;  Laterality: Right;  . COLONOSCOPY    . COLONOSCOPY  04/14/2012   Procedure: COLONOSCOPY;  Surgeon: Daneil Dolin, MD;  [facility];  Service: Endoscopy;  Laterality: N/A;  8:30 AM  . DORSAL COMPARTMENT RELEASE  04/30/2011   Procedure: RELEASE DORSAL COMPARTMENT (DEQUERVAIN);  Surgeon: Cammie Sickle., MD;  Location[facility];  Service: Orthopedics;  Laterality: Left;  release 1st dorsal compartment on left  . INSERT / REPLACE / REMOVE PACEMAKER  07/29/2009   PPM-Dual Chamber Systems Fluoro Guidance Venogram performed 10 ml 's  to the left subclavian vein  . PACEMAKER INSERTION  07/2009   Dr. Rollene Fare  Pacemaker is a Adapta L implanted 07/2009  . RENAL CIRCULATION  07/ 08/ 08   normal renal duplex doppler   . TRANSTHORACIC ECHOCARDIOGRAM  10/18/2012   Atrial tracking & V paced rhythm with pacer induced LBBB. No significant intra -ventricular  dyssynchrony.  . TUBAL LIGATION  1986    Social History   Socioeconomic History  . Marital status: Married    Spouse name: Not on file  . Number of children: Not on file  . Years of education: Not on file  . Highest education level: Not on file  Occupational History  . Not on file  Social Needs  . Financial resource strain: Not on file  . Food insecurity:    Worry: Not on file    Inability: Not on file  . Transportation needs:    Medical: Not on file    Non-medical: Not on file  Tobacco Use  . Smoking status: Former Smoker    Packs/day: 1.50    Years: 30.00    Pack years: 45.00    Types: Cigarettes  . Smokeless tobacco: Never Used  Substance and Sexual Activity  . Alcohol use: Yes    Alcohol/week: 7.0 standard drinks    Types: 7 Glasses of wine per week  . Drug use: No  . Sexual  activity: Never    Birth control/protection: Post-menopausal, Surgical    Comment: Tubal lig-1st intercourse 68 yo-Fewer than 5 partners  Lifestyle  . Physical activity:    Days per week: Not on file    Minutes per session: Not on file  . Stress: Not on file  Relationships  . Social connections:    Talks on phone: Not on file    Gets together: Not on file    Attends religious service: Not on file    Active member of club or organization: Not on file    Attends meetings of clubs or organizations: Not on file    Relationship status: Not on file  . Intimate partner violence:    Fear of current or ex partner: Not on file    Emotionally abused: Not on file    Physically abused: Not on file    Forced sexual activity: Not on file  Other Topics Concern  . Not on file   Social History Narrative  . Not on file    Current Outpatient Medications on File Prior to Visit  Medication Sig Dispense Refill  . acetaminophen (TYLENOL) 650 MG CR tablet Take 650 mg by mouth every 8 (eight) hours as needed for pain.    . butalbital-acetaminophen-caffeine (FIORICET, ESGIC) 50-325-40 MG tablet Take 1 tablet by mouth 3 times/day as needed-between meals & bedtime for headache.    . Calcium Carb-Cholecalciferol (CALCIUM-VITAMIN D) 600-400 MG-UNIT TABS Take 2 tablets by mouth 2 (two) times daily.     Marland Kitchen ibuprofen (ADVIL,MOTRIN) 200 MG tablet Take 200 mg by mouth every 6 (six) hours as needed. Just occasional    . lisinopril (PRINIVIL,ZESTRIL) 10 MG tablet Take 10 mg by mouth daily.    . methimazole (TAPAZOLE) 5 MG tablet Take 1 tablet (5 mg total) by mouth 3 (three) times a week. 50 tablet 3  . metoprolol succinate (TOPROL-XL) 50 MG 24 hr tablet TAKE 1 TABLET BY MOUTH ONCE DAILY WITH  OR  IMMEDIATELY  FOLLOWING  A  MEAL 90 tablet 1  . Multiple Vitamins-Minerals (WOMENS 50+ MULTI VITAMIN/MIN PO) Take 1 tablet by mouth daily.    . Omega-3 Fatty Acids (OMEGA 3 PO) Take 1 capsule by mouth daily.    Marland Kitchen OVER THE COUNTER MEDICATION Take 1 tablet by mouth daily. Med Name: Mega Red fish oil,krill oil - JOINT CARE    . oxymetazoline (AFRIN) 0.05 % nasal spray Place 2 sprays into the nose 2 (two) times daily as needed.     . rivaroxaban (XARELTO) 20 MG TABS tablet TAKE 1 TABLET BY MOUTH ONCE DAILY WITH SUPPER 30 tablet 0  . simvastatin (ZOCOR) 40 MG tablet TAKE 1 TABLET BY MOUTH ONCE DAILY AT BEDTIME 90 tablet 3   No current facility-administered medications on file prior to visit.     Allergies  Allergen Reactions  . Vicodin [Hydrocodone-Acetaminophen] Nausea Only    Family History  Problem Relation Age of Onset  . Heart disease Mother   . Diabetes Father   . Heart disease Paternal Grandfather   . Colon cancer Maternal Aunt   . Thyroid disease Neg Hx     BP (!) 142/80 (BP  Location: Right Arm, Patient Position: Sitting, Cuff Size: Normal)   Pulse 72   Temp 98.4 F (36.9 C)   Ht 5\' 1"  (1.549 m)   Wt 144 lb (65.3 kg)   SpO2 97%   BMI 27.21 kg/m    Review of Systems Denies fever.  Objective:   Physical Exam VITAL SIGNS:  See vs page.  GENERAL: no distress.   NECK: There is no palpable thyroid enlargement.  No thyroid nodule is palpable.  No palpable lymphadenopathy at the anterior neck.   Lab Results  Component Value Date   TSH 4.18 09/12/2018      Assessment & Plan:  Hyperthyroidism: well-controlled.  Please continue the same medication HTN: please recheck soon with PCP. Please come back for a follow-up appointment in 6 months.

## 2018-09-12 NOTE — Patient Instructions (Addendum)
Your blood pressure is high today.  Please see your primary care provider soon, to have it rechecked.   blood tests are requested for you today.  We'll let you know about the results.  If ever you have fever while taking methimazole, stop it and call us, even if the reason is obvious, because of the risk of a rare side-effect.    Please come back for a follow-up appointment in 6 months.

## 2018-09-12 NOTE — Telephone Encounter (Signed)
error 

## 2018-09-27 DIAGNOSIS — I10 Essential (primary) hypertension: Secondary | ICD-10-CM | POA: Diagnosis not present

## 2018-10-25 ENCOUNTER — Other Ambulatory Visit: Payer: Self-pay | Admitting: Pharmacist

## 2018-10-25 ENCOUNTER — Other Ambulatory Visit: Payer: Self-pay | Admitting: Cardiovascular Disease

## 2018-10-25 MED ORDER — RIVAROXABAN 20 MG PO TABS: ORAL_TABLET | ORAL | 5 refills | Status: DC

## 2018-11-23 ENCOUNTER — Ambulatory Visit (INDEPENDENT_AMBULATORY_CARE_PROVIDER_SITE_OTHER): Payer: Medicare HMO | Admitting: *Deleted

## 2018-11-23 DIAGNOSIS — I442 Atrioventricular block, complete: Secondary | ICD-10-CM

## 2018-11-23 DIAGNOSIS — I480 Paroxysmal atrial fibrillation: Secondary | ICD-10-CM

## 2018-11-24 LAB — CUP PACEART REMOTE DEVICE CHECK
Battery Impedance: 1435 Ohm
Battery Remaining Longevity: 49 mo
Battery Voltage: 2.78 V
Brady Statistic AP VP Percent: 10 %
Brady Statistic AP VS Percent: 0 %
Brady Statistic AS VP Percent: 90 %
Brady Statistic AS VS Percent: 0 %
Date Time Interrogation Session: 20200604180838
Implantable Lead Implant Date: 20110208
Implantable Lead Implant Date: 20110208
Implantable Lead Location: 753859
Implantable Lead Location: 753860
Implantable Lead Model: 5076
Implantable Lead Model: 5076
Implantable Pulse Generator Implant Date: 20110208
Lead Channel Impedance Value: 436 Ohm
Lead Channel Impedance Value: 539 Ohm
Lead Channel Pacing Threshold Amplitude: 0.375 V
Lead Channel Pacing Threshold Amplitude: 0.75 V
Lead Channel Pacing Threshold Pulse Width: 0.4 ms
Lead Channel Pacing Threshold Pulse Width: 0.4 ms
Lead Channel Setting Pacing Amplitude: 1.5 V
Lead Channel Setting Pacing Amplitude: 2.25 V
Lead Channel Setting Pacing Pulse Width: 0.4 ms
Lead Channel Setting Sensing Sensitivity: 2.8 mV

## 2018-11-30 NOTE — Progress Notes (Signed)
Remote pacemaker transmission.   

## 2018-12-21 ENCOUNTER — Other Ambulatory Visit: Payer: Self-pay

## 2018-12-21 ENCOUNTER — Encounter: Payer: Self-pay | Admitting: Gynecology

## 2018-12-21 ENCOUNTER — Ambulatory Visit (INDEPENDENT_AMBULATORY_CARE_PROVIDER_SITE_OTHER): Payer: Medicare HMO | Admitting: Gynecology

## 2018-12-21 VITALS — BP 124/76 | Ht 61.0 in | Wt 143.0 lb

## 2018-12-21 DIAGNOSIS — Z01419 Encounter for gynecological examination (general) (routine) without abnormal findings: Secondary | ICD-10-CM

## 2018-12-21 DIAGNOSIS — N952 Postmenopausal atrophic vaginitis: Secondary | ICD-10-CM

## 2018-12-21 DIAGNOSIS — M8580 Other specified disorders of bone density and structure, unspecified site: Secondary | ICD-10-CM

## 2018-12-21 NOTE — Progress Notes (Signed)
    [name] 04-03-1951 [id number]        68 y.o.  G2P2002 for breast and pelvic exam.  Without gynecologic complaints  Past medical history,surgical history, problem list, medications, allergies, family history and social history were all reviewed and documented as reviewed in the EPIC chart.  ROS:  Performed with pertinent positives and negatives included in the history, assessment and plan.   Additional significant findings : None   Exam: [name] assistant Vitals:   12/21/18 0944  BP: 124/76  Weight: 143 lb (64.9 kg)  Height: 5\' 1"  (1.549 m)   Body mass index is 27.02 kg/m.  General appearance:  Normal affect, orientation and appearance. Skin: Grossly normal HEENT: Without gross lesions.  No cervical or supraclavicular adenopathy. Thyroid normal.  Lungs:  Clear without wheezing, rales or rhonchi Cardiac: RR, without RMG Abdominal:  Soft, nontender, without masses, guarding, rebound, organomegaly or hernia Breasts:  Examined lying and sitting without masses, retractions, discharge or axillary adenopathy. Pelvic:  Ext, BUS, Vagina: With atrophic changes  Cervix: With atrophic changes  Uterus: Anteverted, normal size, shape and contour, midline and mobile nontender   Adnexa: Without masses or tenderness    Anus and perineum: Normal   Rectovaginal: Normal sphincter tone without palpated masses or tenderness.    Assessment/Plan:  67 y.o. P6P9509 female for breast and pelvic exam  1. Postmenopausal.  No significant menopausal symptoms or any vaginal bleeding. 2. Osteopenia.  DEXA 2017 T score -1.5.  Prior DEXA -1.9 in 2015.  Recommend follow-up DEXA now at 3-year interval.  Patient will schedule in follow-up for this. 3. Mammography 03/2018.  Continue with annual mammography when due.  Breast exam normal today. 4. Colonoscopy 2013.  Repeat at their recommended interval. 5. Pap smear 2018.  No Pap smear done today.  No history of abnormal Pap smears.  Options to stop  screening versus less frequent screening intervals reviewed.  Will readdress on an annual basis.  If we are to continue Pap smears and will do 1 next year at 3-year interval. 6. Health maintenance.  No routine lab work done as patient does this elsewhere.  Follow-up 1 year, sooner as needed.   Anastasio Auerbach MD, 10:22 AM 12/21/2018

## 2018-12-21 NOTE — Patient Instructions (Signed)
Follow-up for the bone density as scheduled. 

## 2019-02-08 ENCOUNTER — Other Ambulatory Visit: Payer: Self-pay

## 2019-02-08 ENCOUNTER — Ambulatory Visit (INDEPENDENT_AMBULATORY_CARE_PROVIDER_SITE_OTHER): Payer: Medicare HMO

## 2019-02-08 ENCOUNTER — Other Ambulatory Visit: Payer: Self-pay | Admitting: Gynecology

## 2019-02-08 DIAGNOSIS — Z780 Asymptomatic menopausal state: Secondary | ICD-10-CM | POA: Diagnosis not present

## 2019-02-08 DIAGNOSIS — M8589 Other specified disorders of bone density and structure, multiple sites: Secondary | ICD-10-CM

## 2019-02-08 DIAGNOSIS — M8580 Other specified disorders of bone density and structure, unspecified site: Secondary | ICD-10-CM

## 2019-02-12 ENCOUNTER — Encounter: Payer: Self-pay | Admitting: Gynecology

## 2019-02-15 ENCOUNTER — Other Ambulatory Visit: Payer: Self-pay | Admitting: Cardiovascular Disease

## 2019-02-22 ENCOUNTER — Ambulatory Visit (INDEPENDENT_AMBULATORY_CARE_PROVIDER_SITE_OTHER): Payer: Medicare HMO | Admitting: *Deleted

## 2019-02-22 DIAGNOSIS — I442 Atrioventricular block, complete: Secondary | ICD-10-CM

## 2019-02-22 LAB — CUP PACEART REMOTE DEVICE CHECK
Battery Impedance: 1550 Ohm
Battery Remaining Longevity: 46 mo
Battery Voltage: 2.78 V
Brady Statistic AP VP Percent: 10 %
Brady Statistic AP VS Percent: 0 %
Brady Statistic AS VP Percent: 90 %
Brady Statistic AS VS Percent: 0 %
Date Time Interrogation Session: 20200903112453
Implantable Lead Implant Date: 20110208
Implantable Lead Implant Date: 20110208
Implantable Lead Location: 753859
Implantable Lead Location: 753860
Implantable Lead Model: 5076
Implantable Lead Model: 5076
Implantable Pulse Generator Implant Date: 20110208
Lead Channel Impedance Value: 467 Ohm
Lead Channel Impedance Value: 546 Ohm
Lead Channel Pacing Threshold Amplitude: 0.375 V
Lead Channel Pacing Threshold Amplitude: 0.75 V
Lead Channel Pacing Threshold Pulse Width: 0.4 ms
Lead Channel Pacing Threshold Pulse Width: 0.4 ms
Lead Channel Setting Pacing Amplitude: 1.5 V
Lead Channel Setting Pacing Amplitude: 2.25 V
Lead Channel Setting Pacing Pulse Width: 0.4 ms
Lead Channel Setting Sensing Sensitivity: 2.8 mV

## 2019-03-01 ENCOUNTER — Other Ambulatory Visit (HOSPITAL_COMMUNITY): Payer: Self-pay | Admitting: Gynecology

## 2019-03-01 DIAGNOSIS — Z1231 Encounter for screening mammogram for malignant neoplasm of breast: Secondary | ICD-10-CM

## 2019-03-08 ENCOUNTER — Encounter: Payer: Self-pay | Admitting: Cardiology

## 2019-03-08 NOTE — Progress Notes (Signed)
Remote pacemaker transmission.   

## 2019-03-12 ENCOUNTER — Other Ambulatory Visit: Payer: Self-pay

## 2019-03-12 ENCOUNTER — Ambulatory Visit (INDEPENDENT_AMBULATORY_CARE_PROVIDER_SITE_OTHER): Payer: Medicare HMO | Admitting: Cardiovascular Disease

## 2019-03-12 ENCOUNTER — Encounter: Payer: Self-pay | Admitting: Cardiovascular Disease

## 2019-03-12 VITALS — BP 122/70 | HR 63 | Temp 97.7°F | Ht 61.0 in | Wt 138.4 lb

## 2019-03-12 DIAGNOSIS — I10 Essential (primary) hypertension: Secondary | ICD-10-CM

## 2019-03-12 DIAGNOSIS — Z7901 Long term (current) use of anticoagulants: Secondary | ICD-10-CM

## 2019-03-12 DIAGNOSIS — Z950 Presence of cardiac pacemaker: Secondary | ICD-10-CM

## 2019-03-12 DIAGNOSIS — E7800 Pure hypercholesterolemia, unspecified: Secondary | ICD-10-CM | POA: Diagnosis not present

## 2019-03-12 DIAGNOSIS — Z79899 Other long term (current) drug therapy: Secondary | ICD-10-CM | POA: Diagnosis not present

## 2019-03-12 DIAGNOSIS — I480 Paroxysmal atrial fibrillation: Secondary | ICD-10-CM | POA: Diagnosis not present

## 2019-03-12 DIAGNOSIS — I442 Atrioventricular block, complete: Secondary | ICD-10-CM

## 2019-03-12 MED ORDER — RIVAROXABAN 20 MG PO TABS: ORAL_TABLET | ORAL | 11 refills | Status: DC

## 2019-03-12 NOTE — Patient Instructions (Addendum)
Medication Instructions:  The current medical regimen is effective;  continue present plan and medications as directed. Please refer to the Current Medication list given to you today. If you need a refill on your cardiac medications before your next appointment, please call your pharmacy.  Labwork: Fasting lipid panel and CMP HERE IN OUR OFFICE AT [facility]    You will need to fast. DO NOT EAT OR DRINK PAST MIDNIGHT.       Take the provided lab slips with you to the lab for your blood draw.   When you have your labs (blood work) drawn today and your tests are completely normal, you will receive your results only by MyChart Message (if you have MyChart) -OR-  A paper copy in the mail.  If you have any lab test that is abnormal or we need to change your treatment, we will call you to review these results.  Follow-Up: You will need a follow up appointment in 12 months.  Please call our office 2 months in advance, July 2021 to schedule this, September 2021 appointment.  You may see Sanda Klein, MD or one of the following Advanced Practice Providers on your designated Care Team:  Almyra Deforest, [place]   Fabian Sharp, PA-C     At [facility], you and your health needs are our priority.  As part of our continuing mission to provide you with exceptional heart care, we have created designated Provider Care Teams.  These Care Teams include your primary Cardiologist (physician) and Advanced Practice Providers (APPs -  Physician Assistants and Nurse Practitioners) who all work together to provide you with the care you need, when you need it.  Thank you for choosing [facility] at [facility]!!

## 2019-03-12 NOTE — Progress Notes (Signed)
Cardiology Office Note    Date:  03/12/2019   ID:  [name], DOB [birth year], MRN [id number]  PCP:  Sharilyn Sites, MD  Cardiologist:  Quay Burow, MD: Sanda Klein, MD   Chief Complaint  Patient presents with  . Follow-up  Pacemaker/complete heart block/atrial fibrillation  History of Present Illness:  [name] is a 68 y.o. female with complete heart block, paroxysmal atrial fibrillation, hypertension, hyperlipidemia returning for follow-up.  Her device was implanted for symptomatic bradycardia and she has a history of left bundle branch block. She subsequently has developed complete heart block and is now pacemaker dependent. Her Medtronic Adapta device was implanted in 2011.  Subsequent to the pacemaker implantation she developed episodes of infrequent paroxysmal atrial fibrillation that have been consistently asymptomatic.  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  The episodes of atrial fibrillation were not clearly associated with a thyroid disorder and have occurred even when she became euthyroid.  She is done quite well since last appointment and has no cardiovascular complaints.  She notes that her balance is not as good as it has been in the past but she has not had any falls.  She sliced to her pinky finger while canning vegetables and it bled for a long time so she had to get some over-the-counter coagulation powder.  Other than that no serious bleeding events.  The patient specifically denies any chest pain at rest exertion, dyspnea at rest or with exertion, orthopnea, paroxysmal nocturnal dyspnea, syncope, palpitations, focal neurological deficits, intermittent claudication, lower extremity edema, unexplained weight gain, cough, hemoptysis or wheezing.   Based on current voltage there are 46 months of estimated generator longevity remaining. There is 90%  atrial pacing and 100% ventricular pacing. Since her last  office pacemaker check the overall burden of atrial fibrillation remains <0.1%.  Although the episodes are infrequent, they will last for several hours including a 6-hour episode in the last 3 months.  In addition she may have very brief episodes of paroxysmal atrial tachycardia.  There have been no episodes of ventricular arrhythmia since her last device check lead parameters are good.   She has well-controlled hypertension and takes a statin for hyperlipidemia, with labs monitored via Dr. Hilma Favors. She had a normal nuclear perfusion stress test in May 2017  Past Medical History:  Diagnosis Date  . Arthritis   . Atrial tachycardia ([place])   . Atypical chest pain   . Bundle branch block left    Dr. Rollene Fare is cardiologist  . Elevated cholesterol   . Hypertension   . Osteopenia 01/2019   T score -1.8  . Pacemaker   . Paroxysmal atrial fibrillation (HCC)    rrare episodes on pacemaker interrogation  . Wears glasses     Past Surgical History:  Procedure Laterality Date  . CARDIAC CATHETERIZATION  2005   Ejection fraction is estimated at  greater than or equal to 60 %  Wall motion is normal.  . CAROTID DUPLEX BILATERAL  march 08,2005   Impression : No evidence of of signficant plaque formation or stenosis.  . CARPAL TUNNEL RELEASE  04/30/2011   Procedure: CARPAL TUNNEL RELEASE;  Surgeon: Cammie Sickle., MD;  Location: [place];  Service: Orthopedics;  Laterality: Left;  left carpal tunnel release, inject left thumb  . CARPAL TUNNEL RELEASE  04/28/2012   Procedure: CARPAL TUNNEL RELEASE;  Surgeon: Cammie Sickle., MD;  Location: [facility];  Service: Orthopedics;  Laterality: Right;  . COLONOSCOPY    . COLONOSCOPY  04/14/2012   Procedure: COLONOSCOPY;  Surgeon: Daneil Dolin, MD;  [facility];  Service: Endoscopy;  Laterality: N/A;  8:30 AM  . DORSAL COMPARTMENT RELEASE  04/30/2011   Procedure: RELEASE DORSAL COMPARTMENT (DEQUERVAIN);   Surgeon: Cammie Sickle., MD;  Location: [facility];  Service: Orthopedics;  Laterality: Left;  release 1st dorsal compartment on left  . INSERT / REPLACE / REMOVE PACEMAKER  07/29/2009   PPM-Dual Chamber Systems Fluoro Guidance Venogram performed 10 ml 's to the left subclavian vein  . PACEMAKER INSERTION  07/2009   Dr. Rollene Fare  Pacemaker is a Adapta L implanted 07/2009  . RENAL CIRCULATION  07/ 08/ 08   normal renal duplex doppler   . TRANSTHORACIC ECHOCARDIOGRAM  10/18/2012   Atrial tracking & V paced rhythm with pacer induced LBBB. No significant intra -ventricular  dyssynchrony.  . TUBAL LIGATION  1986    Current Medications: Outpatient Medications Prior to Visit  Medication Sig Dispense Refill  . acetaminophen (TYLENOL) 650 MG CR tablet Take 650 mg by mouth every 8 (eight) hours as needed for pain.    . Calcium Carb-Cholecalciferol (CALCIUM-VITAMIN D) 600-400 MG-UNIT TABS Take 2 tablets by mouth 2 (two) times daily.     Marland Kitchen lisinopril-hydrochlorothiazide (ZESTORETIC) 20-12.5 MG tablet Take 1 tablet by mouth daily.    . methimazole (TAPAZOLE) 5 MG tablet Take 1 tablet (5 mg total) by mouth 3 (three) times a week. 50 tablet 3  . metoprolol succinate (TOPROL-XL) 50 MG 24 hr tablet TAKE 1 TABLET BY MOUTH ONCE DAILY WITH OR IMMEDIATELY FOLLOWING A MEAL 90 tablet 0  . Multiple Vitamins-Minerals (WOMENS 50+ MULTI VITAMIN/MIN PO) Take 1 tablet by mouth daily.    Marland Kitchen OVER THE COUNTER MEDICATION Take 1 tablet by mouth daily. Med Name: Mega Red fish oil,krill oil - JOINT CARE    . simvastatin (ZOCOR) 40 MG tablet TAKE 1 TABLET BY MOUTH ONCE DAILY AT BEDTIME 90 tablet 3  . rivaroxaban (XARELTO) 20 MG TABS tablet TAKE 1 TABLET BY MOUTH ONCE DAILY WITH SUPPER 30 tablet 5  . ibuprofen (ADVIL,MOTRIN) 200 MG tablet Take 200 mg by mouth every 6 (six) hours as needed. Just occasional    . oxymetazoline (AFRIN) 0.05 % nasal spray Place 2 sprays into the nose 2 (two) times daily as needed.       No facility-administered medications prior to visit.      Allergies:   Vicodin [hydrocodone-acetaminophen]   Social History   Socioeconomic History  . Marital status: Married    Spouse name: Not on file  . Number of children: Not on file  . Years of education: Not on file  . Highest education level: Not on file  Occupational History  . Not on file  Social Needs  . Financial resource strain: Not on file  . Food insecurity    Worry: Not on file    Inability: Not on file  . Transportation needs    Medical: Not on file    Non-medical: Not on file  Tobacco Use  . Smoking status: Former Smoker    Packs/day: 1.50    Years: 30.00    Pack years: 45.00    Types: Cigarettes  . Smokeless tobacco: Never Used  Substance and Sexual Activity  . Alcohol use: Yes    Alcohol/week: 7.0 standard drinks    Types: 7 Glasses of wine  per week  . Drug use: No  . Sexual activity: Never    Birth control/protection: Post-menopausal, Surgical    Comment: Tubal lig-1st intercourse 68 yo-Fewer than 5 partners  Lifestyle  . Physical activity    Days per week: Not on file    Minutes per session: Not on file  . Stress: Not on file  Relationships  . Social Herbalist on phone: Not on file    Gets together: Not on file    Attends religious service: Not on file    Active member of club or organization: Not on file    Attends meetings of clubs or organizations: Not on file    Relationship status: Not on file  Other Topics Concern  . Not on file  Social History Narrative  . Not on file     Family History:  The patient's family history includes Colon cancer in her maternal aunt; Diabetes in her father; Heart disease in her mother and paternal grandfather.   ROS:   Please see the history of present illness.    ROS all other systems are reviewed and are negative  PHYSICAL EXAM:   VS:  BP 122/70   Pulse 63   Temp 97.7 F (36.5 C)   Ht 5\' 1"  (1.549 m)   Wt 138 lb 6.4 oz (62.8  kg)   SpO2 97%   BMI 26.15 kg/m      General: Alert, oriented x3, no distress, appears healthy and fit.  No problems at the right subclavian pacemaker site Head: no evidence of trauma, PERRL, EOMI, no exophtalmos or lid lag, no myxedema, no xanthelasma; normal ears, nose and oropharynx Neck: normal jugular venous pulsations and no hepatojugular reflux; brisk carotid pulses without delay and no carotid bruits Chest: clear to auscultation, no signs of consolidation by percussion or palpation, normal fremitus, symmetrical and full respiratory excursions Cardiovascular: normal position and quality of the apical impulse, regular rhythm, normal first and paradoxically split second heart sounds, no murmurs, rubs or gallops Abdomen: no tenderness or distention, no masses by palpation, no abnormal pulsatility or arterial bruits, normal bowel sounds, no hepatosplenomegaly Extremities: no clubbing, cyanosis or edema; 2+ radial, ulnar and brachial pulses bilaterally; 2+ right femoral, posterior tibial and dorsalis pedis pulses; 2+ left femoral, posterior tibial and dorsalis pedis pulses; no subclavian or femoral bruits Neurological: grossly nonfocal Psych: Normal mood and affect   Wt Readings from Last 3 Encounters:  03/12/19 138 lb 6.4 oz (62.8 kg)  12/21/18 143 lb (64.9 kg)  09/12/18 144 lb (65.3 kg)      Studies/Labs Reviewed:   EKG: EKG is ordered today and shows AV sequential pacing.  QRS 156 ms, QTC 489 ms  Recent Labs: 09/12/2018: TSH 4.18   Lipid Panel    Component Value Date/Time   CHOL 163 04/05/2017 0820   TRIG 175 (H) 04/05/2017 0820   HDL 54 04/05/2017 0820   CHOLHDL 3.0 04/05/2017 0820   CHOLHDL 3.3 03/28/2015 0859   VLDL 27 03/28/2015 0859   LDLCALC 74 04/05/2017 0820     ASSESSMENT:    1. Heart block AV complete ([place])   2. Pacemaker   3. Paroxysmal atrial fibrillation (HCC)   4. Long term (current) use of anticoagulants   5. Essential hypertension   6.  Hypercholesterolemia   7. Medication management      PLAN:  In order of problems listed above:  1. CHB: There is no escape rhythm, she is pacemaker dependent.  2. PPM: Normal device function.  Continue remote downloads every 3 months and yearly visits 3. AFib: Episodes are infrequent but can last for several hours at a time.  CHADVasc score of 3 (age, gender, HTN). 4. Xarelto: No serious bleeding complications 5. HTN: Well-controlled 6. HLP: She does not have known coronary or peripheral vascular disease.  On statin.  Will check labs with her upcoming endocrinology appointment.    Medication Adjustments/Labs and Tests Ordered: Current medicines are reviewed at length with the patient today.  Concerns regarding medicines are outlined above.  Medication changes, Labs and Tests ordered today are listed in the Patient Instructions below. Patient Instructions  Medication Instructions:  The current medical regimen is effective;  continue present plan and medications as directed. Please refer to the Current Medication list given to you today. If you need a refill on your cardiac medications before your next appointment, please call your pharmacy.  Labwork: Fasting lipid panel and CMP HERE IN OUR OFFICE AT [facility]    You will need to fast. DO NOT EAT OR DRINK PAST MIDNIGHT.       Take the provided lab slips with you to the lab for your blood draw.   When you have your labs (blood work) drawn today and your tests are completely normal, you will receive your results only by MyChart Message (if you have MyChart) -OR-  A paper copy in the mail.  If you have any lab test that is abnormal or we need to change your treatment, we will call you to review these results.  Follow-Up: You will need a follow up appointment in 12 months.  Please call our office 2 months in advance, July 2021 to schedule this, September 2021 appointment.  You may see Sanda Klein, MD or one of the following Advanced  Practice Providers on your designated Care Team:  Almyra Deforest, [place]   Fabian Sharp, PA-C     At [facility], you and your health needs are our priority.  As part of our continuing mission to provide you with exceptional heart care, we have created designated Provider Care Teams.  These Care Teams include your primary Cardiologist (physician) and Advanced Practice Providers (APPs -  Physician Assistants and Nurse Practitioners) who all work together to provide you with the care you need, when you need it.  Thank you for choosing [facility] at [facility]!!        Signed, Sanda Klein, MD  03/12/2019 10:22 AM    [facility] [place], [place], [place]  [address] Phone: [phone]; Fax: [phone]

## 2019-03-14 ENCOUNTER — Other Ambulatory Visit: Payer: Self-pay

## 2019-03-14 DIAGNOSIS — R69 Illness, unspecified: Secondary | ICD-10-CM | POA: Diagnosis not present

## 2019-03-15 ENCOUNTER — Ambulatory Visit: Payer: Medicare HMO | Admitting: Endocrinology

## 2019-03-16 ENCOUNTER — Ambulatory Visit (INDEPENDENT_AMBULATORY_CARE_PROVIDER_SITE_OTHER): Payer: Medicare HMO | Admitting: Endocrinology

## 2019-03-16 ENCOUNTER — Encounter: Payer: Self-pay | Admitting: Endocrinology

## 2019-03-16 ENCOUNTER — Other Ambulatory Visit: Payer: Self-pay

## 2019-03-16 DIAGNOSIS — E0590 Thyrotoxicosis, unspecified without thyrotoxic crisis or storm: Secondary | ICD-10-CM | POA: Diagnosis not present

## 2019-03-16 LAB — TSH: TSH: 3.19 u[IU]/mL (ref 0.35–4.50)

## 2019-03-16 NOTE — Progress Notes (Signed)
Subjective:    Patient ID: [name], female    DOB: Nov 07, 1950, 68 y.o.   MRN: [id number]  HPI Pt returns for f/u of hyperthyroidism (dx'ed 2017; she has never had thyroid imaging, but Grave's Dz is suggested by physical exam; she declines RAI).  Since takes tapazole, 5 mg, TIW. She says it was normal at cardiol a few days ago.   Past Medical History:  Diagnosis Date  . Arthritis   . Atrial tachycardia ([place])   . Atypical chest pain   . Bundle branch block left    Dr. Rollene Fare is cardiologist  . Elevated cholesterol   . Hypertension   . Osteopenia 01/2019   T score -1.8  . Pacemaker   . Paroxysmal atrial fibrillation ([facility])    rrare episodes on pacemaker interrogation  . Wears glasses     Past Surgical History:  Procedure Laterality Date  . CARDIAC CATHETERIZATION  2005   Ejection fraction is estimated at  greater than or equal to 60 %  Wall motion is normal.  . CAROTID DUPLEX BILATERAL  march 08,2005   Impression : No evidence of of signficant plaque formation or stenosis.  . CARPAL TUNNEL RELEASE  04/30/2011   Procedure: CARPAL TUNNEL RELEASE;  Surgeon: Cammie Sickle., MD;  Location: [facility];  Service: Orthopedics;  Laterality: Left;  left carpal tunnel release, inject left thumb  . CARPAL TUNNEL RELEASE  04/28/2012   Procedure: CARPAL TUNNEL RELEASE;  Surgeon: Cammie Sickle., MD;  Location: [facility];  Service: Orthopedics;  Laterality: Right;  . COLONOSCOPY    . COLONOSCOPY  04/14/2012   Procedure: COLONOSCOPY;  Surgeon: Daneil Dolin, MD;  [facility];  Service: Endoscopy;  Laterality: N/A;  8:30 AM  . DORSAL COMPARTMENT RELEASE  04/30/2011   Procedure: RELEASE DORSAL COMPARTMENT (DEQUERVAIN);  Surgeon: Cammie Sickle., MD;  Location[facility];  Service: Orthopedics;  Laterality: Left;  release 1st dorsal compartment on left  . INSERT / REPLACE / REMOVE PACEMAKER  07/29/2009   PPM-Dual Chamber  Systems Fluoro Guidance Venogram performed 10 ml 's to the left subclavian vein  . PACEMAKER INSERTION  07/2009   Dr. Rollene Fare  Pacemaker is a Adapta L implanted 07/2009  . RENAL CIRCULATION  07/ 08/ 08   normal renal duplex doppler   . TRANSTHORACIC ECHOCARDIOGRAM  10/18/2012   Atrial tracking & V paced rhythm with pacer induced LBBB. No significant intra -ventricular  dyssynchrony.  . TUBAL LIGATION  1986    Social History   Socioeconomic History  . Marital status: Married    Spouse name: Not on file  . Number of children: Not on file  . Years of education: Not on file  . Highest education level: Not on file  Occupational History  . Not on file  Social Needs  . Financial resource strain: Not on file  . Food insecurity    Worry: Not on file    Inability: Not on file  . Transportation needs    Medical: Not on file    Non-medical: Not on file  Tobacco Use  . Smoking status: Former Smoker    Packs/day: 1.50    Years: 30.00    Pack years: 45.00    Types: Cigarettes  . Smokeless tobacco: Never Used  Substance and Sexual Activity  . Alcohol use: Yes    Alcohol/week: 7.0 standard drinks    Types: 7 Glasses of  wine per week  . Drug use: No  . Sexual activity: Never    Birth control/protection: Post-menopausal, Surgical    Comment: Tubal lig-1st intercourse 68 yo-Fewer than 5 partners  Lifestyle  . Physical activity    Days per week: Not on file    Minutes per session: Not on file  . Stress: Not on file  Relationships  . Social Herbalist on phone: Not on file    Gets together: Not on file    Attends religious service: Not on file    Active member of club or organization: Not on file    Attends meetings of clubs or organizations: Not on file    Relationship status: Not on file  . Intimate partner violence    Fear of current or ex partner: Not on file    Emotionally abused: Not on file    Physically abused: Not on file    Forced sexual activity: Not on  file  Other Topics Concern  . Not on file  Social History Narrative  . Not on file    Current Outpatient Medications on File Prior to Visit  Medication Sig Dispense Refill  . acetaminophen (TYLENOL) 650 MG CR tablet Take 650 mg by mouth every 8 (eight) hours as needed for pain.    . Calcium Carb-Cholecalciferol (CALCIUM-VITAMIN D) 600-400 MG-UNIT TABS Take 2 tablets by mouth 2 (two) times daily.     Marland Kitchen lisinopril-hydrochlorothiazide (ZESTORETIC) 20-12.5 MG tablet Take 1 tablet by mouth daily.    . methimazole (TAPAZOLE) 5 MG tablet Take 1 tablet (5 mg total) by mouth 3 (three) times a week. 50 tablet 3  . metoprolol succinate (TOPROL-XL) 50 MG 24 hr tablet TAKE 1 TABLET BY MOUTH ONCE DAILY WITH OR IMMEDIATELY FOLLOWING A MEAL 90 tablet 0  . Multiple Vitamins-Minerals (WOMENS 50+ MULTI VITAMIN/MIN PO) Take 1 tablet by mouth daily.    Marland Kitchen OVER THE COUNTER MEDICATION Take 1 tablet by mouth daily. Med Name: Mega Red fish oil,krill oil - JOINT CARE    . rivaroxaban (XARELTO) 20 MG TABS tablet TAKE 1 TABLET BY MOUTH ONCE DAILY WITH SUPPER 30 tablet 11  . simvastatin (ZOCOR) 40 MG tablet TAKE 1 TABLET BY MOUTH ONCE DAILY AT BEDTIME 90 tablet 3   No current facility-administered medications on file prior to visit.     Allergies  Allergen Reactions  . Vicodin [Hydrocodone-Acetaminophen] Nausea Only    Family History  Problem Relation Age of Onset  . Heart disease Mother   . Diabetes Father   . Heart disease Paternal Grandfather   . Colon cancer Maternal Aunt   . Thyroid disease Neg Hx     BP (!) 152/80 (BP Location: Left Arm, Patient Position: Sitting, Cuff Size: Normal)   Pulse 72   Ht 5\' 1"  (1.549 m)   Wt 140 lb 6.4 oz (63.7 kg)   SpO2 97%   BMI 26.53 kg/m   Review of Systems Denies fever.      Objective:   Physical Exam VITAL SIGNS:  See vs page GENERAL: no distress NECK: There is no palpable thyroid enlargement.  No thyroid nodule is palpable.  No palpable lymphadenopathy  at the anterior neck.   Lab Results  Component Value Date   TSH 3.19 03/16/2019      Assessment & Plan:  HTN: is noted today Hyperthyroidism: well-controlled.  Please continue the same medication  Patient Instructions  Your blood pressure is high today.  Please continue to  see your heart dr for this.   blood tests are requested for you today.  We'll let you know about the results.  If ever you have fever while taking methimazole, stop it and call us, even if the reason is obvious, because of the risk of a rare side-effect.    Please come back for a follow-up appointment in 6 months.

## 2019-03-16 NOTE — Patient Instructions (Addendum)
Your blood pressure is high today.  Please continue to see your heart dr for this.   blood tests are requested for you today.  We'll let you know about the results.  If ever you have fever while taking methimazole, stop it and call us, even if the reason is obvious, because of the risk of a rare side-effect.    Please come back for a follow-up appointment in 6 months.

## 2019-03-28 ENCOUNTER — Encounter: Payer: Self-pay | Admitting: Gynecology

## 2019-03-29 DIAGNOSIS — H5200 Hypermetropia, unspecified eye: Secondary | ICD-10-CM | POA: Diagnosis not present

## 2019-04-17 DIAGNOSIS — I10 Essential (primary) hypertension: Secondary | ICD-10-CM | POA: Diagnosis not present

## 2019-04-17 DIAGNOSIS — E663 Overweight: Secondary | ICD-10-CM | POA: Diagnosis not present

## 2019-04-17 DIAGNOSIS — N3281 Overactive bladder: Secondary | ICD-10-CM | POA: Diagnosis not present

## 2019-04-17 DIAGNOSIS — Z1389 Encounter for screening for other disorder: Secondary | ICD-10-CM | POA: Diagnosis not present

## 2019-04-17 DIAGNOSIS — I4891 Unspecified atrial fibrillation: Secondary | ICD-10-CM | POA: Diagnosis not present

## 2019-04-17 DIAGNOSIS — E7489 Other specified disorders of carbohydrate metabolism: Secondary | ICD-10-CM | POA: Diagnosis not present

## 2019-04-17 DIAGNOSIS — R7309 Other abnormal glucose: Secondary | ICD-10-CM | POA: Diagnosis not present

## 2019-04-17 DIAGNOSIS — M810 Age-related osteoporosis without current pathological fracture: Secondary | ICD-10-CM | POA: Diagnosis not present

## 2019-04-17 DIAGNOSIS — Z0001 Encounter for general adult medical examination with abnormal findings: Secondary | ICD-10-CM | POA: Diagnosis not present

## 2019-04-17 DIAGNOSIS — Z0000 Encounter for general adult medical examination without abnormal findings: Secondary | ICD-10-CM | POA: Diagnosis not present

## 2019-04-17 DIAGNOSIS — Z6826 Body mass index (BMI) 26.0-26.9, adult: Secondary | ICD-10-CM | POA: Diagnosis not present

## 2019-04-18 ENCOUNTER — Other Ambulatory Visit: Payer: Self-pay

## 2019-04-18 ENCOUNTER — Ambulatory Visit (HOSPITAL_COMMUNITY)
Admission: RE | Admit: 2019-04-18 | Discharge: 2019-04-18 | Disposition: A | Discharge status: Home / Self Care | Payer: Medicare HMO | Source: Ambulatory Visit | Attending: Gynecology | Admitting: Gynecology

## 2019-04-18 DIAGNOSIS — Z1231 Encounter for screening mammogram for malignant neoplasm of breast: Secondary | ICD-10-CM | POA: Diagnosis not present

## 2019-05-03 NOTE — Telephone Encounter (Signed)
Labs reviewed. All OK from a Cardiology point of view

## 2019-05-10 ENCOUNTER — Other Ambulatory Visit: Payer: Self-pay | Admitting: Cardiovascular Disease

## 2019-05-19 ENCOUNTER — Other Ambulatory Visit: Payer: Self-pay | Admitting: Cardiovascular Disease

## 2019-05-23 ENCOUNTER — Other Ambulatory Visit: Payer: Self-pay

## 2019-05-23 MED ORDER — METOPROLOL SUCCINATE ER 50 MG PO TB24: 50.0000 mg | ORAL_TABLET | Freq: Every day | ORAL | 3 refills | Status: DC

## 2019-05-24 ENCOUNTER — Other Ambulatory Visit: Payer: Self-pay | Admitting: Cardiovascular Disease

## 2019-05-24 ENCOUNTER — Ambulatory Visit (INDEPENDENT_AMBULATORY_CARE_PROVIDER_SITE_OTHER): Payer: Medicare HMO | Admitting: *Deleted

## 2019-05-24 DIAGNOSIS — I442 Atrioventricular block, complete: Secondary | ICD-10-CM

## 2019-05-24 LAB — CUP PACEART REMOTE DEVICE CHECK
Battery Impedance: 1659 Ohm
Battery Remaining Longevity: 44 mo
Battery Voltage: 2.77 V
Brady Statistic AP VP Percent: 11 %
Brady Statistic AP VS Percent: 0 %
Brady Statistic AS VP Percent: 89 %
Brady Statistic AS VS Percent: 0 %
Date Time Interrogation Session: 20201203092951
Implantable Lead Implant Date: 20110208
Implantable Lead Implant Date: 20110208
Implantable Lead Location: 753859
Implantable Lead Location: 753860
Implantable Lead Model: 5076
Implantable Lead Model: 5076
Implantable Pulse Generator Implant Date: 20110208
Lead Channel Impedance Value: 456 Ohm
Lead Channel Impedance Value: 555 Ohm
Lead Channel Pacing Threshold Amplitude: 0.375 V
Lead Channel Pacing Threshold Amplitude: 0.625 V
Lead Channel Pacing Threshold Pulse Width: 0.4 ms
Lead Channel Pacing Threshold Pulse Width: 0.4 ms
Lead Channel Setting Pacing Amplitude: 1.5 V
Lead Channel Setting Pacing Amplitude: 2.25 V
Lead Channel Setting Pacing Pulse Width: 0.4 ms
Lead Channel Setting Sensing Sensitivity: 2.8 mV

## 2019-06-05 DIAGNOSIS — D2262 Melanocytic nevi of left upper limb, including shoulder: Secondary | ICD-10-CM | POA: Diagnosis not present

## 2019-06-05 DIAGNOSIS — L821 Other seborrheic keratosis: Secondary | ICD-10-CM | POA: Diagnosis not present

## 2019-06-05 DIAGNOSIS — L814 Other melanin hyperpigmentation: Secondary | ICD-10-CM | POA: Diagnosis not present

## 2019-06-05 DIAGNOSIS — L905 Scar conditions and fibrosis of skin: Secondary | ICD-10-CM | POA: Diagnosis not present

## 2019-06-05 DIAGNOSIS — D225 Melanocytic nevi of trunk: Secondary | ICD-10-CM | POA: Diagnosis not present

## 2019-06-06 ENCOUNTER — Other Ambulatory Visit: Payer: Self-pay | Admitting: Neurosurgery

## 2019-06-06 DIAGNOSIS — M5416 Radiculopathy, lumbar region: Secondary | ICD-10-CM

## 2019-06-19 NOTE — Progress Notes (Signed)
PPM remote 

## 2019-06-27 ENCOUNTER — Other Ambulatory Visit: Payer: Self-pay

## 2019-06-27 ENCOUNTER — Ambulatory Visit: Payer: Medicare HMO | Attending: Internal Medicine

## 2019-06-27 DIAGNOSIS — Z20822 Contact with and (suspected) exposure to covid-19: Secondary | ICD-10-CM

## 2019-06-28 LAB — NOVEL CORONAVIRUS, NAA: SARS-CoV-2, NAA: NOT DETECTED

## 2019-07-10 DIAGNOSIS — R69 Illness, unspecified: Secondary | ICD-10-CM | POA: Diagnosis not present

## 2019-07-17 ENCOUNTER — Ambulatory Visit: Payer: Medicare HMO

## 2019-07-26 ENCOUNTER — Ambulatory Visit: Payer: Medicare HMO

## 2019-07-28 ENCOUNTER — Ambulatory Visit: Payer: Medicare HMO

## 2019-08-07 ENCOUNTER — Other Ambulatory Visit: Payer: Self-pay | Admitting: Cardiovascular Disease

## 2019-08-23 ENCOUNTER — Ambulatory Visit (INDEPENDENT_AMBULATORY_CARE_PROVIDER_SITE_OTHER): Payer: Medicare HMO | Admitting: *Deleted

## 2019-08-23 DIAGNOSIS — I442 Atrioventricular block, complete: Secondary | ICD-10-CM | POA: Diagnosis not present

## 2019-08-23 LAB — CUP PACEART REMOTE DEVICE CHECK
Battery Impedance: 1717 Ohm
Battery Remaining Longevity: 43 mo
Battery Voltage: 2.76 V
Brady Statistic AP VP Percent: 10 %
Brady Statistic AP VS Percent: 0 %
Brady Statistic AS VP Percent: 90 %
Brady Statistic AS VS Percent: 0 %
Date Time Interrogation Session: 20210304084005
Implantable Lead Implant Date: 20110208
Implantable Lead Implant Date: 20110208
Implantable Lead Location: 753859
Implantable Lead Location: 753860
Implantable Lead Model: 5076
Implantable Lead Model: 5076
Implantable Pulse Generator Implant Date: 20110208
Lead Channel Impedance Value: 457 Ohm
Lead Channel Impedance Value: 570 Ohm
Lead Channel Pacing Threshold Amplitude: 0.375 V
Lead Channel Pacing Threshold Amplitude: 0.75 V
Lead Channel Pacing Threshold Pulse Width: 0.4 ms
Lead Channel Pacing Threshold Pulse Width: 0.4 ms
Lead Channel Setting Pacing Amplitude: 1.5 V
Lead Channel Setting Pacing Amplitude: 2.25 V
Lead Channel Setting Pacing Pulse Width: 0.4 ms
Lead Channel Setting Sensing Sensitivity: 2.8 mV

## 2019-08-23 NOTE — Progress Notes (Signed)
PPM Remote  

## 2019-09-18 LAB — PACEMAKER DEVICE OBSERVATION

## 2019-10-03 DIAGNOSIS — E663 Overweight: Secondary | ICD-10-CM | POA: Diagnosis not present

## 2019-10-03 DIAGNOSIS — R200 Anesthesia of skin: Secondary | ICD-10-CM | POA: Diagnosis not present

## 2019-10-03 DIAGNOSIS — Z6827 Body mass index (BMI) 27.0-27.9, adult: Secondary | ICD-10-CM | POA: Diagnosis not present

## 2019-10-03 DIAGNOSIS — Z1389 Encounter for screening for other disorder: Secondary | ICD-10-CM | POA: Diagnosis not present

## 2019-10-03 DIAGNOSIS — M79671 Pain in right foot: Secondary | ICD-10-CM | POA: Diagnosis not present

## 2019-11-22 ENCOUNTER — Ambulatory Visit (INDEPENDENT_AMBULATORY_CARE_PROVIDER_SITE_OTHER): Payer: Medicare HMO | Admitting: *Deleted

## 2019-11-22 DIAGNOSIS — I447 Left bundle-branch block, unspecified: Secondary | ICD-10-CM | POA: Diagnosis not present

## 2019-11-22 LAB — CUP PACEART REMOTE DEVICE CHECK
Battery Impedance: 1866 Ohm
Battery Remaining Longevity: 39 mo
Battery Voltage: 2.77 V
Brady Statistic AP VP Percent: 10 %
Brady Statistic AP VS Percent: 0 %
Brady Statistic AS VP Percent: 90 %
Brady Statistic AS VS Percent: 0 %
Date Time Interrogation Session: 20210603074124
Implantable Lead Implant Date: 20110208
Implantable Lead Implant Date: 20110208
Implantable Lead Location: 753859
Implantable Lead Location: 753860
Implantable Lead Model: 5076
Implantable Lead Model: 5076
Implantable Pulse Generator Implant Date: 20110208
Lead Channel Impedance Value: 461 Ohm
Lead Channel Impedance Value: 549 Ohm
Lead Channel Pacing Threshold Amplitude: 0.375 V
Lead Channel Pacing Threshold Amplitude: 0.75 V
Lead Channel Pacing Threshold Pulse Width: 0.4 ms
Lead Channel Pacing Threshold Pulse Width: 0.4 ms
Lead Channel Setting Pacing Amplitude: 1.5 V
Lead Channel Setting Pacing Amplitude: 2.25 V
Lead Channel Setting Pacing Pulse Width: 0.4 ms
Lead Channel Setting Sensing Sensitivity: 2.8 mV

## 2019-11-27 NOTE — Progress Notes (Signed)
Remote pacemaker transmission.   

## 2019-12-18 ENCOUNTER — Other Ambulatory Visit: Payer: Self-pay

## 2019-12-18 ENCOUNTER — Ambulatory Visit: Payer: Medicare HMO | Admitting: Endocrinology

## 2019-12-18 ENCOUNTER — Encounter: Payer: Self-pay | Admitting: Endocrinology

## 2019-12-18 VITALS — BP 142/88 | HR 80 | Ht 61.0 in | Wt 139.0 lb

## 2019-12-18 DIAGNOSIS — E0590 Thyrotoxicosis, unspecified without thyrotoxic crisis or storm: Secondary | ICD-10-CM | POA: Diagnosis not present

## 2019-12-18 LAB — T4, FREE: Free T4: 1.18 ng/dL (ref 0.60–1.60)

## 2019-12-18 LAB — TSH: TSH: 3.32 u[IU]/mL (ref 0.35–4.50)

## 2019-12-18 NOTE — Progress Notes (Signed)
Subjective:    Patient ID: [name], female    DOB: 14-Dec-1950, 69 y.o.   MRN: [id number]  HPI Pt returns for f/u of hyperthyroidism (dx'ed 2017; she has never had thyroid imaging, but Grave's Dz is suggested by physical exam; she declines RAI).  Since takes tapazole, 5 mg, TIW.   Past Medical History:  Diagnosis Date  . Arthritis   . Atrial tachycardia ([place])   . Atypical chest pain   . Bundle branch block left    Dr. Rollene Fare is cardiologist  . Elevated cholesterol   . Hypertension   . Osteopenia 01/2019   T score -1.8  . Pacemaker   . Paroxysmal atrial fibrillation ([facility])    rrare episodes on pacemaker interrogation  . Wears glasses     Past Surgical History:  Procedure Laterality Date  . CARDIAC CATHETERIZATION  2005   Ejection fraction is estimated at  greater than or equal to 60 %  Wall motion is normal.  . CAROTID DUPLEX BILATERAL  march 08,2005   Impression : No evidence of of signficant plaque formation or stenosis.  . CARPAL TUNNEL RELEASE  04/30/2011   Procedure: CARPAL TUNNEL RELEASE;  Surgeon: Cammie Sickle., MD;  Location: [facility];  Service: Orthopedics;  Laterality: Left;  left carpal tunnel release, inject left thumb  . CARPAL TUNNEL RELEASE  04/28/2012   Procedure: CARPAL TUNNEL RELEASE;  Surgeon: Cammie Sickle., MD;  Location: [facility];  Service: Orthopedics;  Laterality: Right;  . COLONOSCOPY    . COLONOSCOPY  04/14/2012   Procedure: COLONOSCOPY;  Surgeon: Daneil Dolin, MD;  [facility];  Service: Endoscopy;  Laterality: N/A;  8:30 AM  . DORSAL COMPARTMENT RELEASE  04/30/2011   Procedure: RELEASE DORSAL COMPARTMENT (DEQUERVAIN);  Surgeon: Cammie Sickle., MD;  Location[facility];  Service: Orthopedics;  Laterality: Left;  release 1st dorsal compartment on left  . INSERT / REPLACE / REMOVE PACEMAKER  07/29/2009   PPM-Dual Chamber Systems Fluoro Guidance Venogram performed 10 ml  's to the left subclavian vein  . PACEMAKER INSERTION  07/2009   Dr. Rollene Fare  Pacemaker is a Adapta L implanted 07/2009  . RENAL CIRCULATION  07/ 08/ 08   normal renal duplex doppler   . TRANSTHORACIC ECHOCARDIOGRAM  10/18/2012   Atrial tracking & V paced rhythm with pacer induced LBBB. No significant intra -ventricular  dyssynchrony.  . TUBAL LIGATION  1986    Social History   Socioeconomic History  . Marital status: Married    Spouse name: Not on file  . Number of children: Not on file  . Years of education: Not on file  . Highest education level: Not on file  Occupational History  . Not on file  Tobacco Use  . Smoking status: Former Smoker    Packs/day: 1.50    Years: 30.00    Pack years: 45.00    Types: Cigarettes  . Smokeless tobacco: Never Used  Vaping Use  . Vaping Use: Never used  Substance and Sexual Activity  . Alcohol use: Yes    Alcohol/week: 7.0 standard drinks    Types: 7 Glasses of wine per week  . Drug use: No  . Sexual activity: Never    Birth control/protection: Post-menopausal, Surgical    Comment: Tubal lig-1st intercourse 69 yo-Fewer than 5 partners  Other Topics Concern  . Not on file  Social History Narrative  . Not on  file   Social Determinants of Health   Financial Resource Strain:   . Difficulty of Paying Living Expenses:   Food Insecurity:   . Worried About Charity fundraiser in the Last Year:   . Arboriculturist in the Last Year:   Transportation Needs:   . Film/video editor (Medical):   Marland Kitchen Lack of Transportation (Non-Medical):   Physical Activity:   . Days of Exercise per Week:   . Minutes of Exercise per Session:   Stress:   . Feeling of Stress :   Social Connections:   . Frequency of Communication with Friends and Family:   . Frequency of Social Gatherings with Friends and Family:   . Attends Religious Services:   . Active Member of Clubs or Organizations:   . Attends Archivist Meetings:   Marland Kitchen Marital Status:    Intimate Partner Violence:   . Fear of Current or Ex-Partner:   . Emotionally Abused:   Marland Kitchen Physically Abused:   . Sexually Abused:     Current Outpatient Medications on File Prior to Visit  Medication Sig Dispense Refill  . acetaminophen (TYLENOL) 650 MG CR tablet Take 650 mg by mouth every 8 (eight) hours as needed for pain.    . Calcium Carb-Cholecalciferol (CALCIUM-VITAMIN D) 600-400 MG-UNIT TABS Take 2 tablets by mouth 2 (two) times daily.     Marland Kitchen lisinopril-hydrochlorothiazide (ZESTORETIC) 20-12.5 MG tablet Take 1 tablet by mouth daily.    . methimazole (TAPAZOLE) 5 MG tablet Take 1 tablet (5 mg total) by mouth 3 (three) times a week. 50 tablet 3  . metoprolol succinate (TOPROL-XL) 50 MG 24 hr tablet Take 1 tablet (50 mg total) by mouth daily. Take with or immediately following a meal. 90 tablet 3  . Multiple Vitamins-Minerals (WOMENS 50+ MULTI VITAMIN/MIN PO) Take 1 tablet by mouth daily.    Marland Kitchen OVER THE COUNTER MEDICATION Take 1 tablet by mouth daily. Med Name: Mega Red fish oil,krill oil - JOINT CARE    . rivaroxaban (XARELTO) 20 MG TABS tablet TAKE 1 TABLET BY MOUTH ONCE DAILY WITH SUPPER 30 tablet 11  . simvastatin (ZOCOR) 40 MG tablet TAKE 1 TABLET BY MOUTH ONCE DAILY AT BEDTIME 90 tablet 1   No current facility-administered medications on file prior to visit.    Allergies  Allergen Reactions  . Vicodin [Hydrocodone-Acetaminophen] Nausea Only    Family History  Problem Relation Age of Onset  . Heart disease Mother   . Diabetes Father   . Heart disease Paternal Grandfather   . Colon cancer Maternal Aunt   . Thyroid disease Neg Hx     BP (!) 142/88   Pulse 80   Ht 5\' 1"  (1.549 m)   Wt 139 lb (63 kg)   SpO2 98%   BMI 26.26 kg/m    Review of Systems Denies fever    Objective:   Physical Exam VITAL SIGNS:  See vs page GENERAL: no distress NECK: Thyroid is slightly and diffusely enlarged.  No thyroid nodule is palpable.  No palpable lymphadenopathy at the  anterior neck.     Lab Results  Component Value Date   TSH 3.32 12/18/2019       Assessment & Plan:  HTN: is noted today Hyperthyroidism: well-controlled.  Please continue the same medication Please come back for a follow-up appointment in 6 months

## 2019-12-18 NOTE — Patient Instructions (Signed)
Your blood pressure is high today.  Please continue to see your heart dr for this.   blood tests are requested for you today.  We'll let you know about the results.  If ever you have fever while taking methimazole, stop it and call us, even if the reason is obvious, because of the risk of a rare side-effect.  It is best to never miss the medication.  However, if you do miss it, next best is to double up the next time.     Please come back for a follow-up appointment in 6 months.

## 2019-12-27 ENCOUNTER — Encounter: Payer: Medicare HMO | Admitting: Gynecology

## 2019-12-27 ENCOUNTER — Other Ambulatory Visit: Payer: Self-pay

## 2019-12-27 ENCOUNTER — Ambulatory Visit (INDEPENDENT_AMBULATORY_CARE_PROVIDER_SITE_OTHER): Payer: Medicare HMO | Admitting: Obstetrics and Gynecology

## 2019-12-27 ENCOUNTER — Encounter: Payer: Self-pay | Admitting: Obstetrics and Gynecology

## 2019-12-27 VITALS — BP 120/72 | Ht 61.0 in | Wt 140.0 lb

## 2019-12-27 DIAGNOSIS — Z01419 Encounter for gynecological examination (general) (routine) without abnormal findings: Secondary | ICD-10-CM | POA: Diagnosis not present

## 2019-12-27 DIAGNOSIS — M8580 Other specified disorders of bone density and structure, unspecified site: Secondary | ICD-10-CM

## 2019-12-27 DIAGNOSIS — Z124 Encounter for screening for malignant neoplasm of cervix: Secondary | ICD-10-CM | POA: Diagnosis not present

## 2019-12-27 NOTE — Addendum Note (Signed)
Addended by: Nelva Nay on: 12/27/2019 10:35 AM   Modules accepted: Orders

## 2019-12-27 NOTE — Progress Notes (Signed)
[name] 1951/02/12 [id number]  SUBJECTIVE:  69 y.o. G2P2002 female for annual routine gynecologic exam and Pap smear. She has no gynecologic concerns.  Current Outpatient Medications  Medication Sig Dispense Refill  . acetaminophen (TYLENOL) 650 MG CR tablet Take 650 mg by mouth every 8 (eight) hours as needed for pain.    . Calcium Carb-Cholecalciferol (CALCIUM-VITAMIN D) 600-400 MG-UNIT TABS Take 2 tablets by mouth 2 (two) times daily.     Marland Kitchen lisinopril (ZESTRIL) 20 MG tablet Take 20 mg by mouth daily.    Marland Kitchen lisinopril-hydrochlorothiazide (ZESTORETIC) 20-12.5 MG tablet Take 1 tablet by mouth daily.    . meloxicam (MOBIC) 15 MG tablet Take 15 mg by mouth daily.    . methimazole (TAPAZOLE) 5 MG tablet Take 1 tablet (5 mg total) by mouth 3 (three) times a week. 50 tablet 3  . metoprolol succinate (TOPROL-XL) 50 MG 24 hr tablet Take 1 tablet (50 mg total) by mouth daily. Take with or immediately following a meal. 90 tablet 3  . Multiple Vitamins-Minerals (WOMENS 50+ MULTI VITAMIN/MIN PO) Take 1 tablet by mouth daily.    Marland Kitchen OVER THE COUNTER MEDICATION Take 1 tablet by mouth daily. Med Name: Mega Red fish oil,krill oil - JOINT CARE    . rivaroxaban (XARELTO) 20 MG TABS tablet TAKE 1 TABLET BY MOUTH ONCE DAILY WITH SUPPER 30 tablet 11  . simvastatin (ZOCOR) 40 MG tablet TAKE 1 TABLET BY MOUTH ONCE DAILY AT BEDTIME 90 tablet 1  . VITAMIN D PO Take by mouth.     No current facility-administered medications for this visit.   Allergies: Vicodin [hydrocodone-acetaminophen]  No LMP recorded. Patient is postmenopausal.  Past medical history,surgical history, problem list, medications, allergies, family history and social history were all reviewed and documented as reviewed in the EPIC chart.  ROS:  Feeling well. No dyspnea or chest pain on exertion.  No abdominal pain, change in bowel habits, black or bloody stools.  No urinary tract symptoms. GYN ROS: no abnormal bleeding, pelvic pain or  discharge, no breast pain or new or enlarging lumps on self exam. No neurological complaints.   OBJECTIVE:  Ht 5\' 1"  (1.549 m)   Wt 140 lb (63.5 kg)   BMI 26.45 kg/m  The patient appears well, alert, oriented x 3, in no distress. ENT normal.  Neck supple. No cervical or supraclavicular adenopathy or thyromegaly.  Lungs are clear, good air entry, no wheezes, rhonchi or rales. S1 and S2 normal, no murmurs, regular rate and rhythm.  Abdomen soft without tenderness, guarding, mass or organomegaly.  Neurological is normal, no focal findings.  BREAST EXAM: breasts appear normal, no suspicious masses, no skin or nipple changes or axillary nodes  PELVIC EXAM: VULVA: normal appearing vulva with no masses, tenderness or lesions, VAGINA: normal appearing vagina with normal color and discharge, no lesions, CERVIX: normal appearing cervix without discharge or lesions, cervical stenosis present at external os and unable to relieve this with cervical os finder, UTERUS: uterus is normal size, shape, consistency and nontender, ADNEXA: normal adnexa in size, nontender and no masses, PAP: Pap smear done today, thin-prep method  Chaperone: [name] present during the examination  ASSESSMENT:  69 y.o. [id number] here for annual gynecologic exam  PLAN:   1. Postmenopausal.  No significant hot flashes or night sweats.  No vaginal bleeding. 2. Pap smear 11/2016.  No significant history of abnormal Pap smears.  We discussed option of discontinuing Pap smear screening based on current screening guidelines,  versus less frequent intervals.  She would like to continue so we did try to collect a Pap smear today.  I did caution her since she did have cervical stenosis we may not have obtained a sufficient sample for the Pap smear to be analyzed by pathology.  If that is the case on the results we will decide how to proceed from there in regards to future screening. 3. Mammogram 03/2019.  Normal breast exam today.  She is  reminded to schedule an annual mammogram this year when due. 4. Colonoscopy 2013.  Recommended that she follow up at the recommended interval.  5. Osteopenia.  DEXA 01/2019.  T score -1.8, stable BMD overall.  Next DEXA recommended in 2 years, we will have her schedule this at that time.  Courage staying active and healthy, she is taking vitamin D and calcium. 6. Health maintenance.  No labs today as she normally has these completed swear.  Return annually or sooner, prn.  Joseph Pierini MD 12/27/19

## 2019-12-31 ENCOUNTER — Encounter: Payer: Self-pay | Admitting: Obstetrics and Gynecology

## 2019-12-31 LAB — PAP IG W/ RFLX HPV ASCU

## 2020-01-01 ENCOUNTER — Encounter: Payer: Self-pay | Admitting: Obstetrics and Gynecology

## 2020-01-15 DIAGNOSIS — R69 Illness, unspecified: Secondary | ICD-10-CM | POA: Diagnosis not present

## 2020-01-21 ENCOUNTER — Other Ambulatory Visit: Payer: Self-pay | Admitting: Endocrinology

## 2020-02-11 ENCOUNTER — Other Ambulatory Visit: Payer: Self-pay | Admitting: Cardiovascular Disease

## 2020-02-19 DIAGNOSIS — I10 Essential (primary) hypertension: Secondary | ICD-10-CM | POA: Diagnosis not present

## 2020-02-19 DIAGNOSIS — E0590 Thyrotoxicosis, unspecified without thyrotoxic crisis or storm: Secondary | ICD-10-CM | POA: Diagnosis not present

## 2020-02-19 DIAGNOSIS — I480 Paroxysmal atrial fibrillation: Secondary | ICD-10-CM | POA: Diagnosis not present

## 2020-02-19 DIAGNOSIS — E7849 Other hyperlipidemia: Secondary | ICD-10-CM | POA: Diagnosis not present

## 2020-02-21 ENCOUNTER — Ambulatory Visit (INDEPENDENT_AMBULATORY_CARE_PROVIDER_SITE_OTHER): Payer: Medicare HMO | Admitting: *Deleted

## 2020-02-21 DIAGNOSIS — I442 Atrioventricular block, complete: Secondary | ICD-10-CM

## 2020-02-22 LAB — CUP PACEART REMOTE DEVICE CHECK
Battery Impedance: 1981 Ohm
Battery Remaining Longevity: 37 mo
Battery Voltage: 2.76 V
Brady Statistic AP VP Percent: 10 %
Brady Statistic AP VS Percent: 0 %
Brady Statistic AS VP Percent: 90 %
Brady Statistic AS VS Percent: 0 %
Date Time Interrogation Session: 20210902101134
Implantable Lead Implant Date: 20110208
Implantable Lead Implant Date: 20110208
Implantable Lead Location: 753859
Implantable Lead Location: 753860
Implantable Lead Model: 5076
Implantable Lead Model: 5076
Implantable Pulse Generator Implant Date: 20110208
Lead Channel Impedance Value: 460 Ohm
Lead Channel Impedance Value: 562 Ohm
Lead Channel Pacing Threshold Amplitude: 0.25 V
Lead Channel Pacing Threshold Amplitude: 0.75 V
Lead Channel Pacing Threshold Pulse Width: 0.4 ms
Lead Channel Pacing Threshold Pulse Width: 0.4 ms
Lead Channel Setting Pacing Amplitude: 1.5 V
Lead Channel Setting Pacing Amplitude: 2.25 V
Lead Channel Setting Pacing Pulse Width: 0.4 ms
Lead Channel Setting Sensing Sensitivity: 2 mV

## 2020-02-26 NOTE — Progress Notes (Signed)
Remote pacemaker transmission.   

## 2020-03-06 ENCOUNTER — Ambulatory Visit: Payer: Medicare HMO | Attending: Internal Medicine

## 2020-03-06 DIAGNOSIS — Z23 Encounter for immunization: Secondary | ICD-10-CM

## 2020-03-06 NOTE — Progress Notes (Signed)
   Covid-19 Vaccination Clinic  Name:  AHNIKA HANNIBAL    MRN: [id number] DOB: [birth year]  03/06/2020  Ms. Bene was observed post Covid-19 immunization for 15 minutes without incident. She was provided with Vaccine Information Sheet and instruction to access the V-Safe system.   Ms. Firestine was instructed to call 911 with any severe reactions post vaccine: Marland Kitchen Difficulty breathing  . Swelling of face and throat  . A fast heartbeat  . A bad rash all over body  . Dizziness and weakness

## 2020-03-07 ENCOUNTER — Other Ambulatory Visit (HOSPITAL_COMMUNITY): Payer: Self-pay | Admitting: Family Medicine

## 2020-03-07 DIAGNOSIS — Z1231 Encounter for screening mammogram for malignant neoplasm of breast: Secondary | ICD-10-CM

## 2020-03-17 ENCOUNTER — Other Ambulatory Visit: Payer: Self-pay | Admitting: Cardiovascular Disease

## 2020-03-17 ENCOUNTER — Encounter: Payer: Medicare HMO | Admitting: Cardiovascular Disease

## 2020-03-17 NOTE — Telephone Encounter (Signed)
Please review for refill, Thanks !  

## 2020-03-20 DIAGNOSIS — I10 Essential (primary) hypertension: Secondary | ICD-10-CM | POA: Diagnosis not present

## 2020-03-20 DIAGNOSIS — M1991 Primary osteoarthritis, unspecified site: Secondary | ICD-10-CM | POA: Diagnosis not present

## 2020-03-20 DIAGNOSIS — M810 Age-related osteoporosis without current pathological fracture: Secondary | ICD-10-CM | POA: Diagnosis not present

## 2020-03-31 ENCOUNTER — Ambulatory Visit (INDEPENDENT_AMBULATORY_CARE_PROVIDER_SITE_OTHER): Payer: Medicare HMO | Admitting: Cardiovascular Disease

## 2020-03-31 VITALS — BP 136/72 | HR 68 | Ht 61.0 in | Wt 135.4 lb

## 2020-03-31 DIAGNOSIS — E7800 Pure hypercholesterolemia, unspecified: Secondary | ICD-10-CM | POA: Diagnosis not present

## 2020-03-31 DIAGNOSIS — I480 Paroxysmal atrial fibrillation: Secondary | ICD-10-CM

## 2020-03-31 DIAGNOSIS — Z7901 Long term (current) use of anticoagulants: Secondary | ICD-10-CM

## 2020-03-31 DIAGNOSIS — Z950 Presence of cardiac pacemaker: Secondary | ICD-10-CM | POA: Diagnosis not present

## 2020-03-31 DIAGNOSIS — I442 Atrioventricular block, complete: Secondary | ICD-10-CM | POA: Diagnosis not present

## 2020-03-31 DIAGNOSIS — I10 Essential (primary) hypertension: Secondary | ICD-10-CM | POA: Diagnosis not present

## 2020-03-31 LAB — PACEMAKER DEVICE OBSERVATION

## 2020-03-31 NOTE — Patient Instructions (Addendum)
Medication Instructions:  No changes Xarelto 20 mg samples have been given: 3 bottles *If you need a refill on your cardiac medications before your next appointment, please call your pharmacy*   Lab Work: Your provider would like for you to have the following labs today: CMET and fasting Lipid  If you have labs (blood work) drawn today and your tests are completely normal, you will receive your results only by:  [place] (if you have MyChart) OR  A paper copy in the mail If you have any lab test that is abnormal or we need to change your treatment, we will call you to review the results.   Testing/Procedures: None ordered   Follow-Up: At [facility], you and your health needs are our priority.  As part of our continuing mission to provide you with exceptional heart care, we have created designated Provider Care Teams.  These Care Teams include your primary Cardiologist (physician) and Advanced Practice Providers (APPs -  Physician Assistants and Nurse Practitioners) who all work together to provide you with the care you need, when you need it.  We recommend signing up for the patient portal called "MyChart".  Sign up information is provided on this After Visit Summary.  MyChart is used to connect with patients for Virtual Visits (Telemedicine).  Patients are able to view lab/test results, encounter notes, upcoming appointments, etc.  Non-urgent messages can be sent to your provider as well.   To learn more about what you can do with MyChart, go to [email or web address].    Your next appointment:   12 month(s)  The format for your next appointment:   In Person  Provider:   Sanda Klein, MD

## 2020-03-31 NOTE — Progress Notes (Signed)
Cardiology Office Note    Date:  04/01/2020   ID:  LE FERRAZ, DOB [birth year], MRN [id number]  PCP:  Sharilyn Sites, MD  Cardiologist:  Quay Burow, MD: Sanda Klein, MD   Chief Complaint  Patient presents with  . Follow-up  Pacemaker/complete heart block/atrial fibrillation  History of Present Illness:  [name] is a 69 y.o. female with complete heart block, paroxysmal atrial fibrillation, hypertension, hyperlipidemia returning for follow-up.  Her device was implanted for symptomatic bradycardia and she has a history of left bundle branch block. She subsequently has developed complete heart block and is now pacemaker dependent. Her Medtronic Adapta device was implanted in 2011.  Subsequent to the pacemaker implantation she developed episodes of infrequent paroxysmal atrial fibrillation that have been consistently asymptomatic.  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  The episodes of atrial fibrillation were not clearly associated with a thyroid disorder and have occurred even when she became euthyroid.  The patient specifically denies any chest pain at rest exertion, dyspnea at rest or with exertion, orthopnea, paroxysmal nocturnal dyspnea, syncope, palpitations, focal neurological deficits, intermittent claudication, lower extremity edema, unexplained weight gain, cough, hemoptysis or wheezing.  Based on current voltage there are 3 years of estimated generator longevity remaining. There is 90%  atrial pacing and 100% ventricular pacing. Since her last office pacemaker check the overall burden of atrial fibrillation remains low at 0.3 %.  As before, the episodes are quite infrequent, but can last for about 3-7 hours (most recently on August 20).  She also had a single lengthy episodes of nonsustained VT (22 beats, 9 seconds at 197 bpm, in March 2021).  She has well-controlled hypertension and takes a statin for  hyperlipidemia, with labs monitored via Dr. Hilma Favors. She had a normal nuclear perfusion stress test in May 2017  Past Medical History:  Diagnosis Date  . Arthritis   . Atrial tachycardia ([place])   . Atypical chest pain   . Bundle branch block left    Dr. Rollene Fare is cardiologist  . Elevated cholesterol   . Hypertension   . Osteopenia 01/2019   T score -1.8  . Pacemaker   . Paroxysmal atrial fibrillation (HCC)    rrare episodes on pacemaker interrogation  . Wears glasses     Past Surgical History:  Procedure Laterality Date  . CARDIAC CATHETERIZATION  2005   Ejection fraction is estimated at  greater than or equal to 60 %  Wall motion is normal.  . CAROTID DUPLEX BILATERAL  march 08,2005   Impression : No evidence of of signficant plaque formation or stenosis.  . CARPAL TUNNEL RELEASE  04/30/2011   Procedure: CARPAL TUNNEL RELEASE;  Surgeon: Cammie Sickle., MD;  Location: [facility];  Service: Orthopedics;  Laterality: Left;  left carpal tunnel release, inject left thumb  . CARPAL TUNNEL RELEASE  04/28/2012   Procedure: CARPAL TUNNEL RELEASE;  Surgeon: Cammie Sickle., MD;  Location: [facility];  Service: Orthopedics;  Laterality: Right;  . COLONOSCOPY    . COLONOSCOPY  04/14/2012   Procedure: COLONOSCOPY;  Surgeon: Daneil Dolin, MD;  [facility];  Service: Endoscopy;  Laterality: N/A;  8:30 AM  . DORSAL COMPARTMENT RELEASE  04/30/2011   Procedure: RELEASE DORSAL COMPARTMENT (DEQUERVAIN);  Surgeon: Cammie Sickle., MD;  [facility];  Service: Orthopedics;  Laterality: Left;  release 1st dorsal compartment on left  .  INSERT / REPLACE / REMOVE PACEMAKER  07/29/2009   PPM-Dual Chamber Systems Fluoro Guidance Venogram performed 10 ml 's to the left subclavian vein  . PACEMAKER INSERTION  07/2009   Dr. Rollene Fare  Pacemaker is a Adapta L implanted 07/2009  . RENAL CIRCULATION  07/ 08/ 08   normal renal duplex  doppler   . TRANSTHORACIC ECHOCARDIOGRAM  10/18/2012   Atrial tracking & V paced rhythm with pacer induced LBBB. No significant intra -ventricular  dyssynchrony.  . TUBAL LIGATION  1986    Current Medications: Outpatient Medications Prior to Visit  Medication Sig Dispense Refill  . acetaminophen (TYLENOL) 650 MG CR tablet Take 650 mg by mouth every 8 (eight) hours as needed for pain.    . Calcium Carb-Cholecalciferol (CALCIUM-VITAMIN D) 600-400 MG-UNIT TABS Take 2 tablets by mouth 2 (two) times daily.     Marland Kitchen lisinopril (ZESTRIL) 20 MG tablet Take 20 mg by mouth daily.    Marland Kitchen lisinopril-hydrochlorothiazide (ZESTORETIC) 20-12.5 MG tablet Take 1 tablet by mouth daily.    . meloxicam (MOBIC) 15 MG tablet Take 15 mg by mouth daily.    . methimazole (TAPAZOLE) 5 MG tablet Take 1 tablet (5 mg total) by mouth 3 (three) times a week. 12 tablet 2  . metoprolol succinate (TOPROL-XL) 50 MG 24 hr tablet Take 1 tablet (50 mg total) by mouth daily. Take with or immediately following a meal. 90 tablet 3  . Multiple Vitamins-Minerals (WOMENS 50+ MULTI VITAMIN/MIN PO) Take 1 tablet by mouth daily.    Marland Kitchen OVER THE COUNTER MEDICATION Take 1 tablet by mouth daily. Med Name: Mega Red fish oil,krill oil - JOINT CARE    . simvastatin (ZOCOR) 40 MG tablet TAKE 1 TABLET BY MOUTH ONCE DAILY AT BEDTIME 90 tablet 1  . VITAMIN D PO Take by mouth.    Alveda Reasons 20 MG TABS tablet TAKE 1 TABLET BY MOUTH ONCE DAILY WITH SUPPER 90 tablet 1   No facility-administered medications prior to visit.     Allergies:   Vicodin [hydrocodone-acetaminophen]   Social History   Socioeconomic History  . Marital status: Married    Spouse name: Not on file  . Number of children: Not on file  . Years of education: Not on file  . Highest education level: Not on file  Occupational History  . Not on file  Tobacco Use  . Smoking status: Former Smoker    Packs/day: 1.50    Years: 30.00    Pack years: 45.00    Types: Cigarettes  .  Smokeless tobacco: Never Used  Vaping Use  . Vaping Use: Never used  Substance and Sexual Activity  . Alcohol use: Yes    Alcohol/week: 7.0 standard drinks    Types: 7 Glasses of wine per week  . Drug use: No  . Sexual activity: Not Currently    Birth control/protection: Post-menopausal, Surgical    Comment: Tubal lig-1st intercourse 69 yo-Fewer than 5 partners  Other Topics Concern  . Not on file  Social History Narrative  . Not on file   Social Determinants of Health   Financial Resource Strain:   . Difficulty of Paying Living Expenses: Not on file  Food Insecurity:   . Worried About Charity fundraiser in the Last Year: Not on file  . Ran Out of Food in the Last Year: Not on file  Transportation Needs:   . Lack of Transportation (Medical): Not on file  . Lack of Transportation (Non-Medical): Not on  file  Physical Activity:   . Days of Exercise per Week: Not on file  . Minutes of Exercise per Session: Not on file  Stress:   . Feeling of Stress : Not on file  Social Connections:   . Frequency of Communication with Friends and Family: Not on file  . Frequency of Social Gatherings with Friends and Family: Not on file  . Attends Religious Services: Not on file  . Active Member of Clubs or Organizations: Not on file  . Attends Archivist Meetings: Not on file  . Marital Status: Not on file     Family History:  The patient's family history includes Colon cancer in her maternal aunt; Diabetes in her father; Heart disease in her mother and paternal grandfather.   ROS:   Please see the history of present illness.    ROS All other systems are reviewed and are negative.   PHYSICAL EXAM:   VS:  BP 136/72   Pulse 68   Ht 5\' 1"  (1.549 m)   Wt 135 lb 6.4 oz (61.4 kg)   SpO2 98%   BMI 25.58 kg/m       General: Alert, oriented x3, no distress, healthy right subclavian pacemaker site Head: no evidence of trauma, PERRL, EOMI, no exophtalmos or lid lag, no myxedema,  no xanthelasma; normal ears, nose and oropharynx Neck: normal jugular venous pulsations and no hepatojugular reflux; brisk carotid pulses without delay and no carotid bruits Chest: clear to auscultation, no signs of consolidation by percussion or palpation, normal fremitus, symmetrical and full respiratory excursions Cardiovascular: normal position and quality of the apical impulse, regular rhythm, normal first and paradoxically split second heart sounds, no murmurs, rubs or gallops Abdomen: no tenderness or distention, no masses by palpation, no abnormal pulsatility or arterial bruits, normal bowel sounds, no hepatosplenomegaly Extremities: no clubbing, cyanosis or edema; 2+ radial, ulnar and brachial pulses bilaterally; 2+ right femoral, posterior tibial and dorsalis pedis pulses; 2+ left femoral, posterior tibial and dorsalis pedis pulses; no subclavian or femoral bruits Neurological: grossly nonfocal Psych: Normal mood and affect    Wt Readings from Last 3 Encounters:  03/31/20 135 lb 6.4 oz (61.4 kg)  12/27/19 140 lb (63.5 kg)  12/18/19 139 lb (63 kg)      Studies/Labs Reviewed:   EKG: Ordered today shows atrial sensed, ventricular paced rhythm.  The QRS is quite broad at 154 ms with appropriate secondary prolongation of the QTC at 493 ms  Recent Labs: 12/18/2019: TSH 3.32   Lipid Panel    Component Value Date/Time   CHOL 163 04/05/2017 0820   TRIG 175 (H) 04/05/2017 0820   HDL 54 04/05/2017 0820   CHOLHDL 3.0 04/05/2017 0820   CHOLHDL 3.3 03/28/2015 0859   VLDL 27 03/28/2015 0859   LDLCALC 74 04/05/2017 0820    April 17, 2019 Total cholesterol 171, HDL 55, LDL 88, triglycerides 162 Hemoglobin A1c 5.5% Hemoglobin 14.4, creatinine 0.87, potassium 5.3, normal liver function tests TSH 3.32 on 12/18/2019 ASSESSMENT:    1. CHB (complete heart block) ([place])   2. Pacemaker   3. Paroxysmal atrial fibrillation (HCC)   4. Long term (current) use of anticoagulants   5.  Hypercholesterolemia   6. Essential hypertension      PLAN:  In order of problems listed above:  1. CHB: Pacemaker dependent. 2. PPM: Normal function.  Remote downloads every 3 months and yearly office visits. 3. AFib: Episodes remain infrequent, but can be lengthy.  CHADVasc score  of 53 (age, gender, HTN). 4. Xarelto: Denies bleeding complication.  Asked about the safety of taking occasional NSAIDs for her arthritis.  As long as these are used sparingly and she uses acetaminophen preferentially, the risk of bleeding should be relatively low.  Consider switching to Eliquis for lower GI bleeding potential, if this becomes an issue. 5. HTN: Well-controlled. 6. HLP: To have repeat lipid profile performed next month with Dr. Hilma Favors.    Medication Adjustments/Labs and Tests Ordered: Current medicines are reviewed at length with the patient today.  Concerns regarding medicines are outlined above.  Medication changes, Labs and Tests ordered today are listed in the Patient Instructions below. Patient Instructions  Medication Instructions:  No changes Xarelto 20 mg samples have been given: 3 bottles *If you need a refill on your cardiac medications before your next appointment, please call your pharmacy*   Lab Work: Your provider would like for you to have the following labs today: CMET and fasting Lipid  If you have labs (blood work) drawn today and your tests are completely normal, you will receive your results only by: Marland Kitchen MyChart Message (if you have MyChart) OR . A paper copy in the mail If you have any lab test that is abnormal or we need to change your treatment, we will call you to review the results.   Testing/Procedures: None ordered   Follow-Up: At [facility], you and your health needs are our priority.  As part of our continuing mission to provide you with exceptional heart care, we have created designated Provider Care Teams.  These Care Teams include your primary  Cardiologist (physician) and Advanced Practice Providers (APPs -  Physician Assistants and Nurse Practitioners) who all work together to provide you with the care you need, when you need it.  We recommend signing up for the patient portal called "MyChart".  Sign up information is provided on this After Visit Summary.  MyChart is used to connect with patients for Virtual Visits (Telemedicine).  Patients are able to view lab/test results, encounter notes, upcoming appointments, etc.  Non-urgent messages can be sent to your provider as well.   To learn more about what you can do with MyChart, go to [email or web address].    Your next appointment:   12 month(s)  The format for your next appointment:   In Person  Provider:   Sanda Klein, MD     Signed, Sanda Klein, MD  04/01/2020 10:14 AM    [address], [place], [place]  [address] Phone: [phone]; Fax: [phone]

## 2020-04-01 ENCOUNTER — Encounter: Payer: Self-pay | Admitting: Cardiovascular Disease

## 2020-04-01 DIAGNOSIS — H5200 Hypermetropia, unspecified eye: Secondary | ICD-10-CM | POA: Diagnosis not present

## 2020-04-01 DIAGNOSIS — Z0100 Encounter for examination of eyes and vision without abnormal findings: Secondary | ICD-10-CM | POA: Diagnosis not present

## 2020-04-14 DIAGNOSIS — Z23 Encounter for immunization: Secondary | ICD-10-CM | POA: Diagnosis not present

## 2020-04-21 ENCOUNTER — Other Ambulatory Visit: Payer: Self-pay

## 2020-04-21 ENCOUNTER — Ambulatory Visit (HOSPITAL_COMMUNITY)
Admission: RE | Admit: 2020-04-21 | Discharge: 2020-04-21 | Disposition: A | Discharge status: Home / Self Care | Payer: Medicare HMO | Source: Ambulatory Visit | Attending: Family Medicine | Admitting: Family Medicine

## 2020-04-21 DIAGNOSIS — Z1231 Encounter for screening mammogram for malignant neoplasm of breast: Secondary | ICD-10-CM | POA: Diagnosis not present

## 2020-04-25 DIAGNOSIS — E0590 Thyrotoxicosis, unspecified without thyrotoxic crisis or storm: Secondary | ICD-10-CM | POA: Diagnosis not present

## 2020-04-25 DIAGNOSIS — Z6825 Body mass index (BMI) 25.0-25.9, adult: Secondary | ICD-10-CM | POA: Diagnosis not present

## 2020-04-25 DIAGNOSIS — R7309 Other abnormal glucose: Secondary | ICD-10-CM | POA: Diagnosis not present

## 2020-04-25 DIAGNOSIS — Z1331 Encounter for screening for depression: Secondary | ICD-10-CM | POA: Diagnosis not present

## 2020-04-25 DIAGNOSIS — Z0000 Encounter for general adult medical examination without abnormal findings: Secondary | ICD-10-CM | POA: Diagnosis not present

## 2020-04-25 DIAGNOSIS — M1991 Primary osteoarthritis, unspecified site: Secondary | ICD-10-CM | POA: Diagnosis not present

## 2020-04-25 DIAGNOSIS — I10 Essential (primary) hypertension: Secondary | ICD-10-CM | POA: Diagnosis not present

## 2020-04-28 DIAGNOSIS — E7800 Pure hypercholesterolemia, unspecified: Secondary | ICD-10-CM | POA: Diagnosis not present

## 2020-04-28 DIAGNOSIS — E039 Hypothyroidism, unspecified: Secondary | ICD-10-CM | POA: Diagnosis not present

## 2020-04-28 DIAGNOSIS — Z1389 Encounter for screening for other disorder: Secondary | ICD-10-CM | POA: Diagnosis not present

## 2020-04-28 DIAGNOSIS — E7849 Other hyperlipidemia: Secondary | ICD-10-CM | POA: Diagnosis not present

## 2020-04-28 DIAGNOSIS — I480 Paroxysmal atrial fibrillation: Secondary | ICD-10-CM | POA: Diagnosis not present

## 2020-04-28 DIAGNOSIS — Z0000 Encounter for general adult medical examination without abnormal findings: Secondary | ICD-10-CM | POA: Diagnosis not present

## 2020-04-28 DIAGNOSIS — Z950 Presence of cardiac pacemaker: Secondary | ICD-10-CM | POA: Diagnosis not present

## 2020-04-28 DIAGNOSIS — M1991 Primary osteoarthritis, unspecified site: Secondary | ICD-10-CM | POA: Diagnosis not present

## 2020-04-29 LAB — COMPREHENSIVE METABOLIC PANEL
ALT: 29 IU/L (ref 0–32)
AST: 24 IU/L (ref 0–40)
Albumin/Globulin Ratio: 2 (ref 1.2–2.2)
Albumin: 4.5 g/dL (ref 3.8–4.8)
Alkaline Phosphatase: 61 IU/L (ref 44–121)
BUN/Creatinine Ratio: 17 (ref 12–28)
BUN: 14 mg/dL (ref 8–27)
Bilirubin Total: 0.4 mg/dL (ref 0.0–1.2)
CO2: 25 mmol/L (ref 20–29)
Calcium: 9.9 mg/dL (ref 8.7–10.3)
Chloride: 104 mmol/L (ref 96–106)
Creatinine, Ser: 0.81 mg/dL (ref 0.57–1.00)
GFR calc Af Amer: 85 mL/min/{1.73_m2} (ref >59)
GFR calc non Af Amer: 74 mL/min/{1.73_m2} (ref >59)
Globulin, Total: 2.3 g/dL (ref 1.5–4.5)
Glucose: 105 mg/dL — ABNORMAL HIGH (ref 65–99)
Potassium: 4.3 mmol/L (ref 3.5–5.2)
Sodium: 143 mmol/L (ref 134–144)
Total Protein: 6.8 g/dL (ref 6.0–8.5)

## 2020-04-29 LAB — LIPID PANEL
Chol/HDL Ratio: 3.4 ratio (ref 0.0–4.4)
Cholesterol, Total: 187 mg/dL (ref 100–199)
HDL: 55 mg/dL
LDL Chol Calc (NIH): 105 mg/dL — ABNORMAL HIGH (ref 0–99)
Triglycerides: 154 mg/dL — ABNORMAL HIGH (ref 0–149)
VLDL Cholesterol Cal: 27 mg/dL (ref 5–40)

## 2020-05-01 ENCOUNTER — Telehealth: Payer: Self-pay | Admitting: Cardiovascular Disease

## 2020-05-01 MED ORDER — ATORVASTATIN CALCIUM 40 MG PO TABS: 40.0000 mg | ORAL_TABLET | Freq: Every day | ORAL | 11 refills | Status: DC

## 2020-05-01 NOTE — Telephone Encounter (Signed)
Patient made aware of results and verbalized understanding. Atorvastatin 40 mg once daily has been sent it for her.    Borderline high glucose, just like last year. Other routine labs are great.  Total cholesterol and LDL are higher than last few years and slightly out of desired range. Please confirm that she is taking simvastatin and that she is taking it with dinner/bedtime. If she is taking the simvastatin correctly, I would recommend switching to atorvastatin 40 mg daily.

## 2020-05-01 NOTE — Telephone Encounter (Signed)
Pt called in returning [place] call about her results   Best number [phone]

## 2020-05-22 ENCOUNTER — Ambulatory Visit (INDEPENDENT_AMBULATORY_CARE_PROVIDER_SITE_OTHER): Payer: Medicare HMO

## 2020-05-22 DIAGNOSIS — I442 Atrioventricular block, complete: Secondary | ICD-10-CM | POA: Diagnosis not present

## 2020-05-22 LAB — CUP PACEART REMOTE DEVICE CHECK
Battery Impedance: 2097 Ohm
Battery Remaining Longevity: 35 mo
Battery Voltage: 2.76 V
Brady Statistic AP VP Percent: 11 %
Brady Statistic AP VS Percent: 0 %
Brady Statistic AS VP Percent: 89 %
Brady Statistic AS VS Percent: 0 %
Date Time Interrogation Session: 20211202083615
Implantable Lead Implant Date: 20110208
Implantable Lead Implant Date: 20110208
Implantable Lead Location: 753859
Implantable Lead Location: 753860
Implantable Lead Model: 5076
Implantable Lead Model: 5076
Implantable Pulse Generator Implant Date: 20110208
Lead Channel Impedance Value: 458 Ohm
Lead Channel Impedance Value: 552 Ohm
Lead Channel Pacing Threshold Amplitude: 0.375 V
Lead Channel Pacing Threshold Amplitude: 0.75 V
Lead Channel Pacing Threshold Pulse Width: 0.4 ms
Lead Channel Pacing Threshold Pulse Width: 0.4 ms
Lead Channel Setting Pacing Amplitude: 1.5 V
Lead Channel Setting Pacing Amplitude: 2.25 V
Lead Channel Setting Pacing Pulse Width: 0.4 ms
Lead Channel Setting Sensing Sensitivity: 2 mV

## 2020-05-30 NOTE — Progress Notes (Signed)
Remote pacemaker transmission.   

## 2020-06-02 ENCOUNTER — Other Ambulatory Visit: Payer: Self-pay

## 2020-06-02 ENCOUNTER — Encounter: Payer: Self-pay | Admitting: Endocrinology

## 2020-06-02 ENCOUNTER — Ambulatory Visit: Payer: Medicare HMO | Admitting: Endocrinology

## 2020-06-02 VITALS — BP 138/68 | HR 68 | Ht 61.0 in | Wt 138.0 lb

## 2020-06-02 DIAGNOSIS — E0590 Thyrotoxicosis, unspecified without thyrotoxic crisis or storm: Secondary | ICD-10-CM

## 2020-06-02 LAB — TSH: TSH: 2.84 u[IU]/mL (ref 0.35–4.50)

## 2020-06-02 LAB — T4, FREE: Free T4: 0.86 ng/dL (ref 0.60–1.60)

## 2020-06-02 MED ORDER — METHIMAZOLE 5 MG PO TABS: 5.0000 mg | ORAL_TABLET | ORAL | 3 refills | Status: DC

## 2020-06-02 NOTE — Patient Instructions (Addendum)
blood tests are requested for you today.  We'll let you know about the results.  If ever you have fever while taking methimazole, stop it and call us, even if the reason is obvious, because of the risk of a rare side-effect.   It is best to never miss the medication.  However, if you do miss it, next best is to double up the next time.     Please come back for a follow-up appointment in 6 months.

## 2020-06-02 NOTE — Progress Notes (Signed)
Subjective:    Patient ID: [name], female    DOB: 25-Apr-1951, 69 y.o.   MRN: [id number]  HPI Pt returns for f/u of hyperthyroidism (dx'ed 2017; she has never had thyroid imaging, but Grave's Dz is suggested by physical exam; she declines RAI).  Since takes tapazole, 5 mg, just 1 per week.  pt states she feels well in general.   Past Medical History:  Diagnosis Date  . Arthritis   . Atrial tachycardia ([place])   . Atypical chest pain   . Bundle branch block left    Dr. Rollene Fare is cardiologist  . Elevated cholesterol   . Hypertension   . Osteopenia 01/2019   T score -1.8  . Pacemaker   . Paroxysmal atrial fibrillation (HCC)    rrare episodes on pacemaker interrogation  . Wears glasses     Past Surgical History:  Procedure Laterality Date  . CARDIAC CATHETERIZATION  2005   Ejection fraction is estimated at  greater than or equal to 60 %  Wall motion is normal.  . CAROTID DUPLEX BILATERAL  march 08,2005   Impression : No evidence of of signficant plaque formation or stenosis.  . CARPAL TUNNEL RELEASE  04/30/2011   Procedure: CARPAL TUNNEL RELEASE;  Surgeon: Cammie Sickle., MD;  Location: [facility];  Service: Orthopedics;  Laterality: Left;  left carpal tunnel release, inject left thumb  . CARPAL TUNNEL RELEASE  04/28/2012   Procedure: CARPAL TUNNEL RELEASE;  Surgeon: Cammie Sickle., MD;  Location: [facility];  Service: Orthopedics;  Laterality: Right;  . COLONOSCOPY    . COLONOSCOPY  04/14/2012   Procedure: COLONOSCOPY;  Surgeon: Daneil Dolin, MD;  [facility];  Service: Endoscopy;  Laterality: N/A;  8:30 AM  . DORSAL COMPARTMENT RELEASE  04/30/2011   Procedure: RELEASE DORSAL COMPARTMENT (DEQUERVAIN);  Surgeon: Cammie Sickle., MD;  Location: [facility];  Service: Orthopedics;  Laterality: Left;  release 1st dorsal compartment on left  . INSERT / REPLACE / REMOVE PACEMAKER  07/29/2009   PPM-Dual Chamber  Systems Fluoro Guidance Venogram performed 10 ml 's to the left subclavian vein  . PACEMAKER INSERTION  07/2009   Dr. Rollene Fare  Pacemaker is a Adapta L implanted 07/2009  . RENAL CIRCULATION  07/ 08/ 08   normal renal duplex doppler   . TRANSTHORACIC ECHOCARDIOGRAM  10/18/2012   Atrial tracking & V paced rhythm with pacer induced LBBB. No significant intra -ventricular  dyssynchrony.  . TUBAL LIGATION  1986    Social History   Socioeconomic History  . Marital status: Married    Spouse name: Not on file  . Number of children: Not on file  . Years of education: Not on file  . Highest education level: Not on file  Occupational History  . Not on file  Tobacco Use  . Smoking status: Former Smoker    Packs/day: 1.50    Years: 30.00    Pack years: 45.00    Types: Cigarettes  . Smokeless tobacco: Never Used  Vaping Use  . Vaping Use: Never used  Substance and Sexual Activity  . Alcohol use: Yes    Alcohol/week: 7.0 standard drinks    Types: 7 Glasses of wine per week  . Drug use: No  . Sexual activity: Not Currently    Birth control/protection: Post-menopausal, Surgical    Comment: Tubal lig-1st intercourse 69 yo-Fewer than 5 partners  Other Topics Concern  .  Not on file  Social History Narrative  . Not on file   Social Determinants of Health   Financial Resource Strain: Not on file  Food Insecurity: Not on file  Transportation Needs: Not on file  Physical Activity: Not on file  Stress: Not on file  Social Connections: Not on file  Intimate Partner Violence: Not on file    Current Outpatient Medications on File Prior to Visit  Medication Sig Dispense Refill  . acetaminophen (TYLENOL) 650 MG CR tablet Take 650 mg by mouth every 8 (eight) hours as needed for pain.    Marland Kitchen atorvastatin (LIPITOR) 40 MG tablet Take 1 tablet (40 mg total) by mouth daily. 30 tablet 11  . Calcium Carb-Cholecalciferol (CALCIUM-VITAMIN D) 600-400 MG-UNIT TABS Take 2 tablets by mouth 2 (two) times  daily.     Marland Kitchen lisinopril (ZESTRIL) 20 MG tablet Take 20 mg by mouth daily.    Marland Kitchen lisinopril-hydrochlorothiazide (ZESTORETIC) 20-12.5 MG tablet Take 1 tablet by mouth daily.    . meloxicam (MOBIC) 15 MG tablet Take 15 mg by mouth daily.    . metoprolol succinate (TOPROL-XL) 50 MG 24 hr tablet Take 1 tablet (50 mg total) by mouth daily. Take with or immediately following a meal. 90 tablet 3  . Multiple Vitamins-Minerals (WOMENS 50+ MULTI VITAMIN/MIN PO) Take 1 tablet by mouth daily.    Marland Kitchen OVER THE COUNTER MEDICATION Take 1 tablet by mouth daily. Med Name: Mega Red fish oil,krill oil - JOINT CARE    . VITAMIN D PO Take by mouth.    Alveda Reasons 20 MG TABS tablet TAKE 1 TABLET BY MOUTH ONCE DAILY WITH SUPPER 90 tablet 1   No current facility-administered medications on file prior to visit.    Allergies  Allergen Reactions  . Vicodin [Hydrocodone-Acetaminophen] Nausea Only    Family History  Problem Relation Age of Onset  . Heart disease Mother   . Diabetes Father   . Heart disease Paternal Grandfather   . Colon cancer Maternal Aunt   . Thyroid disease Neg Hx     BP 138/68   Pulse 68   Ht 5\' 1"  (1.549 m)   Wt 138 lb (62.6 kg)   SpO2 98%   BMI 26.07 kg/m    Review of Systems Denies fever.      Objective:   Physical Exam VITAL SIGNS:  See vs page GENERAL: no distress NECK: Thyroid is slightly and diffusely enlarged.  No thyroid nodule is palpable.  No palpable lymphadenopathy at the anterior neck.    Lab Results  Component Value Date   CREATININE 0.81 04/28/2020   BUN 14 04/28/2020   NA 143 04/28/2020   K 4.3 04/28/2020   CL 104 04/28/2020   CO2 25 04/28/2020       Assessment & Plan:  Hyperthyroidism: well-controlled.  Please continue the same methimazole

## 2020-06-04 DIAGNOSIS — L821 Other seborrheic keratosis: Secondary | ICD-10-CM | POA: Diagnosis not present

## 2020-06-04 DIAGNOSIS — D225 Melanocytic nevi of trunk: Secondary | ICD-10-CM | POA: Diagnosis not present

## 2020-06-04 DIAGNOSIS — B002 Herpesviral gingivostomatitis and pharyngotonsillitis: Secondary | ICD-10-CM | POA: Diagnosis not present

## 2020-06-18 ENCOUNTER — Ambulatory Visit: Payer: Medicare HMO | Admitting: Endocrinology

## 2020-06-20 DIAGNOSIS — I10 Essential (primary) hypertension: Secondary | ICD-10-CM | POA: Diagnosis not present

## 2020-06-20 DIAGNOSIS — M1991 Primary osteoarthritis, unspecified site: Secondary | ICD-10-CM | POA: Diagnosis not present

## 2020-06-20 DIAGNOSIS — M810 Age-related osteoporosis without current pathological fracture: Secondary | ICD-10-CM | POA: Diagnosis not present

## 2020-07-01 MED ORDER — ROSUVASTATIN CALCIUM 10 MG PO TABS: 10.0000 mg | ORAL_TABLET | Freq: Every day | ORAL | 3 refills | Status: DC

## 2020-07-19 DIAGNOSIS — M810 Age-related osteoporosis without current pathological fracture: Secondary | ICD-10-CM | POA: Diagnosis not present

## 2020-07-19 DIAGNOSIS — I10 Essential (primary) hypertension: Secondary | ICD-10-CM | POA: Diagnosis not present

## 2020-07-19 DIAGNOSIS — M1991 Primary osteoarthritis, unspecified site: Secondary | ICD-10-CM | POA: Diagnosis not present

## 2020-08-09 ENCOUNTER — Other Ambulatory Visit: Payer: Self-pay | Admitting: Cardiovascular Disease

## 2020-08-11 MED ORDER — METOPROLOL SUCCINATE ER 50 MG PO TB24: 50.0000 mg | ORAL_TABLET | Freq: Every day | ORAL | 2 refills | Status: DC

## 2020-08-18 DIAGNOSIS — M1991 Primary osteoarthritis, unspecified site: Secondary | ICD-10-CM | POA: Diagnosis not present

## 2020-08-18 DIAGNOSIS — M810 Age-related osteoporosis without current pathological fracture: Secondary | ICD-10-CM | POA: Diagnosis not present

## 2020-08-18 DIAGNOSIS — I10 Essential (primary) hypertension: Secondary | ICD-10-CM | POA: Diagnosis not present

## 2020-08-21 ENCOUNTER — Ambulatory Visit (INDEPENDENT_AMBULATORY_CARE_PROVIDER_SITE_OTHER): Payer: Medicare HMO

## 2020-08-21 DIAGNOSIS — I442 Atrioventricular block, complete: Secondary | ICD-10-CM

## 2020-08-24 ENCOUNTER — Other Ambulatory Visit: Payer: Self-pay | Admitting: Endocrinology

## 2020-08-25 ENCOUNTER — Encounter: Payer: Self-pay | Admitting: Endocrinology

## 2020-08-25 LAB — CUP PACEART REMOTE DEVICE CHECK
Battery Impedance: 2124 Ohm
Battery Remaining Longevity: 34 mo
Battery Voltage: 2.76 V
Brady Statistic AP VP Percent: 10 %
Brady Statistic AP VS Percent: 0 %
Brady Statistic AS VP Percent: 90 %
Brady Statistic AS VS Percent: 0 %
Date Time Interrogation Session: 20220303083120
Implantable Lead Implant Date: 20110208
Implantable Lead Implant Date: 20110208
Implantable Lead Location: 753859
Implantable Lead Location: 753860
Implantable Lead Model: 5076
Implantable Lead Model: 5076
Implantable Pulse Generator Implant Date: 20110208
Lead Channel Impedance Value: 446 Ohm
Lead Channel Impedance Value: 541 Ohm
Lead Channel Pacing Threshold Amplitude: 0.375 V
Lead Channel Pacing Threshold Amplitude: 0.75 V
Lead Channel Pacing Threshold Pulse Width: 0.4 ms
Lead Channel Pacing Threshold Pulse Width: 0.4 ms
Lead Channel Setting Pacing Amplitude: 1.5 V
Lead Channel Setting Pacing Amplitude: 2.25 V
Lead Channel Setting Pacing Pulse Width: 0.4 ms
Lead Channel Setting Sensing Sensitivity: 2 mV

## 2020-09-01 NOTE — Progress Notes (Signed)
Remote pacemaker transmission.   

## 2020-09-08 DIAGNOSIS — Z1331 Encounter for screening for depression: Secondary | ICD-10-CM | POA: Diagnosis not present

## 2020-09-08 DIAGNOSIS — E663 Overweight: Secondary | ICD-10-CM | POA: Diagnosis not present

## 2020-09-08 DIAGNOSIS — I447 Left bundle-branch block, unspecified: Secondary | ICD-10-CM | POA: Diagnosis not present

## 2020-09-08 DIAGNOSIS — K121 Other forms of stomatitis: Secondary | ICD-10-CM | POA: Diagnosis not present

## 2020-09-08 DIAGNOSIS — I480 Paroxysmal atrial fibrillation: Secondary | ICD-10-CM | POA: Diagnosis not present

## 2020-09-08 DIAGNOSIS — Z6826 Body mass index (BMI) 26.0-26.9, adult: Secondary | ICD-10-CM | POA: Diagnosis not present

## 2020-11-20 ENCOUNTER — Ambulatory Visit (INDEPENDENT_AMBULATORY_CARE_PROVIDER_SITE_OTHER): Payer: Medicare HMO

## 2020-11-20 DIAGNOSIS — I442 Atrioventricular block, complete: Secondary | ICD-10-CM | POA: Diagnosis not present

## 2020-11-20 LAB — CUP PACEART REMOTE DEVICE CHECK
Battery Impedance: 2112 Ohm
Battery Remaining Longevity: 34 mo
Battery Voltage: 2.75 V
Brady Statistic AP VP Percent: 9 %
Brady Statistic AP VS Percent: 0 %
Brady Statistic AS VP Percent: 90 %
Brady Statistic AS VS Percent: 0 %
Date Time Interrogation Session: 20220602082005
Implantable Lead Implant Date: 20110208
Implantable Lead Implant Date: 20110208
Implantable Lead Location: 753859
Implantable Lead Location: 753860
Implantable Lead Model: 5076
Implantable Lead Model: 5076
Implantable Pulse Generator Implant Date: 20110208
Lead Channel Impedance Value: 445 Ohm
Lead Channel Impedance Value: 539 Ohm
Lead Channel Pacing Threshold Amplitude: 0.375 V
Lead Channel Pacing Threshold Amplitude: 0.875 V
Lead Channel Pacing Threshold Pulse Width: 0.4 ms
Lead Channel Pacing Threshold Pulse Width: 0.4 ms
Lead Channel Setting Pacing Amplitude: 1.5 V
Lead Channel Setting Pacing Amplitude: 2.25 V
Lead Channel Setting Pacing Pulse Width: 0.4 ms
Lead Channel Setting Sensing Sensitivity: 2 mV

## 2020-11-25 ENCOUNTER — Other Ambulatory Visit: Payer: Self-pay | Admitting: Cardiovascular Disease

## 2020-11-26 ENCOUNTER — Other Ambulatory Visit: Payer: Self-pay | Admitting: Cardiovascular Disease

## 2020-11-26 NOTE — Telephone Encounter (Signed)
51f, 62.6kg, scr 0.81 04/28/20, lovw/croitpru 03/31/20, ccr 63.9

## 2020-12-09 ENCOUNTER — Ambulatory Visit (INDEPENDENT_AMBULATORY_CARE_PROVIDER_SITE_OTHER): Payer: Medicare HMO | Admitting: Endocrinology

## 2020-12-09 ENCOUNTER — Encounter: Payer: Self-pay | Admitting: Endocrinology

## 2020-12-09 ENCOUNTER — Other Ambulatory Visit: Payer: Self-pay

## 2020-12-09 VITALS — BP 142/88 | HR 86 | Ht 61.0 in | Wt 141.0 lb

## 2020-12-09 DIAGNOSIS — E0590 Thyrotoxicosis, unspecified without thyrotoxic crisis or storm: Secondary | ICD-10-CM

## 2020-12-09 LAB — T4, FREE: Free T4: 0.94 ng/dL (ref 0.60–1.60)

## 2020-12-09 LAB — TSH: TSH: 3.77 u[IU]/mL (ref 0.35–4.50)

## 2020-12-09 NOTE — Progress Notes (Signed)
Subjective:    Patient ID: [name], female    DOB: [birth year], 70 y.o.   MRN: [id number]  HPI Pt returns for f/u of hyperthyroidism (dx'ed 2017; she has never had thyroid imaging, but Grave's Dz is suggested by physical exam; she declines RAI).  Since takes tapazole, 5 mg, just 1 per week.  pt states she feels well in general.   Past Medical History:  Diagnosis Date   Arthritis    Atrial tachycardia ([place])    Atypical chest pain    Bundle branch block left    Dr. Rollene Fare is cardiologist   Elevated cholesterol    Hypertension    Osteopenia 01/2019   T score -1.8   Pacemaker    Paroxysmal atrial fibrillation ([place])    rrare episodes on pacemaker interrogation   Wears glasses     Past Surgical History:  Procedure Laterality Date   CARDIAC CATHETERIZATION  2005   Ejection fraction is estimated at  greater than or equal to 60 %  Wall motion is normal.   CAROTID DUPLEX BILATERAL  march 08,2005   Impression : No evidence of of signficant plaque formation or stenosis.   CARPAL TUNNEL RELEASE  04/30/2011   Procedure: CARPAL TUNNEL RELEASE;  Surgeon: Cammie Sickle., MD;  Location: [facility];  Service: Orthopedics;  Laterality: Left;  left carpal tunnel release, inject left thumb   CARPAL TUNNEL RELEASE  04/28/2012   Procedure: CARPAL TUNNEL RELEASE;  Surgeon: Cammie Sickle., MD;  Location: [facility];  Service: Orthopedics;  Laterality: Right;   COLONOSCOPY     COLONOSCOPY  04/14/2012   Procedure: COLONOSCOPY;  Surgeon: Daneil Dolin, MD;  [facility];  Service: Endoscopy;  Laterality: N/A;  8:30 AM   DORSAL COMPARTMENT RELEASE  04/30/2011   Procedure: RELEASE DORSAL COMPARTMENT (DEQUERVAIN);  Surgeon: Cammie Sickle., MD;  Location: [facility];  Service: Orthopedics;  Laterality: Left;  release 1st dorsal compartment on left   INSERT / REPLACE / REMOVE PACEMAKER  07/29/2009   PPM-Dual Chamber Systems Fluoro  Guidance Venogram performed 10 ml 's to the left subclavian vein   PACEMAKER INSERTION  07/2009   Dr. Rollene Fare  Pacemaker is a Adapta L implanted 07/2009   RENAL CIRCULATION  07/ 08/ 08   normal renal duplex doppler    TRANSTHORACIC ECHOCARDIOGRAM  10/18/2012   Atrial tracking & V paced rhythm with pacer induced LBBB. No significant intra -ventricular  dyssynchrony.   TUBAL LIGATION  1986    Social History   Socioeconomic History   Marital status: Married    Spouse name: Not on file   Number of children: Not on file   Years of education: Not on file   Highest education level: Not on file  Occupational History   Not on file  Tobacco Use   Smoking status: Former    Packs/day: 1.50    Years: 30.00    Pack years: 45.00    Types: Cigarettes   Smokeless tobacco: Never  Vaping Use   Vaping Use: Never used  Substance and Sexual Activity   Alcohol use: Yes    Alcohol/week: 7.0 standard drinks    Types: 7 Glasses of wine per week   Drug use: No   Sexual activity: Not Currently    Birth control/protection: Post-menopausal, Surgical    Comment: Tubal lig-1st intercourse 70 yo-Fewer than 5 partners  Other Topics Concern   Not  on file  Social History Narrative   Not on file   Social Determinants of Health   Financial Resource Strain: Not on file  Food Insecurity: Not on file  Transportation Needs: Not on file  Physical Activity: Not on file  Stress: Not on file  Social Connections: Not on file  Intimate Partner Violence: Not on file    Current Outpatient Medications on File Prior to Visit  Medication Sig Dispense Refill   acetaminophen (TYLENOL) 650 MG CR tablet Take 650 mg by mouth every 8 (eight) hours as needed for pain.     Calcium Carb-Cholecalciferol (CALCIUM-VITAMIN D) 600-400 MG-UNIT TABS Take 2 tablets by mouth 2 (two) times daily.      lisinopril (ZESTRIL) 20 MG tablet Take 20 mg by mouth daily.     lisinopril-hydrochlorothiazide (ZESTORETIC) 20-12.5 MG tablet  Take 1 tablet by mouth daily.     meloxicam (MOBIC) 15 MG tablet Take 15 mg by mouth daily.     methimazole (TAPAZOLE) 5 MG tablet TAKE 1 TABLET BY MOUTH THREE TIMES A WEEK 36 tablet 3   metoprolol succinate (TOPROL-XL) 50 MG 24 hr tablet Take 1 tablet (50 mg total) by mouth daily. Take with or immediately following a meal. 90 tablet 2   Multiple Vitamins-Minerals (WOMENS 50+ MULTI VITAMIN/MIN PO) Take 1 tablet by mouth daily.     OVER THE COUNTER MEDICATION Take 1 tablet by mouth daily. Med Name: Mega Red fish oil,krill oil - JOINT CARE     VITAMIN D PO Take by mouth.     XARELTO 20 MG TABS tablet TAKE 1 TABLET BY MOUTH ONCE DAILY WITH SUPPER 90 tablet 1   rosuvastatin (CRESTOR) 10 MG tablet Take 1 tablet (10 mg total) by mouth daily. 90 tablet 3   No current facility-administered medications on file prior to visit.    Allergies  Allergen Reactions   Vicodin [Hydrocodone-Acetaminophen] Nausea Only   Atorvastatin Other (See Comments)    Muscle/joint pain    Family History  Problem Relation Age of Onset   Heart disease Mother    Diabetes Father    Heart disease Paternal Grandfather    Colon cancer Maternal Aunt    Thyroid disease Neg Hx     BP (!) 142/88   Pulse 86   Ht 5\' 1"  (1.549 m)   Wt 141 lb (64 kg)   SpO2 97%   BMI 26.64 kg/m    Review of Systems Denies fever.      Objective:   Physical Exam NECK: There is no palpable thyroid enlargement.  No thyroid nodule is palpable.  No palpable lymphadenopathy at the anterior neck.     Lab Results  Component Value Date   TSH 3.77 12/09/2020       Assessment & Plan:  Hyperthyroidism: well-controlled.  Please continue the same methimazole.

## 2020-12-09 NOTE — Patient Instructions (Signed)
blood tests are requested for you today.  We'll let you know about the results.  If ever you have fever while taking methimazole, stop it and call us, even if the reason is obvious, because of the risk of a rare side-effect.   It is best to never miss the medication.  However, if you do miss it, next best is to double up the next time.     Please come back for a follow-up appointment in 6 months.

## 2020-12-15 NOTE — Progress Notes (Signed)
Remote pacemaker transmission.   

## 2020-12-30 ENCOUNTER — Encounter: Payer: Medicare HMO | Admitting: Obstetrics and Gynecology

## 2020-12-30 ENCOUNTER — Encounter: Payer: Self-pay | Admitting: Obstetrics & Gynecology

## 2020-12-30 ENCOUNTER — Other Ambulatory Visit: Payer: Self-pay

## 2020-12-30 ENCOUNTER — Ambulatory Visit (INDEPENDENT_AMBULATORY_CARE_PROVIDER_SITE_OTHER): Payer: Medicare HMO | Admitting: Obstetrics & Gynecology

## 2020-12-30 VITALS — BP 116/70 | HR 91 | Resp 16 | Ht 60.25 in | Wt 139.0 lb

## 2020-12-30 DIAGNOSIS — Z780 Asymptomatic menopausal state: Secondary | ICD-10-CM

## 2020-12-30 DIAGNOSIS — M8580 Other specified disorders of bone density and structure, unspecified site: Secondary | ICD-10-CM

## 2020-12-30 DIAGNOSIS — Z01419 Encounter for gynecological examination (general) (routine) without abnormal findings: Secondary | ICD-10-CM | POA: Diagnosis not present

## 2020-12-30 NOTE — Progress Notes (Signed)
[name] 12-14-1950 [id number]   History:    70 y.o. G2P2L2  RP:  Established patient presenting for annual gyn exam   HPI:  Postmenopausal.  No significant hot flashes or night sweats.  No vaginal bleeding.  Pap test Negative 11/2019.  Breasts normal.  Colonoscopy 2013. Osteopenia.  DEXA 01/2019.  T score -1.8, stable BMD overall.  Taking vitamin D and calcium.  BMI 26.92.  Health labs with Fam MD.   Past medical history,surgical history, family history and social history were all reviewed and documented in the EPIC chart.  Gynecologic History No LMP recorded. Patient is postmenopausal.  Obstetric History OB History  Gravida Para Term Preterm AB Living  2 2 2    0 2  SAB IAB Ectopic Multiple Live Births               # Outcome Date GA Lbr Len/2nd Weight Sex Delivery Anes PTL Lv  2 Term           1 Term              ROS: A ROS was performed and pertinent positives and negatives are included in the history.  GENERAL: No fevers or chills. HEENT: No change in vision, no earache, sore throat or sinus congestion. NECK: No pain or stiffness. CARDIOVASCULAR: No chest pain or pressure. No palpitations. PULMONARY: No shortness of breath, cough or wheeze. GASTROINTESTINAL: No abdominal pain, nausea, vomiting or diarrhea, melena or bright red blood per rectum. GENITOURINARY: No urinary frequency, urgency, hesitancy or dysuria. MUSCULOSKELETAL: No joint or muscle pain, no back pain, no recent trauma. DERMATOLOGIC: No rash, no itching, no lesions. ENDOCRINE: No polyuria, polydipsia, no heat or cold intolerance. No recent change in weight. HEMATOLOGICAL: No anemia or easy bruising or bleeding. NEUROLOGIC: No headache, seizures, numbness, tingling or weakness. PSYCHIATRIC: No depression, no loss of interest in normal activity or change in sleep pattern.     Exam:   BP 116/70   Pulse 91   Resp 16   Ht 5' 0.25" (1.53 m)   Wt 139 lb (63 kg)   BMI 26.92 kg/m   Body mass index is 26.92  kg/m.  General appearance : Well developed well nourished female. No acute distress HEENT: Eyes: no retinal hemorrhage or exudates,  Neck supple, trachea midline, no carotid bruits, no thyroidmegaly Lungs: Clear to auscultation, no rhonchi or wheezes, or rib retractions  Heart: Regular rate and rhythm, no murmurs or gallops Breast:Examined in sitting and supine position were symmetrical in appearance, no palpable masses or tenderness,  no skin retraction, no nipple inversion, no nipple discharge, no skin discoloration, no axillary or supraclavicular lymphadenopathy Abdomen: no palpable masses or tenderness, no rebound or guarding Extremities: no edema or skin discoloration or tenderness  Pelvic: Vulva: Normal             Vagina: No gross lesions or discharge  Cervix: No gross lesions or discharge  Uterus  AV, normal size, shape and consistency, non-tender and mobile  Adnexa  Without masses or tenderness  Anus: Normal   Assessment/Plan:  70 y.o. female for annual exam   1. Well female exam with routine gynecological exam Normal gynecologic exam in menopause.  Pap test June 2021 was negative, no indication to repeat at this time.  Breast exam normal.  Screening mammogram November 2021 was negative.  Colonoscopy 2013.  Health labs with family physician.  Good body mass index of 26.92.  Continue with fitness and healthy  nutrition.  2. Postmenopause Well on no hormone replacement therapy.  No postmenopausal bleeding.  3. Osteopenia, unspecified location Osteopenia on bone density in 2020, will repeat this year.  Continue with vitamin D supplements, calcium intake of 1.5 g/day total and regular weightbearing physical activities. - DG Bone Density; Future  Other orders - escitalopram (LEXAPRO) 10 MG tablet; 5 mg once a week. - aspirin-acetaminophen-caffeine (HEADACHE RELIEF) 034-035-24 MG tablet   Princess Bruins MD, 1:44 PM 12/30/2020

## 2021-01-04 ENCOUNTER — Encounter: Payer: Self-pay | Admitting: Obstetrics & Gynecology

## 2021-02-19 DEATH — deceased

## 2021-02-26 ENCOUNTER — Other Ambulatory Visit: Payer: Self-pay | Admitting: Cardiovascular Disease

## 2021-03-02 ENCOUNTER — Ambulatory Visit (INDEPENDENT_AMBULATORY_CARE_PROVIDER_SITE_OTHER): Payer: Medicare HMO

## 2021-03-02 DIAGNOSIS — I442 Atrioventricular block, complete: Secondary | ICD-10-CM

## 2021-03-02 LAB — CUP PACEART REMOTE DEVICE CHECK
Battery Impedance: 2194 Ohm
Battery Remaining Longevity: 34 mo
Battery Voltage: 2.76 V
Brady Statistic AP VP Percent: 9 %
Brady Statistic AP VS Percent: 0 %
Brady Statistic AS VP Percent: 91 %
Brady Statistic AS VS Percent: 0 %
Date Time Interrogation Session: 20220912075620
Implantable Lead Implant Date: 20110208
Implantable Lead Implant Date: 20110208
Implantable Lead Location: 753859
Implantable Lead Location: 753860
Implantable Lead Model: 5076
Implantable Lead Model: 5076
Implantable Pulse Generator Implant Date: 20110208
Lead Channel Impedance Value: 474 Ohm
Lead Channel Impedance Value: 564 Ohm
Lead Channel Pacing Threshold Amplitude: 0.375 V
Lead Channel Pacing Threshold Amplitude: 0.875 V
Lead Channel Pacing Threshold Pulse Width: 0.4 ms
Lead Channel Pacing Threshold Pulse Width: 0.4 ms
Lead Channel Setting Pacing Amplitude: 1.5 V
Lead Channel Setting Pacing Amplitude: 2.25 V
Lead Channel Setting Pacing Pulse Width: 0.4 ms
Lead Channel Setting Sensing Sensitivity: 2 mV

## 2021-03-10 NOTE — Progress Notes (Signed)
Remote pacemaker transmission.   

## 2021-03-17 ENCOUNTER — Other Ambulatory Visit (HOSPITAL_COMMUNITY): Payer: Self-pay | Admitting: Obstetrics & Gynecology

## 2021-03-17 DIAGNOSIS — Z1231 Encounter for screening mammogram for malignant neoplasm of breast: Secondary | ICD-10-CM

## 2021-03-18 ENCOUNTER — Other Ambulatory Visit: Payer: Self-pay | Admitting: Obstetrics & Gynecology

## 2021-03-18 ENCOUNTER — Other Ambulatory Visit: Payer: Self-pay

## 2021-03-18 ENCOUNTER — Ambulatory Visit (INDEPENDENT_AMBULATORY_CARE_PROVIDER_SITE_OTHER): Payer: Medicare HMO

## 2021-03-18 DIAGNOSIS — Z780 Asymptomatic menopausal state: Secondary | ICD-10-CM

## 2021-03-18 DIAGNOSIS — M85851 Other specified disorders of bone density and structure, right thigh: Secondary | ICD-10-CM

## 2021-03-18 DIAGNOSIS — M85852 Other specified disorders of bone density and structure, left thigh: Secondary | ICD-10-CM | POA: Diagnosis not present

## 2021-03-18 DIAGNOSIS — M8580 Other specified disorders of bone density and structure, unspecified site: Secondary | ICD-10-CM

## 2021-03-31 DIAGNOSIS — Z23 Encounter for immunization: Secondary | ICD-10-CM | POA: Diagnosis not present

## 2021-04-06 ENCOUNTER — Encounter: Payer: Self-pay | Admitting: Cardiovascular Disease

## 2021-04-06 ENCOUNTER — Ambulatory Visit (INDEPENDENT_AMBULATORY_CARE_PROVIDER_SITE_OTHER): Payer: Medicare HMO | Admitting: Cardiovascular Disease

## 2021-04-06 ENCOUNTER — Other Ambulatory Visit: Payer: Self-pay

## 2021-04-06 VITALS — BP 150/78 | HR 60 | Ht 61.0 in | Wt 141.6 lb

## 2021-04-06 DIAGNOSIS — I10 Essential (primary) hypertension: Secondary | ICD-10-CM

## 2021-04-06 DIAGNOSIS — Z7901 Long term (current) use of anticoagulants: Secondary | ICD-10-CM | POA: Diagnosis not present

## 2021-04-06 DIAGNOSIS — I442 Atrioventricular block, complete: Secondary | ICD-10-CM | POA: Diagnosis not present

## 2021-04-06 DIAGNOSIS — I480 Paroxysmal atrial fibrillation: Secondary | ICD-10-CM | POA: Diagnosis not present

## 2021-04-06 DIAGNOSIS — E7800 Pure hypercholesterolemia, unspecified: Secondary | ICD-10-CM

## 2021-04-06 DIAGNOSIS — Z950 Presence of cardiac pacemaker: Secondary | ICD-10-CM | POA: Diagnosis not present

## 2021-04-06 NOTE — Patient Instructions (Signed)

## 2021-04-06 NOTE — Progress Notes (Signed)
Cardiology Office Note    Date:  04/07/2021   ID:  MIKELLA LINSLEY, DOB 03/13/1951, MRN [id number]  PCP:  Sharilyn Sites, MD  Cardiologist:  Quay Burow, MD: Sanda Klein, MD   Chief Complaint  Patient presents with   Follow-up  Pacemaker/complete heart block/atrial fibrillation  History of Present Illness:  [name] is a 70 y.o. female with complete heart block, paroxysmal atrial fibrillation, hypertension, hyperlipidemia returning for follow-up.  Her device was implanted for symptomatic bradycardia and she has a history of left bundle branch block. She subsequently has developed complete heart block and is now pacemaker dependent. Her Medtronic Adapta device was implanted in 2011.  Subsequent to the pacemaker implantation she developed episodes of infrequent paroxysmal atrial fibrillation that have been consistently asymptomatic.  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  The episodes of atrial fibrillation were not clearly associated with a thyroid disorder and have occurred even when she became euthyroid.  The patient specifically denies any chest pain at rest exertion, dyspnea at rest or with exertion, orthopnea, paroxysmal nocturnal dyspnea, syncope, palpitations, focal neurological deficits, intermittent claudication, lower extremity edema, unexplained weight gain, cough, hemoptysis or wheezing.   Based on current voltage there are 30 months of estimated generator longevity remaining. There is 9%  atrial pacing and 99.8% ventricular pacing. Since her last office pacemaker check the overall burden of atrial fibrillation remains low at 0.3 %.  As before, the episodes are quite infrequent, but can last for hours (most recently 6 hours in June).  She also had  rare episodes of nonsustained VT (22 beats, 9 seconds at 197 bpm, in March 2021), none recently.  She has well-controlled hypertension and takes a statin for hyperlipidemia,  with labs monitored via Dr. Hilma Favors. She had a normal nuclear perfusion stress test in May 2017  Past Medical History:  Diagnosis Date   Arthritis    Atrial tachycardia ([place])    Atypical chest pain    Bundle branch block left    Dr. Rollene Fare is cardiologist   Elevated cholesterol    Hypertension    Osteopenia 01/2019   T score -1.8   Pacemaker    Paroxysmal atrial fibrillation ([place])    rrare episodes on pacemaker interrogation   Wears glasses     Past Surgical History:  Procedure Laterality Date   CARDIAC CATHETERIZATION  2005   Ejection fraction is estimated at  greater than or equal to 60 %  Wall motion is normal.   CAROTID DUPLEX BILATERAL  march 08,2005   Impression : No evidence of of signficant plaque formation or stenosis.   CARPAL TUNNEL RELEASE  04/30/2011   Procedure: CARPAL TUNNEL RELEASE;  Surgeon: Cammie Sickle., MD;  Location: [place];  Service: Orthopedics;  Laterality: Left;  left carpal tunnel release, inject left thumb   CARPAL TUNNEL RELEASE  04/28/2012   Procedure: CARPAL TUNNEL RELEASE;  Surgeon: Cammie Sickle., MD;  Location: [place];  Service: Orthopedics;  Laterality: Right;   COLONOSCOPY     COLONOSCOPY  04/14/2012   Procedure: COLONOSCOPY;  Surgeon: Daneil Dolin, MD;  [facility];  Service: Endoscopy;  Laterality: N/A;  8:30 AM   DORSAL COMPARTMENT RELEASE  04/30/2011   Procedure: RELEASE DORSAL COMPARTMENT (DEQUERVAIN);  Surgeon: Cammie Sickle., MD;  Location: [facility];  Service: Orthopedics;  Laterality: Left;  release 1st dorsal compartment on left  INSERT / REPLACE / REMOVE PACEMAKER  07/29/2009   PPM-Dual Chamber Systems Fluoro Guidance Venogram performed 10 ml 's to the left subclavian vein   PACEMAKER INSERTION  07/2009   Dr. Rollene Fare  Pacemaker is a Adapta L implanted 07/2009   RENAL CIRCULATION  07/ 08/ 08   normal renal duplex doppler    TRANSTHORACIC  ECHOCARDIOGRAM  10/18/2012   Atrial tracking & V paced rhythm with pacer induced LBBB. No significant intra -ventricular  dyssynchrony.   TUBAL LIGATION  1986    Current Medications: Outpatient Medications Prior to Visit  Medication Sig Dispense Refill   acetaminophen (TYLENOL) 650 MG CR tablet Take 650 mg by mouth every 8 (eight) hours as needed for pain.     aspirin-acetaminophen-caffeine (HEADACHE RELIEF) 250-250-65 MG tablet      Calcium Carb-Cholecalciferol (CALCIUM-VITAMIN D) 600-400 MG-UNIT TABS Take 1 tablet by mouth 2 (two) times daily.     escitalopram (LEXAPRO) 10 MG tablet 5 mg. Twice weekly     lisinopril (ZESTRIL) 20 MG tablet Take 20 mg by mouth daily.     lisinopril-hydrochlorothiazide (ZESTORETIC) 20-12.5 MG tablet Take 1 tablet by mouth daily.     meloxicam (MOBIC) 15 MG tablet Take 15 mg by mouth as needed.     methimazole (TAPAZOLE) 5 MG tablet TAKE 1 TABLET BY MOUTH THREE TIMES A WEEK (Patient taking differently: once a week.) 36 tablet 3   metoprolol succinate (TOPROL-XL) 50 MG 24 hr tablet TAKE 1 TABLET BY MOUTH ONCE DAILY WITH  OR  IMMEDIATELY  FOLLOWING  A  MEAL 90 tablet 0   Multiple Vitamins-Minerals (WOMENS 50+ MULTI VITAMIN/MIN PO) Take 1 tablet by mouth daily.     OVER THE COUNTER MEDICATION Take 2 tablets by mouth daily. Med Name: Mega Red fish oil,krill oil - JOINT CARE     rosuvastatin (CRESTOR) 10 MG tablet Take 1 tablet (10 mg total) by mouth daily. 90 tablet 3   VITAMIN D PO Take by mouth.     XARELTO 20 MG TABS tablet TAKE 1 TABLET BY MOUTH ONCE DAILY WITH SUPPER 90 tablet 1   No facility-administered medications prior to visit.     Allergies:   Vicodin [hydrocodone-acetaminophen] and Atorvastatin   Social History   Socioeconomic History   Marital status: Married    Spouse name: Not on file   Number of children: Not on file   Years of education: Not on file   Highest education level: Not on file  Occupational History   Not on file  Tobacco  Use   Smoking status: Former    Packs/day: 1.50    Years: 30.00    Pack years: 45.00    Types: Cigarettes   Smokeless tobacco: Never  Vaping Use   Vaping Use: Never used  Substance and Sexual Activity   Alcohol use: Yes    Alcohol/week: 7.0 standard drinks    Types: 7 Glasses of wine per week   Drug use: No   Sexual activity: Not Currently    Birth control/protection: Post-menopausal, Surgical    Comment: Tubal lig-1st intercourse 70 yo-Fewer than 5 partners  Other Topics Concern   Not on file  Social History Narrative   Not on file   Social Determinants of Health   Financial Resource Strain: Not on file  Food Insecurity: Not on file  Transportation Needs: Not on file  Physical Activity: Not on file  Stress: Not on file  Social Connections: Not on file  Family History:  The patient's family history includes Colon cancer in her maternal aunt; Diabetes in her father; Heart disease in her mother and paternal grandfather.   ROS:   Please see the history of present illness.    ROS All other systems are reviewed and are negative.   PHYSICAL EXAM:   VS:  BP (!) 150/78 (BP Location: Left Arm, Patient Position: Sitting, Cuff Size: Normal)   Pulse 60   Ht 5\' 1"  (1.549 m)   Wt 141 lb 9.6 oz (64.2 kg)   SpO2 97%   BMI 26.76 kg/m      General: Alert, oriented x3, no distress, healthy PM scl site Head: no evidence of trauma, PERRL, EOMI, no exophtalmos or lid lag, no myxedema, no xanthelasma; normal ears, nose and oropharynx Neck: normal jugular venous pulsations and no hepatojugular reflux; brisk carotid pulses without delay and no carotid bruits Chest: clear to auscultation, no signs of consolidation by percussion or palpation, normal fremitus, symmetrical and full respiratory excursions Cardiovascular: normal position and quality of the apical impulse, regular rhythm, normal first and second heart sounds, no murmurs, rubs or gallops Abdomen: no tenderness or distention,  no masses by palpation, no abnormal pulsatility or arterial bruits, normal bowel sounds, no hepatosplenomegaly Extremities: no clubbing, cyanosis or edema; 2+ radial, ulnar and brachial pulses bilaterally; 2+ right femoral, posterior tibial and dorsalis pedis pulses; 2+ left femoral, posterior tibial and dorsalis pedis pulses; no subclavian or femoral bruits Neurological: grossly nonfocal Psych: Normal mood and affect    Wt Readings from Last 3 Encounters:  04/06/21 141 lb 9.6 oz (64.2 kg)  12/30/20 139 lb (63 kg)  12/09/20 141 lb (64 kg)      Studies/Labs Reviewed:   EKG: Ordered today shows AV sequential pacing  Recent Labs: 04/28/2020: ALT 29; BUN 14; Creatinine, Ser 0.81; Potassium 4.3; Sodium 143 12/09/2020: TSH 3.77   Lipid Panel    Component Value Date/Time   CHOL 187 04/28/2020 0813   TRIG 154 (H) 04/28/2020 0813   HDL 55 04/28/2020 0813   CHOLHDL 3.4 04/28/2020 0813   CHOLHDL 3.3 03/28/2015 0859   VLDL 27 03/28/2015 0859   LDLCALC 105 (H) 04/28/2020 0813    April 17, 2019 Total cholesterol 171, HDL 55, LDL 88, triglycerides 162 Hemoglobin A1c 5.5% Hemoglobin 14.4, creatinine 0.87, potassium 5.3, normal liver function tests TSH 3.32 on 12/18/2019 ASSESSMENT:    1. CHB (complete heart block) ([place])   2. Pacemaker   3. Paroxysmal atrial fibrillation ([facility])   4. Long term (current) use of anticoagulants   5. Essential hypertension   6. Hypercholesterolemia      PLAN:  In order of problems listed above:  CHB: No escape rhythm, pacemaker dependent PPM: Normal device function.  Remote downloads every 3 months and yearly office visits. AFib: Rare, but can be lengthy. Overall prevalence 0.3%. Asymptomatic. CHADVasc score of 3 (age, gender, HTN). Xarelto: No bleeding, no falls. Consider switching to Eliquis for lower GI bleeding potential, if this becomes an issue. HTN: controlled. HLP: followed by PCP.    Medication Adjustments/Labs and Tests  Ordered: Current medicines are reviewed at length with the patient today.  Concerns regarding medicines are outlined above.  Medication changes, Labs and Tests ordered today are listed in the Patient Instructions below. Patient Instructions  Medication Instructions:  No changes *If you need a refill on your cardiac medications before your next appointment, please call your pharmacy*   Lab Work: None ordered If you have labs (  blood work) drawn today and your tests are completely normal, you will receive your results only by: [place] (if you have MyChart) OR A paper copy in the mail If you have any lab test that is abnormal or we need to change your treatment, we will call you to review the results.   Testing/Procedures: None ordered   Follow-Up: At [facility], you and your health needs are our priority.  As part of our continuing mission to provide you with exceptional heart care, we have created designated Provider Care Teams.  These Care Teams include your primary Cardiologist (physician) and Advanced Practice Providers (APPs -  Physician Assistants and Nurse Practitioners) who all work together to provide you with the care you need, when you need it.  We recommend signing up for the patient portal called "MyChart".  Sign up information is provided on this After Visit Summary.  MyChart is used to connect with patients for Virtual Visits (Telemedicine).  Patients are able to view lab/test results, encounter notes, upcoming appointments, etc.  Non-urgent messages can be sent to your provider as well.   To learn more about what you can do with MyChart, go to [email or web address].    Your next appointment:   12 month(s)  The format for your next appointment:   In Person  Provider:   Sanda Klein, MD    Signed, Sanda Klein, MD  04/07/2021 7:03 PM    [address], [place], [place]  [address] Phone: [phone]; Fax: [phone]

## 2021-04-27 ENCOUNTER — Ambulatory Visit (HOSPITAL_COMMUNITY)
Admission: RE | Admit: 2021-04-27 | Discharge: 2021-04-27 | Disposition: A | Discharge status: Home / Self Care | Payer: Medicare HMO | Source: Ambulatory Visit | Attending: Obstetrics & Gynecology | Admitting: Obstetrics & Gynecology

## 2021-04-27 ENCOUNTER — Other Ambulatory Visit: Payer: Self-pay

## 2021-04-27 DIAGNOSIS — Z1231 Encounter for screening mammogram for malignant neoplasm of breast: Secondary | ICD-10-CM | POA: Diagnosis not present

## 2021-06-01 ENCOUNTER — Ambulatory Visit (INDEPENDENT_AMBULATORY_CARE_PROVIDER_SITE_OTHER): Payer: Medicare HMO

## 2021-06-01 DIAGNOSIS — I442 Atrioventricular block, complete: Secondary | ICD-10-CM | POA: Diagnosis not present

## 2021-06-01 LAB — CUP PACEART REMOTE DEVICE CHECK
Battery Impedance: 2610 Ohm
Battery Remaining Longevity: 29 mo
Battery Voltage: 2.75 V
Brady Statistic AP VP Percent: 10 %
Brady Statistic AP VS Percent: 0 %
Brady Statistic AS VP Percent: 90 %
Brady Statistic AS VS Percent: 0 %
Date Time Interrogation Session: 20221212075311
Implantable Lead Implant Date: 20110208
Implantable Lead Implant Date: 20110208
Implantable Lead Location: 753859
Implantable Lead Location: 753860
Implantable Lead Model: 5076
Implantable Lead Model: 5076
Implantable Pulse Generator Implant Date: 20110208
Lead Channel Impedance Value: 447 Ohm
Lead Channel Impedance Value: 572 Ohm
Lead Channel Pacing Threshold Amplitude: 0.375 V
Lead Channel Pacing Threshold Amplitude: 0.75 V
Lead Channel Pacing Threshold Pulse Width: 0.4 ms
Lead Channel Pacing Threshold Pulse Width: 0.4 ms
Lead Channel Setting Pacing Amplitude: 1.5 V
Lead Channel Setting Pacing Amplitude: 2.25 V
Lead Channel Setting Pacing Pulse Width: 0.4 ms
Lead Channel Setting Sensing Sensitivity: 2 mV

## 2021-06-02 ENCOUNTER — Other Ambulatory Visit: Payer: Self-pay

## 2021-06-02 ENCOUNTER — Ambulatory Visit: Payer: Medicare HMO | Admitting: Endocrinology

## 2021-06-02 VITALS — BP 154/80 | HR 65 | Ht 61.0 in | Wt 139.4 lb

## 2021-06-02 DIAGNOSIS — E0590 Thyrotoxicosis, unspecified without thyrotoxic crisis or storm: Secondary | ICD-10-CM

## 2021-06-02 LAB — TSH: TSH: 2.64 u[IU]/mL (ref 0.35–5.50)

## 2021-06-02 LAB — T4, FREE: Free T4: 0.81 ng/dL (ref 0.60–1.60)

## 2021-06-02 MED ORDER — METHIMAZOLE 5 MG PO TABS: 5.0000 mg | ORAL_TABLET | ORAL | 3 refills | Status: DC

## 2021-06-02 NOTE — Progress Notes (Signed)
Subjective:    Patient ID: [name], female    DOB: 1951/05/10, 70 y.o.   MRN: [id number]  HPI Pt returns for f/u of hyperthyroidism (dx'ed 2017; she has never had thyroid imaging, but Grave's Dz is suggested by physical exam; she declines RAI).  Since takes tapazole, 5 mg, just 1 per week.  pt states she feels well in general.   Past Medical History:  Diagnosis Date   Arthritis    Atrial tachycardia ([place])    Atypical chest pain    Bundle branch block left    Dr. Rollene Fare is cardiologist   Elevated cholesterol    Hypertension    Osteopenia 01/2019   T score -1.8   Pacemaker    Paroxysmal atrial fibrillation ([place])    rrare episodes on pacemaker interrogation   Wears glasses     Past Surgical History:  Procedure Laterality Date   CARDIAC CATHETERIZATION  2005   Ejection fraction is estimated at  greater than or equal to 60 %  Wall motion is normal.   CAROTID DUPLEX BILATERAL  march 08,2005   Impression : No evidence of of signficant plaque formation or stenosis.   CARPAL TUNNEL RELEASE  04/30/2011   Procedure: CARPAL TUNNEL RELEASE;  Surgeon: Cammie Sickle., MD;  Location: [place];  Service: Orthopedics;  Laterality: Left;  left carpal tunnel release, inject left thumb   CARPAL TUNNEL RELEASE  04/28/2012   Procedure: CARPAL TUNNEL RELEASE;  Surgeon: Cammie Sickle., MD;  Location: [place];  Service: Orthopedics;  Laterality: Right;   COLONOSCOPY     COLONOSCOPY  04/14/2012   Procedure: COLONOSCOPY;  Surgeon: Daneil Dolin, MD;  [facility];  Service: Endoscopy;  Laterality: N/A;  8:30 AM   DORSAL COMPARTMENT RELEASE  04/30/2011   Procedure: RELEASE DORSAL COMPARTMENT (DEQUERVAIN);  Surgeon: Cammie Sickle., MD;  [facility];  Service: Orthopedics;  Laterality: Left;  release 1st dorsal compartment on left   INSERT / REPLACE / REMOVE PACEMAKER  07/29/2009   PPM-Dual Chamber Systems Fluoro  Guidance Venogram performed 10 ml 's to the left subclavian vein   PACEMAKER INSERTION  07/2009   Dr. Rollene Fare  Pacemaker is a Adapta L implanted 07/2009   RENAL CIRCULATION  07/ 08/ 08   normal renal duplex doppler    TRANSTHORACIC ECHOCARDIOGRAM  10/18/2012   Atrial tracking & V paced rhythm with pacer induced LBBB. No significant intra -ventricular  dyssynchrony.   TUBAL LIGATION  1986    Social History   Socioeconomic History   Marital status: Married    Spouse name: Not on file   Number of children: Not on file   Years of education: Not on file   Highest education level: Not on file  Occupational History   Not on file  Tobacco Use   Smoking status: Former    Packs/day: 1.50    Years: 30.00    Pack years: 45.00    Types: Cigarettes   Smokeless tobacco: Never  Vaping Use   Vaping Use: Never used  Substance and Sexual Activity   Alcohol use: Yes    Alcohol/week: 7.0 standard drinks    Types: 7 Glasses of wine per week   Drug use: No   Sexual activity: Not Currently    Birth control/protection: Post-menopausal, Surgical    Comment: Tubal lig-1st intercourse 70 yo-Fewer than 5 partners  Other Topics Concern   Not  on file  Social History Narrative   Not on file   Social Determinants of Health   Financial Resource Strain: Not on file  Food Insecurity: Not on file  Transportation Needs: Not on file  Physical Activity: Not on file  Stress: Not on file  Social Connections: Not on file  Intimate Partner Violence: Not on file    Current Outpatient Medications on File Prior to Visit  Medication Sig Dispense Refill   acetaminophen (TYLENOL) 650 MG CR tablet Take 650 mg by mouth every 8 (eight) hours as needed for pain.     aspirin-acetaminophen-caffeine (HEADACHE RELIEF) 250-250-65 MG tablet      Calcium Carb-Cholecalciferol (CALCIUM-VITAMIN D) 600-400 MG-UNIT TABS Take 1 tablet by mouth 2 (two) times daily.     escitalopram (LEXAPRO) 10 MG tablet 5 mg. Twice weekly      lisinopril (ZESTRIL) 20 MG tablet Take 20 mg by mouth daily.     lisinopril-hydrochlorothiazide (ZESTORETIC) 20-12.5 MG tablet Take 1 tablet by mouth daily.     meloxicam (MOBIC) 15 MG tablet Take 15 mg by mouth as needed.     metoprolol succinate (TOPROL-XL) 50 MG 24 hr tablet TAKE 1 TABLET BY MOUTH ONCE DAILY WITH  OR  IMMEDIATELY  FOLLOWING  A  MEAL 90 tablet 0   Multiple Vitamins-Minerals (WOMENS 50+ MULTI VITAMIN/MIN PO) Take 1 tablet by mouth daily.     OVER THE COUNTER MEDICATION Take 2 tablets by mouth daily. Med Name: Mega Red fish oil,krill oil - JOINT CARE     VITAMIN D PO Take by mouth.     XARELTO 20 MG TABS tablet TAKE 1 TABLET BY MOUTH ONCE DAILY WITH SUPPER 90 tablet 1   rosuvastatin (CRESTOR) 10 MG tablet Take 1 tablet (10 mg total) by mouth daily. 90 tablet 3   No current facility-administered medications on file prior to visit.    Allergies  Allergen Reactions   Vicodin [Hydrocodone-Acetaminophen] Nausea Only   Atorvastatin Other (See Comments)    Muscle/joint pain    Family History  Problem Relation Age of Onset   Heart disease Mother    Diabetes Father    Heart disease Paternal Grandfather    Colon cancer Maternal Aunt    Thyroid disease Neg Hx     BP (!) 154/80    Pulse 65    Ht 5\' 1"  (1.549 m)    Wt 139 lb 6.4 oz (63.2 kg)    SpO2 96%    BMI 26.34 kg/m    Review of Systems Denies fever.      Objective:   Physical Exam NECK: There is no palpable thyroid enlargement.  No thyroid nodule is palpable.  No palpable lymphadenopathy at the anterior neck.    Lab Results  Component Value Date   TSH 2.64 06/02/2021      Assessment & Plan:  Hyperthyroidism: well-controlled.  Please continue the same Tapazole

## 2021-06-02 NOTE — Patient Instructions (Addendum)
Your blood pressure is high today.  Please see your primary care provider, to have it rechecked blood tests are requested for you today.  We'll let you know about the results.  If ever you have fever while taking methimazole, stop it and call us, even if the reason is obvious, because of the risk of a rare side-effect.   It is best to never miss the medication.  However, if you do miss it, next best is to double up the next time.    Please come back for a follow-up appointment in 6 months.

## 2021-06-04 DIAGNOSIS — L821 Other seborrheic keratosis: Secondary | ICD-10-CM | POA: Diagnosis not present

## 2021-06-04 DIAGNOSIS — L812 Freckles: Secondary | ICD-10-CM | POA: Diagnosis not present

## 2021-06-04 DIAGNOSIS — D225 Melanocytic nevi of trunk: Secondary | ICD-10-CM | POA: Diagnosis not present

## 2021-06-04 DIAGNOSIS — L814 Other melanin hyperpigmentation: Secondary | ICD-10-CM | POA: Diagnosis not present

## 2021-06-10 NOTE — Progress Notes (Signed)
Remote pacemaker transmission.   

## 2021-07-08 DIAGNOSIS — Z6826 Body mass index (BMI) 26.0-26.9, adult: Secondary | ICD-10-CM | POA: Diagnosis not present

## 2021-07-08 DIAGNOSIS — E782 Mixed hyperlipidemia: Secondary | ICD-10-CM | POA: Diagnosis not present

## 2021-07-08 DIAGNOSIS — E663 Overweight: Secondary | ICD-10-CM | POA: Diagnosis not present

## 2021-07-08 DIAGNOSIS — E0590 Thyrotoxicosis, unspecified without thyrotoxic crisis or storm: Secondary | ICD-10-CM | POA: Diagnosis not present

## 2021-07-08 DIAGNOSIS — M1991 Primary osteoarthritis, unspecified site: Secondary | ICD-10-CM | POA: Diagnosis not present

## 2021-07-08 DIAGNOSIS — Z1331 Encounter for screening for depression: Secondary | ICD-10-CM | POA: Diagnosis not present

## 2021-07-08 DIAGNOSIS — R7309 Other abnormal glucose: Secondary | ICD-10-CM | POA: Diagnosis not present

## 2021-07-08 DIAGNOSIS — M810 Age-related osteoporosis without current pathological fracture: Secondary | ICD-10-CM | POA: Diagnosis not present

## 2021-07-08 DIAGNOSIS — I447 Left bundle-branch block, unspecified: Secondary | ICD-10-CM | POA: Diagnosis not present

## 2021-07-08 DIAGNOSIS — I10 Essential (primary) hypertension: Secondary | ICD-10-CM | POA: Diagnosis not present

## 2021-07-08 DIAGNOSIS — Z0000 Encounter for general adult medical examination without abnormal findings: Secondary | ICD-10-CM | POA: Diagnosis not present

## 2021-07-10 DIAGNOSIS — E559 Vitamin D deficiency, unspecified: Secondary | ICD-10-CM | POA: Diagnosis not present

## 2021-07-10 DIAGNOSIS — I10 Essential (primary) hypertension: Secondary | ICD-10-CM | POA: Diagnosis not present

## 2021-07-10 DIAGNOSIS — I480 Paroxysmal atrial fibrillation: Secondary | ICD-10-CM | POA: Diagnosis not present

## 2021-07-10 DIAGNOSIS — E0590 Thyrotoxicosis, unspecified without thyrotoxic crisis or storm: Secondary | ICD-10-CM | POA: Diagnosis not present

## 2021-07-10 DIAGNOSIS — E7849 Other hyperlipidemia: Secondary | ICD-10-CM | POA: Diagnosis not present

## 2021-08-05 ENCOUNTER — Other Ambulatory Visit: Payer: Self-pay | Admitting: Cardiovascular Disease

## 2021-08-10 ENCOUNTER — Other Ambulatory Visit: Payer: Self-pay | Admitting: Cardiovascular Disease

## 2021-08-12 DIAGNOSIS — M1811 Unilateral primary osteoarthritis of first carpometacarpal joint, right hand: Secondary | ICD-10-CM | POA: Diagnosis not present

## 2021-08-12 DIAGNOSIS — M19041 Primary osteoarthritis, right hand: Secondary | ICD-10-CM | POA: Diagnosis not present

## 2021-08-12 DIAGNOSIS — M65341 Trigger finger, right ring finger: Secondary | ICD-10-CM | POA: Diagnosis not present

## 2021-08-25 DIAGNOSIS — H25813 Combined forms of age-related cataract, bilateral: Secondary | ICD-10-CM | POA: Diagnosis not present

## 2021-08-31 ENCOUNTER — Ambulatory Visit (INDEPENDENT_AMBULATORY_CARE_PROVIDER_SITE_OTHER): Payer: Medicare HMO

## 2021-08-31 DIAGNOSIS — I442 Atrioventricular block, complete: Secondary | ICD-10-CM

## 2021-08-31 LAB — CUP PACEART REMOTE DEVICE CHECK
Battery Impedance: 2849 Ohm
Battery Remaining Longevity: 26 mo
Battery Voltage: 2.74 V
Brady Statistic AP VP Percent: 8 %
Brady Statistic AP VS Percent: 0 %
Brady Statistic AS VP Percent: 92 %
Brady Statistic AS VS Percent: 0 %
Date Time Interrogation Session: 20230313144557
Implantable Lead Implant Date: 20110208
Implantable Lead Implant Date: 20110208
Implantable Lead Location: 753859
Implantable Lead Location: 753860
Implantable Lead Model: 5076
Implantable Lead Model: 5076
Implantable Pulse Generator Implant Date: 20110208
Lead Channel Impedance Value: 442 Ohm
Lead Channel Impedance Value: 568 Ohm
Lead Channel Pacing Threshold Amplitude: 0.375 V
Lead Channel Pacing Threshold Amplitude: 0.75 V
Lead Channel Pacing Threshold Pulse Width: 0.4 ms
Lead Channel Pacing Threshold Pulse Width: 0.4 ms
Lead Channel Setting Pacing Amplitude: 1.5 V
Lead Channel Setting Pacing Amplitude: 2.25 V
Lead Channel Setting Pacing Pulse Width: 0.4 ms
Lead Channel Setting Sensing Sensitivity: 2 mV

## 2021-09-01 DIAGNOSIS — R609 Edema, unspecified: Secondary | ICD-10-CM | POA: Diagnosis not present

## 2021-09-01 DIAGNOSIS — R52 Pain, unspecified: Secondary | ICD-10-CM | POA: Diagnosis not present

## 2021-09-01 DIAGNOSIS — M19041 Primary osteoarthritis, right hand: Secondary | ICD-10-CM | POA: Diagnosis not present

## 2021-09-01 DIAGNOSIS — M65341 Trigger finger, right ring finger: Secondary | ICD-10-CM | POA: Diagnosis not present

## 2021-09-11 NOTE — Progress Notes (Signed)
Remote pacemaker transmission.   

## 2021-10-21 DIAGNOSIS — J209 Acute bronchitis, unspecified: Secondary | ICD-10-CM | POA: Diagnosis not present

## 2021-10-21 DIAGNOSIS — E663 Overweight: Secondary | ICD-10-CM | POA: Diagnosis not present

## 2021-10-21 DIAGNOSIS — J40 Bronchitis, not specified as acute or chronic: Secondary | ICD-10-CM | POA: Diagnosis not present

## 2021-10-21 DIAGNOSIS — Z6827 Body mass index (BMI) 27.0-27.9, adult: Secondary | ICD-10-CM | POA: Diagnosis not present

## 2021-10-30 ENCOUNTER — Ambulatory Visit (HOSPITAL_COMMUNITY)
Admission: RE | Admit: 2021-10-30 | Discharge: 2021-10-30 | Disposition: A | Discharge status: Home / Self Care | Payer: Medicare HMO | Source: Ambulatory Visit | Attending: Family Medicine | Admitting: Family Medicine

## 2021-10-30 ENCOUNTER — Other Ambulatory Visit (HOSPITAL_COMMUNITY): Payer: Self-pay | Admitting: Family Medicine

## 2021-10-30 DIAGNOSIS — J40 Bronchitis, not specified as acute or chronic: Secondary | ICD-10-CM | POA: Insufficient documentation

## 2021-10-30 DIAGNOSIS — S20229A Contusion of unspecified back wall of thorax, initial encounter: Secondary | ICD-10-CM | POA: Diagnosis not present

## 2021-10-30 DIAGNOSIS — Z6827 Body mass index (BMI) 27.0-27.9, adult: Secondary | ICD-10-CM | POA: Diagnosis not present

## 2021-10-30 DIAGNOSIS — E663 Overweight: Secondary | ICD-10-CM | POA: Diagnosis not present

## 2021-10-30 DIAGNOSIS — R059 Cough, unspecified: Secondary | ICD-10-CM | POA: Diagnosis not present

## 2021-11-04 ENCOUNTER — Ambulatory Visit: Payer: Medicare HMO | Admitting: Endocrinology

## 2021-12-07 ENCOUNTER — Ambulatory Visit (INDEPENDENT_AMBULATORY_CARE_PROVIDER_SITE_OTHER): Payer: Medicare HMO

## 2021-12-07 DIAGNOSIS — I442 Atrioventricular block, complete: Secondary | ICD-10-CM

## 2021-12-10 ENCOUNTER — Encounter: Payer: Self-pay | Admitting: Cardiovascular Disease

## 2021-12-10 LAB — CUP PACEART REMOTE DEVICE CHECK
Battery Impedance: 3314 Ohm
Battery Remaining Longevity: 21 mo
Battery Voltage: 2.72 V
Brady Statistic AP VP Percent: 8 %
Brady Statistic AP VS Percent: 0 %
Brady Statistic AS VP Percent: 92 %
Brady Statistic AS VS Percent: 0 %
Date Time Interrogation Session: 20230619061521
Implantable Lead Implant Date: 20110208
Implantable Lead Implant Date: 20110208
Implantable Lead Location: 753859
Implantable Lead Location: 753860
Implantable Lead Model: 5076
Implantable Lead Model: 5076
Implantable Pulse Generator Implant Date: 20110208
Lead Channel Impedance Value: 444 Ohm
Lead Channel Impedance Value: 562 Ohm
Lead Channel Pacing Threshold Amplitude: 0.375 V
Lead Channel Pacing Threshold Amplitude: 0.75 V
Lead Channel Pacing Threshold Pulse Width: 0.4 ms
Lead Channel Pacing Threshold Pulse Width: 0.4 ms
Lead Channel Setting Pacing Amplitude: 1.5 V
Lead Channel Setting Pacing Amplitude: 2.25 V
Lead Channel Setting Pacing Pulse Width: 0.4 ms
Lead Channel Setting Sensing Sensitivity: 2 mV

## 2021-12-14 ENCOUNTER — Other Ambulatory Visit: Payer: Self-pay | Admitting: Cardiovascular Disease

## 2021-12-15 ENCOUNTER — Ambulatory Visit: Payer: Medicare HMO | Admitting: Endocrinology

## 2021-12-17 ENCOUNTER — Ambulatory Visit: Payer: Medicare HMO | Admitting: Internal Medicine

## 2021-12-17 ENCOUNTER — Encounter: Payer: Self-pay | Admitting: Internal Medicine

## 2021-12-17 VITALS — BP 112/74 | HR 74 | Ht 61.0 in | Wt 145.6 lb

## 2021-12-17 DIAGNOSIS — E0590 Thyrotoxicosis, unspecified without thyrotoxic crisis or storm: Secondary | ICD-10-CM | POA: Diagnosis not present

## 2021-12-17 LAB — T4, FREE: Free T4: 0.93 ng/dL (ref 0.60–1.60)

## 2021-12-17 LAB — TSH: TSH: 2.51 u[IU]/mL (ref 0.35–5.50)

## 2021-12-17 NOTE — Progress Notes (Signed)
Name: TENNILE STYLES  MRN/ DOB: [id number], [birth year]    Age/ Sex: 71 y.o., female     PCP: Sharilyn Sites, MD   Reason for Endocrinology Evaluation: Hyperthyroidism     Initial Endocrinology Clinic Visit: 04/14/2016    PATIENT IDENTIFIER: [name] is a 71 y.o., female with a past medical history of HTN, PAF and hyperhtyoidism. She has followed with [facility] since 04/14/2016 for consultative assistance with management of her Hyperthyroidism.   HISTORICAL SUMMARY: The patient was first diagnosed with Hyperthyroidism in 2017. She had declined RAI through her previous endocrinoloogist   She has been on Methimazole for years.  No Amiodarone intake     No FH of thyroid disease  SUBJECTIVE:    Today (12/17/2021):  [name] is here for hyperthyroidism.   She stopped Methimazole a couple of months ago   Denies weight loss Has occasional loose stools that she attributes to traveling  Has occasional palpitations due to A. Fib  Denies tremors       HISTORY:  Past Medical History:  Past Medical History:  Diagnosis Date   Arthritis    Atrial tachycardia ([place])    Atypical chest pain    Bundle branch block left    Dr. Rollene Fare is cardiologist   Elevated cholesterol    Hypertension    Osteopenia 01/2019   T score -1.8   Pacemaker    Paroxysmal atrial fibrillation ([place])    rrare episodes on pacemaker interrogation   Wears glasses    Past Surgical History:  Past Surgical History:  Procedure Laterality Date   CARDIAC CATHETERIZATION  2005   Ejection fraction is estimated at  greater than or equal to 60 %  Wall motion is normal.   CAROTID DUPLEX BILATERAL  march 08,2005   Impression : No evidence of of signficant plaque formation or stenosis.   CARPAL TUNNEL RELEASE  04/30/2011   Procedure: CARPAL TUNNEL RELEASE;  Surgeon: Cammie Sickle., MD;  Location: [place];  Service: Orthopedics;  Laterality: Left;  left carpal  tunnel release, inject left thumb   CARPAL TUNNEL RELEASE  04/28/2012   Procedure: CARPAL TUNNEL RELEASE;  Surgeon: Cammie Sickle., MD;  Location: [place];  Service: Orthopedics;  Laterality: Right;   COLONOSCOPY     COLONOSCOPY  04/14/2012   Procedure: COLONOSCOPY;  Surgeon: Daneil Dolin, MD;  [facility];  Service: Endoscopy;  Laterality: N/A;  8:30 AM   DORSAL COMPARTMENT RELEASE  04/30/2011   Procedure: RELEASE DORSAL COMPARTMENT (DEQUERVAIN);  Surgeon: Cammie Sickle., MD;  Location: [facility];  Service: Orthopedics;  Laterality: Left;  release 1st dorsal compartment on left   INSERT / REPLACE / REMOVE PACEMAKER  07/29/2009   PPM-Dual Chamber Systems Fluoro Guidance Venogram performed 10 ml 's to the left subclavian vein   PACEMAKER INSERTION  07/2009   Dr. Rollene Fare  Pacemaker is a Adapta L implanted 07/2009   RENAL CIRCULATION  07/ 08/ 08   normal renal duplex doppler    TRANSTHORACIC ECHOCARDIOGRAM  10/18/2012   Atrial tracking & V paced rhythm with pacer induced LBBB. No significant intra -ventricular  dyssynchrony.   TUBAL LIGATION  1986   Social History:  reports that she has quit smoking. Her smoking use included cigarettes. She has a 45.00 pack-year smoking history. She has never used smokeless tobacco. She reports current alcohol use of about 7.0 standard drinks of  alcohol per week. She reports that she does not use drugs. Family History:  Family History  Problem Relation Age of Onset   Heart disease Mother    Diabetes Father    Heart disease Paternal Grandfather    Colon cancer Maternal Aunt    Thyroid disease Neg Hx      HOME MEDICATIONS: Allergies as of 12/17/2021       Reactions   Vicodin [hydrocodone-acetaminophen] Nausea Only   Atorvastatin Other (See Comments)   Muscle/joint pain        Medication List        Accurate as of December 17, 2021 12:01 PM. If you have any questions, ask your nurse or doctor.           acetaminophen 650 MG CR tablet Commonly known as: TYLENOL Take 650 mg by mouth every 8 (eight) hours as needed for pain.   Calcium-Vitamin D 600-400 MG-UNIT Tabs Take 1 tablet by mouth 2 (two) times daily.   escitalopram 10 MG tablet Commonly known as: LEXAPRO 5 mg. Twice weekly   Headache Relief 250-250-65 MG tablet Generic drug: aspirin-acetaminophen-caffeine   lisinopril 20 MG tablet Commonly known as: ZESTRIL Take 20 mg by mouth daily.   lisinopril-hydrochlorothiazide 20-12.5 MG tablet Commonly known as: ZESTORETIC Take 1 tablet by mouth daily.   meloxicam 15 MG tablet Commonly known as: MOBIC Take 15 mg by mouth as needed.   methimazole 5 MG tablet Commonly known as: TAPAZOLE Take 1 tablet (5 mg total) by mouth once a week.   metoprolol succinate 50 MG 24 hr tablet Commonly known as: TOPROL-XL TAKE 1 TABLET BY MOUTH ONCE DAILY WITH OR IMMEDIATELY FOLLOWING A MEAL   OVER THE COUNTER MEDICATION Take 2 tablets by mouth daily. Med Name: Mega Red fish oil,krill oil - JOINT CARE   rosuvastatin 10 MG tablet Commonly known as: CRESTOR Take 1 tablet by mouth once daily   VITAMIN D PO Take by mouth.   WOMENS 50+ MULTI VITAMIN/MIN PO Take 1 tablet by mouth daily.   Xarelto 20 MG Tabs tablet Generic drug: rivaroxaban TAKE 1 TABLET BY MOUTH ONCE DAILY WITH SUPPER          OBJECTIVE:   PHYSICAL EXAM: VS: BP 112/74 (BP Location: Left Arm, Patient Position: Sitting, Cuff Size: Small)   Pulse 74   Ht '5\' 1"'$  (1.549 m)   Wt 145 lb 9.6 oz (66 kg)   SpO2 94%   BMI 27.51 kg/m    EXAM: General: Pt appears well and is in NAD  Neck: General: Supple without adenopathy. Thyroid: Thyroid size normal.  No goiter or nodules appreciated.   Lungs: Clear with good BS bilat with no rales, rhonchi, or wheezes  Heart: Auscultation: RRR.  Abdomen: Normoactive bowel sounds, soft, nontender, without masses or organomegaly palpable  Extremities:  BL LE: No  pretibial edema normal ROM and strength.  Mental Status: Judgment, insight: Intact Orientation: Oriented to time, place, and person Mood and affect: No depression, anxiety, or agitation     DATA REVIEWED:   Latest Reference Range & Units 12/17/21 12:17  TSH 0.35 - 5.50 uIU/mL 2.51  Triiodothyronine (T3) 76 - 181 ng/dL 99  T4,Free(Direct) 0.60 - 1.60 ng/dL 0.93     ASSESSMENT / PLAN / RECOMMENDATIONS:   Hyperthyroidism   - Clinical scenario consistent with Graves' disease  - She has been of Methimazole since ~ 4,2023 - Clinically euthyroid  - Repeat TFT's are normal, will remain off Methimazole  F/U  in 6 months   Signed electronically by: Mack Guise, MD  [facility]  [facility] [address] [place], [place] [address] Phone: [phone] FAX: [phone]      CC: Sharilyn Sites, [place] [address] Phone: [phone]  Fax: [phone]   Return to Endocrinology clinic as below: Future Appointments  Date Time Provider [place]  03/08/2022  9:50 AM [facility] DEVICE REMOTES [facility]  06/07/2022  9:50 AM [facility] DEVICE REMOTES [facility]  09/06/2022  9:50 AM [facility] DEVICE REMOTES [facility]

## 2021-12-18 LAB — T3: T3, Total: 99 ng/dL (ref 76–181)

## 2021-12-29 NOTE — Progress Notes (Signed)
Remote pacemaker transmission.   

## 2022-01-04 DIAGNOSIS — H25813 Combined forms of age-related cataract, bilateral: Secondary | ICD-10-CM | POA: Diagnosis not present

## 2022-01-04 DIAGNOSIS — H01004 Unspecified blepharitis left upper eyelid: Secondary | ICD-10-CM | POA: Diagnosis not present

## 2022-01-04 DIAGNOSIS — H01002 Unspecified blepharitis right lower eyelid: Secondary | ICD-10-CM | POA: Diagnosis not present

## 2022-01-04 DIAGNOSIS — H01001 Unspecified blepharitis right upper eyelid: Secondary | ICD-10-CM | POA: Diagnosis not present

## 2022-02-19 ENCOUNTER — Encounter (HOSPITAL_COMMUNITY)
Admission: RE | Admit: 2022-02-19 | Discharge: 2022-02-19 | Disposition: A | Discharge status: Home / Self Care | Payer: Medicare HMO | Source: Ambulatory Visit | Attending: Ophthalmology | Admitting: Ophthalmology

## 2022-02-25 DIAGNOSIS — H25811 Combined forms of age-related cataract, right eye: Secondary | ICD-10-CM | POA: Diagnosis not present

## 2022-02-26 NOTE — H&P (Signed)
Surgical History & Physical  Patient Name: [name] DOB: 03-21-1951  Surgery: Cataract extraction with intraocular lens implant phacoemulsification; Right Eye  Surgeon: Baruch Goldmann MD Surgery Date:  03-01-22 Pre-Op Date:  02-18-22  HPI: A 72 Yr. old female patient 1. 1. The patient is returning for a cataract evaluation of both eyes, referred from Dr. Jorja Loa. The patient's vision is blurry. The condition's severity is constant. The complaint is associated with difficulty driving at night due to glare/halos, difficulty reading captions on tv, and poor night vision. This is negatively affecting the patient's quality of life and the patient is unable to function adequately in life with the current level of vision. HPI was performed by Baruch Goldmann .  Medical History: Dry Eyes Cataracts AFIB Caticornia Heart Problem High Blood Pressure  Review of Systems Negative Allergic/Immunologic Negative Cardiovascular Negative Constitutional Negative Ear, Nose, Mouth & Throat Negative Endocrine Negative Eyes Negative Gastrointestinal Negative Genitourinary Negative Hemotologic/Lymphatic Negative Integumentary Negative Musculoskeletal Negative Neurological Negative Psychiatry Negative Respiratory  Social   Never Smoked  Medication  None  Sx/Procedures No Ocular Sx,  Pacemaker, Carpel Tunnel,   Drug Allergies  Vicodin,   History & Physical: Heent: Cataract, right eye NECK: supple without bruits LUNGS: lungs clear to auscultation CV: regular rate and rhythm Abdomen: soft and non-tender Impression & Plan: Assessment: 1.  COMBINED FORMS AGE RELATED CATARACT; Both Eyes (H25.813) 2.  BLEPHARITIS; Right Lower Lid, Left Upper Lid, Left Lower Lid, Right Upper Lid (H01.001, H01.002,H01.004,H01.005) 3.  CONJUNCTIVOCHALASIS; Both Eyes (H11.823) 4.  ASTIGMATISM, REGULAR; Both Eyes (H52.223)  Plan: 1.  Cataract accounts for the patient's decreased vision. This visual impairment  is not correctable with a tolerable change in glasses or contact lenses. Cataract surgery with an implantation of a new lens should significantly improve the visual and functional status of the patient. Discussed all risks, benefits, alternatives, and potential complications. Discussed the procedures and recovery. Patient desires to have surgery. A-scan ordered and performed today for intra-ocular lens calculations. The surgery will be performed in order to improve vision for driving, reading, and for eye examinations. Recommend phacoemulsification with intra-ocular lens. Recommend Dextenza for post-operative pain and inflammation. Right Eye. Dilates well - shugarcaine by protocol. Toric Lens - Eyhance.  2.  Recommend regular lid cleaning.  3.  Asymptomatic.  4.  Recommend toric IOL OU.

## 2022-03-01 ENCOUNTER — Ambulatory Visit (HOSPITAL_COMMUNITY)
Admission: RE | Admit: 2022-03-01 | Discharge: 2022-03-01 | Disposition: A | Discharge status: Home / Self Care | Payer: Medicare HMO | Source: Ambulatory Visit | Attending: Ophthalmology | Admitting: Ophthalmology

## 2022-03-01 ENCOUNTER — Encounter (HOSPITAL_COMMUNITY): Payer: Self-pay | Admitting: Ophthalmology

## 2022-03-01 ENCOUNTER — Other Ambulatory Visit: Payer: Self-pay

## 2022-03-01 ENCOUNTER — Ambulatory Visit (HOSPITAL_COMMUNITY): Payer: Medicare HMO | Admitting: Anesthesiology

## 2022-03-01 ENCOUNTER — Encounter (HOSPITAL_COMMUNITY)
Admission: RE | Disposition: A | Discharge status: Home / Self Care | Payer: Self-pay | Source: Ambulatory Visit | Attending: Ophthalmology

## 2022-03-01 ENCOUNTER — Ambulatory Visit (HOSPITAL_BASED_OUTPATIENT_CLINIC_OR_DEPARTMENT_OTHER): Payer: Medicare HMO | Admitting: Anesthesiology

## 2022-03-01 DIAGNOSIS — H11823 Conjunctivochalasis, bilateral: Secondary | ICD-10-CM | POA: Diagnosis not present

## 2022-03-01 DIAGNOSIS — I442 Atrioventricular block, complete: Secondary | ICD-10-CM | POA: Diagnosis not present

## 2022-03-01 DIAGNOSIS — H0100A Unspecified blepharitis right eye, upper and lower eyelids: Secondary | ICD-10-CM | POA: Insufficient documentation

## 2022-03-01 DIAGNOSIS — E0590 Thyrotoxicosis, unspecified without thyrotoxic crisis or storm: Secondary | ICD-10-CM | POA: Insufficient documentation

## 2022-03-01 DIAGNOSIS — Z950 Presence of cardiac pacemaker: Secondary | ICD-10-CM | POA: Diagnosis not present

## 2022-03-01 DIAGNOSIS — M1990 Unspecified osteoarthritis, unspecified site: Secondary | ICD-10-CM | POA: Diagnosis not present

## 2022-03-01 DIAGNOSIS — I10 Essential (primary) hypertension: Secondary | ICD-10-CM | POA: Insufficient documentation

## 2022-03-01 DIAGNOSIS — H52201 Unspecified astigmatism, right eye: Secondary | ICD-10-CM | POA: Diagnosis not present

## 2022-03-01 DIAGNOSIS — H25811 Combined forms of age-related cataract, right eye: Secondary | ICD-10-CM | POA: Diagnosis not present

## 2022-03-01 DIAGNOSIS — H52223 Regular astigmatism, bilateral: Secondary | ICD-10-CM | POA: Diagnosis not present

## 2022-03-01 DIAGNOSIS — H259 Unspecified age-related cataract: Secondary | ICD-10-CM

## 2022-03-01 DIAGNOSIS — H25813 Combined forms of age-related cataract, bilateral: Secondary | ICD-10-CM | POA: Insufficient documentation

## 2022-03-01 DIAGNOSIS — I4891 Unspecified atrial fibrillation: Secondary | ICD-10-CM

## 2022-03-01 DIAGNOSIS — H52221 Regular astigmatism, right eye: Secondary | ICD-10-CM | POA: Diagnosis not present

## 2022-03-01 DIAGNOSIS — H0100B Unspecified blepharitis left eye, upper and lower eyelids: Secondary | ICD-10-CM | POA: Insufficient documentation

## 2022-03-01 HISTORY — PX: CATARACT EXTRACTION W/PHACO: SHX586

## 2022-03-01 SURGERY — PHACOEMULSIFICATION, CATARACT, WITH IOL INSERTION
Anesthesia: Monitor Anesthesia Care | Site: Eye | Laterality: Right

## 2022-03-01 MED ORDER — TETRACAINE HCL 0.5 % OP SOLN
1.0000 [drp] | OPHTHALMIC | Status: AC | PRN
  Administered 2022-03-01 (×3): 1 [drp] via OPHTHALMIC

## 2022-03-01 MED ORDER — SODIUM CHLORIDE 0.9% FLUSH
INTRAVENOUS | Status: DC | PRN
  Administered 2022-03-01: 5 mL via INTRAVENOUS

## 2022-03-01 MED ORDER — SODIUM HYALURONATE 10 MG/ML IO SOLUTION
PREFILLED_SYRINGE | INTRAOCULAR | Status: DC | PRN
  Administered 2022-03-01: 0.85 mL via INTRAOCULAR

## 2022-03-01 MED ORDER — EPINEPHRINE PF 1 MG/ML IJ SOLN
INTRAMUSCULAR | Status: AC
  Filled 2022-03-01: qty 2

## 2022-03-01 MED ORDER — MIDAZOLAM HCL 2 MG/2ML IJ SOLN
INTRAMUSCULAR | Status: AC
  Filled 2022-03-01: qty 2

## 2022-03-01 MED ORDER — EPINEPHRINE PF 1 MG/ML IJ SOLN
INTRAOCULAR | Status: DC | PRN
  Administered 2022-03-01: 500 mL

## 2022-03-01 MED ORDER — PHENYLEPHRINE HCL 2.5 % OP SOLN
1.0000 [drp] | OPHTHALMIC | Status: AC | PRN
Start: 2022-03-01 — End: 2022-03-01
  Administered 2022-03-01 (×3): 1 [drp] via OPHTHALMIC

## 2022-03-01 MED ORDER — LIDOCAINE HCL 3.5 % OP GEL: 1.0000 | Freq: Once | OPHTHALMIC | Status: DC

## 2022-03-01 MED ORDER — MIDAZOLAM HCL 2 MG/2ML IJ SOLN
INTRAMUSCULAR | Status: DC | PRN
  Administered 2022-03-01: 2 mg via INTRAVENOUS

## 2022-03-01 MED ORDER — LIDOCAINE HCL (PF) 1 % IJ SOLN
INTRAOCULAR | Status: DC | PRN
  Administered 2022-03-01: 1 mL via OPHTHALMIC

## 2022-03-01 MED ORDER — STERILE WATER FOR IRRIGATION IR SOLN
Status: DC | PRN
  Administered 2022-03-01: 500 mL

## 2022-03-01 MED ORDER — SODIUM HYALURONATE 23MG/ML IO SOSY
PREFILLED_SYRINGE | INTRAOCULAR | Status: DC | PRN
  Administered 2022-03-01: 0.6 mL via INTRAOCULAR

## 2022-03-01 MED ORDER — MOXIFLOXACIN HCL 0.5 % OP SOLN
OPHTHALMIC | Status: DC | PRN
  Administered 2022-03-01: 1 [drp] via OPHTHALMIC

## 2022-03-01 MED ORDER — BSS IO SOLN
INTRAOCULAR | Status: DC | PRN
  Administered 2022-03-01: 15 mL via INTRAOCULAR

## 2022-03-01 MED ORDER — POVIDONE-IODINE 5 % OP SOLN
OPHTHALMIC | Status: DC | PRN
  Administered 2022-03-01: 1 via OPHTHALMIC

## 2022-03-01 MED ORDER — TROPICAMIDE 1 % OP SOLN
1.0000 [drp] | OPHTHALMIC | Status: AC | PRN
  Administered 2022-03-01 (×3): 1 [drp] via OPHTHALMIC

## 2022-03-01 MED ORDER — LACTATED RINGERS IV SOLN: INTRAVENOUS | Status: DC

## 2022-03-01 SURGICAL SUPPLY — 16 items
CATARACT SUITE SIGHTPATH (MISCELLANEOUS) ×1 IMPLANT
CLOTH BEACON ORANGE TIMEOUT ST (SAFETY) ×1 IMPLANT
DRAPE HALF SHEET 40X57 (DRAPES) IMPLANT
EYE SHIELD UNIVERSAL CLEAR (GAUZE/BANDAGES/DRESSINGS) IMPLANT
FEE CATARACT SUITE SIGHTPATH (MISCELLANEOUS) ×1 IMPLANT
GLOVE BIOGEL PI IND STRL 7.0 (GLOVE) ×2 IMPLANT
LENS IOL EYHANCE TRC 300 23.5 IMPLANT
LENS IOL TORIC DIU300 23.5 ×1 IMPLANT
NDL HYPO 18GX1.5 BLUNT FILL (NEEDLE) ×1 IMPLANT
NEEDLE HYPO 18GX1.5 BLUNT FILL (NEEDLE) ×1 IMPLANT
PAD ARMBOARD 7.5X6 YLW CONV (MISCELLANEOUS) ×1 IMPLANT
RING MALYGIN 7.0 (MISCELLANEOUS) IMPLANT
SYR TB 1ML LL NO SAFETY (SYRINGE) ×1 IMPLANT
TAPE SURG TRANSPORE 1 IN (GAUZE/BANDAGES/DRESSINGS) IMPLANT
TAPE SURGICAL TRANSPORE 1 IN (GAUZE/BANDAGES/DRESSINGS) ×1
WATER STERILE IRR 500ML POUR (IV SOLUTION) IMPLANT

## 2022-03-01 NOTE — Interval H&P Note (Signed)
History and Physical Interval Note:  03/01/2022 8:29 AM  [name]  has presented today for surgery, with the diagnosis of combined forms age related cataract; right.  The various methods of treatment have been discussed with the patient and family. After consideration of risks, benefits and other options for treatment, the patient has consented to  Procedure(s) with comments: CATARACT EXTRACTION PHACO AND INTRAOCULAR LENS PLACEMENT (IOC) (Right) - CDE: as a surgical intervention.  The patient's history has been reviewed, patient examined, no change in status, stable for surgery.  I have reviewed the patient's chart and labs.  Questions were answered to the patient's satisfaction.     Baruch Goldmann

## 2022-03-01 NOTE — Transfer of Care (Signed)
Immediate Anesthesia Transfer of Care Note  Patient: [name]  Procedure(s) Performed: CATARACT EXTRACTION PHACO AND INTRAOCULAR LENS PLACEMENT (IOC) (Right: Eye)  Patient Location: PACU  Anesthesia Type:MAC  Level of Consciousness: awake, alert , oriented and patient cooperative  Airway & Oxygen Therapy: Patient Spontanous Breathing  Post-op Assessment: Report given to RN, Post -op Vital signs reviewed and stable and Patient moving all extremities X 4  Post vital signs: Reviewed and stable  Last Vitals:  Vitals Value Taken Time  BP    Temp    Pulse    Resp    SpO2      Last Pain:  Vitals:   03/01/22 0819  TempSrc: Oral  PainSc: 0-No pain      Patients Stated Pain Goal: 4 (57/01/77 9390)  Complications: No notable events documented.

## 2022-03-01 NOTE — Op Note (Signed)
Date of procedure: 03/01/22  Pre-operative diagnosis: Visually significant age-related combined cataract, Right Eye; Visually Significant Astigmatism, Right Eye (H25.811)  Post-operative diagnosis: Visually significant age-related cataract, Right Eye; Visually Significant Astigmatism, Right Eye  Procedure: Removal of cataract via phacoemulsification and insertion of intra-ocular lens Wynetta Emery and Johnson [id number] +23.0D into the capsular bag of the Right Eye  Attending surgeon: Gerda Diss. Lorielle Boehning, MD, MA  Anesthesia: MAC, Topical [place]  Complications: None  Estimated Blood Loss: <63m (minimal)  Specimens: None  Implants: As above  Indications:  Visually significant age-related cataract, Right Eye; Visually Significant Astigmatism, Right Eye  Procedure:  The patient was seen and identified in the pre-operative area. The operative eye was identified and dilated.  The operative eye was marked.  Pre-operative toric markers were used to mark the eye at 0 and 180 degrees. Topical anesthesia was administered to the operative eye.     The patient was then to the operative suite and placed in the supine position.  A timeout was performed confirming the patient, procedure to be performed, and all other relevant information.   The patient's face was prepped and draped in the usual fashion for intra-ocular surgery.  A lid speculum was placed into the operative eye and the surgical microscope moved into place and focused.  A superotemporal paracentesis was created using a 20 gauge paracentesis blade.  Shugarcaine was injected into the anterior chamber.  Viscoelastic was injected into the anterior chamber.  A temporal clear-corneal main wound incision was created using a 2.431mmicrokeratome.  A continuous curvilinear capsulorrhexis was initiated using an irrigating cystitome and completed using capsulorrhexis forceps.  Hydrodissection and hydrodeliniation were performed.  Viscoelastic was injected into the  anterior chamber.  A phacoemulsification handpiece and a chopper as a second instrument were used to remove the nucleus and epinucleus. The irrigation/aspiration handpiece was used to remove any remaining cortical material.   The capsular bag was reinflated with viscoelastic, checked, and found to be intact.  The intraocular lens was inserted into the capsular bag and dialed into place using a Kuglen hook to 170 degrees.  The irrigation/aspiration handpiece was used to remove any remaining viscoelastic.  The clear corneal wound and paracentesis wounds were then hydrated and checked with Weck-Cels to be watertight.  The lid-speculum and drape was removed, and the patient's face was cleaned with a wet and dry 4x4.  Maxitrol was instilled in the eye before a clear shield was taped over the eye. The patient was taken to the post-operative care unit in good condition, having tolerated the procedure well.  Post-Op Instructions: [facility] and will receive all other orders and instructions.

## 2022-03-01 NOTE — Discharge Instructions (Signed)
Please discharge patient when stable, will follow up today with Dr. Jasneet Schobert at the [facility] [place] office immediately following discharge.  Leave shield in place until visit.  All paperwork with discharge instructions will be given at the office.  [facility] Address:  [address]  [place], [place] [address]  

## 2022-03-01 NOTE — Anesthesia Postprocedure Evaluation (Signed)
Anesthesia Post Note  Patient: [name]  Procedure(s) Performed: CATARACT EXTRACTION PHACO AND INTRAOCULAR LENS PLACEMENT (IOC) (Right: Eye)  Patient location during evaluation: Phase II Anesthesia Type: MAC Level of consciousness: awake and alert and oriented Pain management: pain level controlled Vital Signs Assessment: post-procedure vital signs reviewed and stable Respiratory status: spontaneous breathing, nonlabored ventilation and respiratory function stable Cardiovascular status: stable and blood pressure returned to baseline Postop Assessment: no apparent nausea or vomiting Anesthetic complications: no   No notable events documented.   Last Vitals:  Vitals:   03/01/22 0819 03/01/22 0854  BP: (!) 154/84 99/75  Pulse: 60 65  Resp: (!) 21 15  Temp: 36.7 C 36.5 C  SpO2: 98% 100%    Last Pain:  Vitals:   03/01/22 0854  TempSrc: Oral  PainSc: 0-No pain                 Leyton Magoon C Gilbert [place]

## 2022-03-01 NOTE — Anesthesia Preprocedure Evaluation (Addendum)
Anesthesia Evaluation  Patient identified by MRN, date of birth, ID band Patient awake    Reviewed: Allergy & Precautions, NPO status , Patient's Chart, lab work & pertinent test results, reviewed documented beta blocker date and time   History of Anesthesia Complications Negative for: history of anesthetic complications  Airway Mallampati: II  TM Distance: >3 FB Neck ROM: Full    Dental  (+) Dental Advisory Given, Caps   Pulmonary neg pulmonary ROS, former smoker,    Pulmonary exam normal breath sounds clear to auscultation       Cardiovascular Exercise Tolerance: Good hypertension, Pt. on medications and Pt. on home beta blockers Normal cardiovascular exam+ dysrhythmias Atrial Fibrillation + pacemaker (complete heart block)  Rhythm:Regular Rate:Normal     Neuro/Psych negative neurological ROS  negative psych ROS   GI/Hepatic negative GI ROS, Neg liver ROS,   Endo/Other  Hyperthyroidism   Renal/GU negative Renal ROS  negative genitourinary   Musculoskeletal  (+) Arthritis , Osteoarthritis,    Abdominal   Peds negative pediatric ROS (+)  Hematology negative hematology ROS (+)   Anesthesia Other Findings   Reproductive/Obstetrics negative OB ROS                            Anesthesia Physical Anesthesia Plan  ASA: 3  Anesthesia Plan: MAC   Post-op Pain Management: Minimal or no pain anticipated   Induction: Intravenous  PONV Risk Score and Plan:   Airway Management Planned: Nasal Cannula and Natural Airway  Additional Equipment:   Intra-op Plan:   Post-operative Plan:   Informed Consent: I have reviewed the patients History and Physical, chart, labs and discussed the procedure including the risks, benefits and alternatives for the proposed anesthesia with the patient or authorized representative who has indicated his/her understanding and acceptance.     Dental advisory  given  Plan Discussed with: CRNA and Surgeon  Anesthesia Plan Comments:         Anesthesia Quick Evaluation

## 2022-03-04 ENCOUNTER — Encounter (HOSPITAL_COMMUNITY): Payer: Self-pay | Admitting: Ophthalmology

## 2022-03-08 ENCOUNTER — Ambulatory Visit (INDEPENDENT_AMBULATORY_CARE_PROVIDER_SITE_OTHER): Payer: Medicare HMO

## 2022-03-08 DIAGNOSIS — I442 Atrioventricular block, complete: Secondary | ICD-10-CM | POA: Diagnosis not present

## 2022-03-10 LAB — CUP PACEART REMOTE DEVICE CHECK
Battery Impedance: 3918 Ohm
Battery Remaining Longevity: 16 mo
Battery Voltage: 2.7 V
Brady Statistic AP VP Percent: 8 %
Brady Statistic AP VS Percent: 0 %
Brady Statistic AS VP Percent: 92 %
Brady Statistic AS VS Percent: 0 %
Date Time Interrogation Session: 20230918072457
Implantable Lead Implant Date: 20110208
Implantable Lead Implant Date: 20110208
Implantable Lead Location: 753859
Implantable Lead Location: 753860
Implantable Lead Model: 5076
Implantable Lead Model: 5076
Implantable Pulse Generator Implant Date: 20110208
Lead Channel Impedance Value: 461 Ohm
Lead Channel Impedance Value: 555 Ohm
Lead Channel Pacing Threshold Amplitude: 0.375 V
Lead Channel Pacing Threshold Amplitude: 0.75 V
Lead Channel Pacing Threshold Pulse Width: 0.4 ms
Lead Channel Pacing Threshold Pulse Width: 0.4 ms
Lead Channel Setting Pacing Amplitude: 1.5 V
Lead Channel Setting Pacing Amplitude: 2.25 V
Lead Channel Setting Pacing Pulse Width: 0.4 ms
Lead Channel Setting Sensing Sensitivity: 2 mV

## 2022-03-18 ENCOUNTER — Other Ambulatory Visit (HOSPITAL_COMMUNITY): Payer: Self-pay | Admitting: Family Medicine

## 2022-03-18 DIAGNOSIS — H25812 Combined forms of age-related cataract, left eye: Secondary | ICD-10-CM | POA: Diagnosis not present

## 2022-03-18 DIAGNOSIS — Z1231 Encounter for screening mammogram for malignant neoplasm of breast: Secondary | ICD-10-CM

## 2022-03-19 ENCOUNTER — Encounter (HOSPITAL_COMMUNITY)
Admission: RE | Admit: 2022-03-19 | Discharge: 2022-03-19 | Disposition: A | Discharge status: Home / Self Care | Payer: Medicare HMO | Source: Ambulatory Visit | Attending: Ophthalmology | Admitting: Ophthalmology

## 2022-03-22 NOTE — Progress Notes (Signed)
Remote pacemaker transmission.   

## 2022-03-23 NOTE — H&P (Signed)
Surgical History & Physical  Patient Name: [name] DOB: [birth year]  Surgery: Cataract extraction with intraocular lens implant phacoemulsification; Left Eye  Surgeon: Baruch Goldmann MD Surgery Date:  03-26-22 Pre-Op Date:  03-08-22  HPI: A 41 Yr. old female patient 1. The patient is returning after cataract post-op. The right eye is affected. Status post cataract post-op, which began 1 year ago: Since the last visit, the affected area is doing well. The patient's vision is stable. Patient is following medication instructions. The patient's left vision is blurry. The condition's severity is constant. The complaint is associated with difficulty driving at night due to glare/halos, difficulty reading captions on tv, and poor night vision. This is negatively affecting the patient's quality of life and the patient is unable to function adequately in life with the current level of vision. HPI was performed by Baruch Goldmann .  Medical History: Dry Eyes Cataracts AFIB Caticornia Heart Problem High Blood Pressure  Review of Systems Negative Allergic/Immunologic Negative Cardiovascular Negative Constitutional Negative Ear, Nose, Mouth & Throat Negative Endocrine Negative Eyes Negative Gastrointestinal Negative Genitourinary Negative Hemotologic/Lymphatic Negative Integumentary Negative Musculoskeletal Negative Neurological Negative Psychiatry Negative Respiratory  Social   Never Smoked  Medication Prednisolone-Moxifloxacin-Bromfenac,    Sx/Procedures Phaco c IOL OD with Toric,  Pacemaker, Carpel Tunnel,   Drug Allergies  Vicodin,   History & Physical: Heent: Cataract, left eye NECK: supple without bruits LUNGS: lungs clear to auscultation CV: regular rate and rhythm Abdomen: soft and non-tender Impression & Plan: Assessment: 1.  CATARACT EXTRACTION STATUS; Right Eye (Z98.41) 2.  COMBINED FORMS AGE RELATED CATARACT; Left Eye (H25.812) 3.  ASTIGMATISM, REGULAR; Both  Eyes (H52.223)  Plan: 1.  1 week after cataract surgery. Doing well with improved vision and normal eye pressure. Call with any problems or concerns. Continue Pred-Moxi-Brom 2x/day for 3 more weeks.  2.  Cataract accounts for the patient's decreased vision. This visual impairment is not correctable with a tolerable change in glasses or contact lenses. Cataract surgery with an implantation of a new lens should significantly improve the visual and functional status of the patient. Discussed all risks, benefits, alternatives, and potential complications. Discussed the procedures and recovery. Patient desires to have surgery. A-scan ordered and performed today for intra-ocular lens calculations. The surgery will be performed in order to improve vision for driving, reading, and for eye examinations. Recommend phacoemulsification with intra-ocular lens. Recommend Dextenza for post-operative pain and inflammation. Left Eye. Surgery required to correct imbalance of vision. Dilates well - shugarcaine by protocol. Toric Lens.  3.  Recommend toric IOL OU.

## 2022-03-26 ENCOUNTER — Encounter (HOSPITAL_COMMUNITY)
Admission: RE | Disposition: A | Discharge status: Home / Self Care | Payer: Self-pay | Source: Home / Self Care | Attending: Ophthalmology

## 2022-03-26 ENCOUNTER — Ambulatory Visit (HOSPITAL_COMMUNITY): Payer: Medicare HMO | Admitting: Anesthesiology

## 2022-03-26 ENCOUNTER — Encounter (HOSPITAL_COMMUNITY): Payer: Self-pay | Admitting: Ophthalmology

## 2022-03-26 ENCOUNTER — Ambulatory Visit (HOSPITAL_BASED_OUTPATIENT_CLINIC_OR_DEPARTMENT_OTHER): Payer: Medicare HMO | Admitting: Anesthesiology

## 2022-03-26 ENCOUNTER — Ambulatory Visit (HOSPITAL_COMMUNITY)
Admission: RE | Admit: 2022-03-26 | Discharge: 2022-03-26 | Disposition: A | Discharge status: Home / Self Care | Payer: Medicare HMO | Attending: Ophthalmology | Admitting: Ophthalmology

## 2022-03-26 DIAGNOSIS — Z9841 Cataract extraction status, right eye: Secondary | ICD-10-CM | POA: Diagnosis not present

## 2022-03-26 DIAGNOSIS — I4891 Unspecified atrial fibrillation: Secondary | ICD-10-CM | POA: Insufficient documentation

## 2022-03-26 DIAGNOSIS — H25812 Combined forms of age-related cataract, left eye: Secondary | ICD-10-CM

## 2022-03-26 DIAGNOSIS — H52223 Regular astigmatism, bilateral: Secondary | ICD-10-CM | POA: Insufficient documentation

## 2022-03-26 DIAGNOSIS — Z79899 Other long term (current) drug therapy: Secondary | ICD-10-CM | POA: Insufficient documentation

## 2022-03-26 DIAGNOSIS — H52222 Regular astigmatism, left eye: Secondary | ICD-10-CM | POA: Diagnosis not present

## 2022-03-26 DIAGNOSIS — I10 Essential (primary) hypertension: Secondary | ICD-10-CM | POA: Diagnosis not present

## 2022-03-26 DIAGNOSIS — Z87891 Personal history of nicotine dependence: Secondary | ICD-10-CM | POA: Insufficient documentation

## 2022-03-26 DIAGNOSIS — I442 Atrioventricular block, complete: Secondary | ICD-10-CM | POA: Diagnosis not present

## 2022-03-26 DIAGNOSIS — H52202 Unspecified astigmatism, left eye: Secondary | ICD-10-CM

## 2022-03-26 DIAGNOSIS — Z950 Presence of cardiac pacemaker: Secondary | ICD-10-CM | POA: Insufficient documentation

## 2022-03-26 HISTORY — PX: CATARACT EXTRACTION W/PHACO: SHX586

## 2022-03-26 SURGERY — PHACOEMULSIFICATION, CATARACT, WITH IOL INSERTION
Anesthesia: Monitor Anesthesia Care | Site: Eye | Laterality: Left

## 2022-03-26 MED ORDER — MIDAZOLAM HCL 2 MG/2ML IJ SOLN
INTRAMUSCULAR | Status: DC | PRN
  Administered 2022-03-26: 2 mg via INTRAVENOUS

## 2022-03-26 MED ORDER — PHENYLEPHRINE HCL 2.5 % OP SOLN
1.0000 [drp] | OPHTHALMIC | Status: AC | PRN
  Administered 2022-03-26 (×3): 1 [drp] via OPHTHALMIC

## 2022-03-26 MED ORDER — TETRACAINE HCL 0.5 % OP SOLN
1.0000 [drp] | OPHTHALMIC | Status: AC | PRN
Start: 2022-03-26 — End: 2022-03-26
  Administered 2022-03-26 (×3): 1 [drp] via OPHTHALMIC

## 2022-03-26 MED ORDER — EPINEPHRINE PF 1 MG/ML IJ SOLN
INTRAMUSCULAR | Status: AC
  Filled 2022-03-26: qty 2

## 2022-03-26 MED ORDER — SODIUM HYALURONATE 23MG/ML IO SOSY
PREFILLED_SYRINGE | INTRAOCULAR | Status: DC | PRN
  Administered 2022-03-26: 0.6 mL via INTRAOCULAR

## 2022-03-26 MED ORDER — MIDAZOLAM HCL 2 MG/2ML IJ SOLN
INTRAMUSCULAR | Status: AC
  Filled 2022-03-26: qty 2

## 2022-03-26 MED ORDER — LIDOCAINE HCL 3.5 % OP GEL
1.0000 | Freq: Once | OPHTHALMIC | Status: AC
  Administered 2022-03-26: 1 via OPHTHALMIC

## 2022-03-26 MED ORDER — TROPICAMIDE 1 % OP SOLN
1.0000 [drp] | OPHTHALMIC | Status: AC | PRN
  Administered 2022-03-26 (×3): 1 [drp] via OPHTHALMIC

## 2022-03-26 MED ORDER — EPINEPHRINE PF 1 MG/ML IJ SOLN
INTRAOCULAR | Status: DC | PRN
  Administered 2022-03-26: 500 mL

## 2022-03-26 MED ORDER — LIDOCAINE HCL (PF) 1 % IJ SOLN
INTRAOCULAR | Status: DC | PRN
  Administered 2022-03-26: 1 mL via OPHTHALMIC

## 2022-03-26 MED ORDER — MOXIFLOXACIN HCL 0.5 % OP SOLN
OPHTHALMIC | Status: DC | PRN
  Administered 2022-03-26: 2 [drp] via OPHTHALMIC

## 2022-03-26 MED ORDER — SODIUM HYALURONATE 10 MG/ML IO SOLUTION
PREFILLED_SYRINGE | INTRAOCULAR | Status: DC | PRN
  Administered 2022-03-26: 0.85 mL via INTRAOCULAR

## 2022-03-26 MED ORDER — BSS IO SOLN
INTRAOCULAR | Status: DC | PRN
  Administered 2022-03-26: 15 mL via INTRAOCULAR

## 2022-03-26 MED ORDER — STERILE WATER FOR IRRIGATION IR SOLN
Status: DC | PRN
  Administered 2022-03-26: 50 mL

## 2022-03-26 MED ORDER — POVIDONE-IODINE 5 % OP SOLN
OPHTHALMIC | Status: DC | PRN
  Administered 2022-03-26: 1 via OPHTHALMIC

## 2022-03-26 SURGICAL SUPPLY — 16 items
CATARACT SUITE SIGHTPATH (MISCELLANEOUS) ×1 IMPLANT
CLOTH BEACON ORANGE TIMEOUT ST (SAFETY) ×1 IMPLANT
DRAPE HALF SHEET 40X57 (DRAPES) IMPLANT
EYE SHIELD UNIVERSAL CLEAR (GAUZE/BANDAGES/DRESSINGS) IMPLANT
FEE CATARACT SUITE SIGHTPATH (MISCELLANEOUS) ×1 IMPLANT
GLOVE BIOGEL PI IND STRL 7.0 (GLOVE) ×2 IMPLANT
GLOVE SURG SS PI 7.0 STRL IVOR (GLOVE) IMPLANT
LENS IOL EYHANCE TORIC II 23.0 ×1 IMPLANT
LENS IOL EYHANCE TRC 225 23.0 IMPLANT
LENS IOL EYHNC TORIC 225 23.0 ×1 IMPLANT
NDL HYPO 18GX1.5 BLUNT FILL (NEEDLE) ×1 IMPLANT
NEEDLE HYPO 18GX1.5 BLUNT FILL (NEEDLE) ×1 IMPLANT
PAD ARMBOARD 7.5X6 YLW CONV (MISCELLANEOUS) ×1 IMPLANT
SYR TB 1ML LL NO SAFETY (SYRINGE) ×1 IMPLANT
TAPE SURG TRANSPORE 1 IN (GAUZE/BANDAGES/DRESSINGS) IMPLANT
TAPE SURGICAL TRANSPORE 1 IN (GAUZE/BANDAGES/DRESSINGS) ×1

## 2022-03-26 NOTE — Anesthesia Postprocedure Evaluation (Signed)
Anesthesia Post Note  Patient: [name]  Procedure(s) Performed: CATARACT EXTRACTION PHACO AND INTRAOCULAR LENS PLACEMENT (IOC) (Left: Eye)  Patient location during evaluation: Phase II Anesthesia Type: MAC Level of consciousness: awake and alert and oriented Pain management: pain level controlled Vital Signs Assessment: post-procedure vital signs reviewed and stable Respiratory status: spontaneous breathing, nonlabored ventilation and respiratory function stable Cardiovascular status: stable and blood pressure returned to baseline Postop Assessment: no apparent nausea or vomiting Anesthetic complications: no   No notable events documented.   Last Vitals:  Vitals:   03/26/22 1036 03/26/22 1125  BP: (!) 158/85 (!) 160/76  Pulse: 63 64  Resp: 20 (!) 22  Temp: 36.7 C (!) 36.4 C  SpO2: 96% 100%    Last Pain:  Vitals:   03/26/22 1125  TempSrc: Oral  PainSc: 0-No pain                 Jakyle Petrucelli C Shandie Bertz

## 2022-03-26 NOTE — Interval H&P Note (Signed)
History and Physical Interval Note:  03/26/2022 11:03 AM  [name]  has presented today for surgery, with the diagnosis of combined forms age related cataract; left.  The various methods of treatment have been discussed with the patient and family. After consideration of risks, benefits and other options for treatment, the patient has consented to  Procedure(s) with comments: CATARACT EXTRACTION PHACO AND INTRAOCULAR LENS PLACEMENT ([place]) (Left) - CDE as a surgical intervention.  The patient's history has been reviewed, patient examined, no change in status, stable for surgery.  I have reviewed the patient's chart and labs.  Questions were answered to the patient's satisfaction.     Baruch Goldmann

## 2022-03-26 NOTE — Op Note (Signed)
Date of procedure: 03/26/22  Pre-operative diagnosis: Visually significant age-related combined cataract, Left Eye; Visually Significant Astigmatism, Left Eye (H25.812)  Post-operative diagnosis: Visually significant age-related cataract, Left Eye; Visually Significant Astigmatism, Left Eye  Procedure: Removal of cataract via phacoemulsification and insertion of intra-ocular lens Wynetta Emery and Johnson [id number] +23.0D into the capsular bag of the Left Eye  Attending surgeon: Gerda Diss. Frimet Durfee, MD, MA  Anesthesia: MAC, Topical Akten  Complications: None  Estimated Blood Loss: <58m (minimal)  Specimens: None  Implants: As above  Indications:  Visually significant age-related cataract, Left Eye; Visually Significant Astigmatism, Left Eye  Procedure:  The patient was seen and identified in the pre-operative area. The operative eye was identified and dilated.  The operative eye was marked.  Pre-operative toric markers were used to mark the eye at 0 and 180 degrees. Topical anesthesia was administered to the operative eye.     The patient was then to the operative suite and placed in the supine position.  A timeout was performed confirming the patient, procedure to be performed, and all other relevant information.   The patient's face was prepped and draped in the usual fashion for intra-ocular surgery.  A lid speculum was placed into the operative eye and the surgical microscope moved into place and focused.  A superotemporal paracentesis was created using a 20 gauge paracentesis blade.  Shugarcaine was injected into the anterior chamber.  Viscoelastic was injected into the anterior chamber.  A temporal clear-corneal main wound incision was created using a 2.423mmicrokeratome.  A continuous curvilinear capsulorrhexis was initiated using an irrigating cystitome and completed using capsulorrhexis forceps.  Hydrodissection and hydrodeliniation were performed.  Viscoelastic was injected into the anterior  chamber.  A phacoemulsification handpiece and a chopper as a second instrument were used to remove the nucleus and epinucleus. The irrigation/aspiration handpiece was used to remove any remaining cortical material.   The capsular bag was reinflated with viscoelastic, checked, and found to be intact.  The eye was marked to the per-op meridian.  The intraocular lens was inserted into the capsular bag and dialed into place using a Kuglen hook to 177 degrees.  The irrigation/aspiration handpiece was used to remove any remaining viscoelastic.  The clear corneal wound and paracentesis wounds were then hydrated and checked with Weck-Cels to be watertight.  The lid-speculum and drape was removed, and the patient's face was cleaned with a wet and dry 4x4.  Moxifloxacin drops were instilled on the eye.  A clear shield was taped over the eye. The patient was taken to the post-operative care unit in good condition, having tolerated the procedure well.  Post-Op Instructions: [facility] and will receive all other orders and instructions.

## 2022-03-26 NOTE — Transfer of Care (Signed)
Immediate Anesthesia Transfer of Care Note  Patient: [name]  Procedure(s) Performed: CATARACT EXTRACTION PHACO AND INTRAOCULAR LENS PLACEMENT (I[facility]) (Left: Eye)  Patient Location: PACU  Anesthesia Type:MAC  Level of Consciousness: awake, alert  and oriented  Airway & Oxygen Therapy: Patient Spontanous Breathing  Post-op Assessment: Report given to RN and Post -op Vital signs reviewed and stable  Post vital signs: Reviewed and stable  Last Vitals:  Vitals Value Taken Time  BP 160/76 03/26/22 1125  Temp 36.4 C 03/26/22 1125  Pulse 64 03/26/22 1125  Resp 22 03/26/22 1125  SpO2 100 % 03/26/22 1125    Last Pain:  Vitals:   03/26/22 1125  TempSrc: Oral  PainSc: 0-No pain      Patients Stated Pain Goal: 8 (51/83/35 8251)  Complications: No notable events documented.

## 2022-03-26 NOTE — Discharge Instructions (Addendum)
Please discharge patient when stable, will follow up today with Dr. Wrzosek at the [facility] [place] office immediately following discharge.  Leave shield in place until visit.  All paperwork with discharge instructions will be given at the office.  [facility] Address:  [address]  [place], [place] [address]  

## 2022-03-26 NOTE — Anesthesia Preprocedure Evaluation (Signed)
Anesthesia Evaluation  Patient identified by MRN, date of birth, ID band Patient awake    Reviewed: Allergy & Precautions, NPO status , Patient's Chart, lab work & pertinent test results, reviewed documented beta blocker date and time   History of Anesthesia Complications Negative for: history of anesthetic complications  Airway Mallampati: II  TM Distance: >3 FB Neck ROM: Full    Dental  (+) Dental Advisory Given, Caps   Pulmonary neg pulmonary ROS, former smoker,    Pulmonary exam normal breath sounds clear to auscultation       Cardiovascular Exercise Tolerance: Good hypertension, Pt. on medications and Pt. on home beta blockers Normal cardiovascular exam+ dysrhythmias Atrial Fibrillation + pacemaker (complete heart block)  Rhythm:Regular Rate:Normal     Neuro/Psych negative neurological ROS  negative psych ROS   GI/Hepatic negative GI ROS, Neg liver ROS,   Endo/Other  Hyperthyroidism   Renal/GU negative Renal ROS  negative genitourinary   Musculoskeletal  (+) Arthritis , Osteoarthritis,    Abdominal   Peds negative pediatric ROS (+)  Hematology negative hematology ROS (+)   Anesthesia Other Findings   Reproductive/Obstetrics negative OB ROS                             Anesthesia Physical  Anesthesia Plan  ASA: 3  Anesthesia Plan: MAC   Post-op Pain Management: Minimal or no pain anticipated   Induction: Intravenous  PONV Risk Score and Plan:   Airway Management Planned: Nasal Cannula and Natural Airway  Additional Equipment:   Intra-op Plan:   Post-operative Plan:   Informed Consent: I have reviewed the patients History and Physical, chart, labs and discussed the procedure including the risks, benefits and alternatives for the proposed anesthesia with the patient or authorized representative who has indicated his/her understanding and acceptance.     Dental  advisory given  Plan Discussed with: CRNA and Surgeon  Anesthesia Plan Comments:         Anesthesia Quick Evaluation

## 2022-03-30 ENCOUNTER — Encounter (HOSPITAL_COMMUNITY): Payer: Self-pay | Admitting: Ophthalmology

## 2022-04-12 ENCOUNTER — Ambulatory Visit: Payer: Medicare HMO | Attending: Cardiovascular Disease | Admitting: Cardiovascular Disease

## 2022-04-12 ENCOUNTER — Encounter: Payer: Self-pay | Admitting: Cardiovascular Disease

## 2022-04-12 VITALS — BP 132/78 | HR 71 | Ht 61.0 in | Wt 136.0 lb

## 2022-04-12 DIAGNOSIS — D6869 Other thrombophilia: Secondary | ICD-10-CM

## 2022-04-12 DIAGNOSIS — I442 Atrioventricular block, complete: Secondary | ICD-10-CM

## 2022-04-12 DIAGNOSIS — Z950 Presence of cardiac pacemaker: Secondary | ICD-10-CM

## 2022-04-12 DIAGNOSIS — E0590 Thyrotoxicosis, unspecified without thyrotoxic crisis or storm: Secondary | ICD-10-CM | POA: Diagnosis not present

## 2022-04-12 DIAGNOSIS — I10 Essential (primary) hypertension: Secondary | ICD-10-CM | POA: Diagnosis not present

## 2022-04-12 DIAGNOSIS — E7800 Pure hypercholesterolemia, unspecified: Secondary | ICD-10-CM | POA: Diagnosis not present

## 2022-04-12 DIAGNOSIS — I480 Paroxysmal atrial fibrillation: Secondary | ICD-10-CM

## 2022-04-12 NOTE — Progress Notes (Signed)
Cardiology Office Note    Date:  04/14/2022   ID:  [name], [name] [birth year], MRN [id number]  PCP:  Sharilyn Sites, MD  Cardiologist:  Quay Burow, MD: Sanda Klein, MD   Chief Complaint  Patient presents with   Follow-up  Pacemaker/complete heart block/atrial fibrillation  History of Present Illness:  [name] is a 71 y.o. female with complete heart block, paroxysmal atrial fibrillation, hypertension, hyperlipidemia returning for follow-up.  Her device was implanted for symptomatic bradycardia and she has a history of left bundle branch block. She subsequently has developed complete heart block and is now pacemaker dependent. Her Medtronic Adapta device was implanted in 2011.  Subsequent to the pacemaker implantation she developed episodes of infrequent paroxysmal atrial fibrillation that have been consistently asymptomatic.  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  The episodes of atrial fibrillation were not clearly associated with a thyroid disorder and have occurred even when she became euthyroid.  She has not had any cardiac issues.  He does have recurrent problems with anxiety and has started taking escitalopram again.  She takes the medication in different doses on different days.  The patient specifically denies any chest pain at rest exertion, dyspnea at rest or with exertion, orthopnea, paroxysmal nocturnal dyspnea, syncope, palpitations, focal neurological deficits, intermittent claudication, lower extremity edema, unexplained weight gain, cough, hemoptysis or wheezing.  Pacemaker interrogation shows normal device function.  She has an estimated 15 months left on the current generator.  The presenting rhythm is atrial sensed ventricular paced which is her rhythm almost all the time (she has a paced rhythm 8%, V paced rhythm 99.6%).  She has an overall burden of paroxysmal atrial fibrillation of only 0.4%.  She has  not had any atrial fibrillation since 01/18/2022.  The longest episode lasted for about 7.5 hours.  The episodes are consistently asymptomatic.  She had a single episode of 8 beat nonsustained VT that occurred more than 6 months ago and has not recurred.  All lead parameters are excellent.  Both the atrial and ventricular leads are Medtronic 5076 leads.  She has well-controlled hypertension and takes a statin for hyperlipidemia, with labs monitored via Dr. Hilma Favors. She had a normal nuclear perfusion stress test in May 2017  Past Medical History:  Diagnosis Date   Arthritis    Atrial tachycardia    Atypical chest pain    Bundle branch block left    Dr. Rollene Fare is cardiologist   Elevated cholesterol    Hypertension    Osteopenia 01/2019   T score -1.8   Pacemaker    Paroxysmal atrial fibrillation ([place])    rrare episodes on pacemaker interrogation   Wears glasses     Past Surgical History:  Procedure Laterality Date   CARDIAC CATHETERIZATION  2005   Ejection fraction is estimated at  greater than or equal to 60 %  Wall motion is normal.   CAROTID DUPLEX BILATERAL  march 08,2005   Impression : No evidence of of signficant plaque formation or stenosis.   CARPAL TUNNEL RELEASE  04/30/2011   Procedure: CARPAL TUNNEL RELEASE;  Surgeon: Cammie Sickle., MD;  Location: [facility];  Service: Orthopedics;  Laterality: Left;  left carpal tunnel release, inject left thumb   CARPAL TUNNEL RELEASE  04/28/2012   Procedure: CARPAL TUNNEL RELEASE;  Surgeon: Cammie Sickle., MD;  Location: [facility];  Service: Orthopedics;  Laterality: Right;  CATARACT EXTRACTION W/PHACO Right 03/01/2022   Procedure: CATARACT EXTRACTION PHACO AND INTRAOCULAR LENS PLACEMENT (IOC);  Surgeon: Baruch Goldmann, MD;  [facility];  Service: Ophthalmology;  Laterality: Right;  CDE: 3.75   CATARACT EXTRACTION W/PHACO Left 03/26/2022   Procedure: CATARACT EXTRACTION PHACO AND INTRAOCULAR  LENS PLACEMENT (IOC);  Surgeon: Baruch Goldmann, MD;  [facility]: [facility];  Service: Ophthalmology;  Laterality: Left;  CDE 8.45   COLONOSCOPY     COLONOSCOPY  04/14/2012   Procedure: COLONOSCOPY;  Surgeon: Daneil Dolin, MD;  [facility];  Service: Endoscopy;  Laterality: N/A;  8:30 AM   DORSAL COMPARTMENT RELEASE  04/30/2011   Procedure: RELEASE DORSAL COMPARTMENT (DEQUERVAIN);  Surgeon: Cammie Sickle., MD;  Location: [facility];  Service: Orthopedics;  Laterality: Left;  release 1st dorsal compartment on left   INSERT / REPLACE / REMOVE PACEMAKER  07/29/2009   PPM-Dual Chamber Systems Fluoro Guidance Venogram performed 10 ml 's to the left subclavian vein   PACEMAKER INSERTION  07/2009   Dr. Rollene Fare  Pacemaker is a Adapta L implanted 07/2009   RENAL CIRCULATION  07/ 08/ 08   normal renal duplex doppler    TRANSTHORACIC ECHOCARDIOGRAM  10/18/2012   Atrial tracking & V paced rhythm with pacer induced LBBB. No significant intra -ventricular  dyssynchrony.   TUBAL LIGATION  1986    Current Medications: Outpatient Medications Prior to Visit  Medication Sig Dispense Refill   aspirin-acetaminophen-caffeine (HEADACHE RELIEF) 250-250-65 MG tablet      Calcium Carb-Cholecalciferol (CALCIUM-VITAMIN D) 600-400 MG-UNIT TABS Take 1 tablet by mouth 2 (two) times daily.     escitalopram (LEXAPRO) 10 MG tablet 5 mg. Twice weekly     lisinopril (ZESTRIL) 20 MG tablet Take 20 mg by mouth daily.     lisinopril-hydrochlorothiazide (ZESTORETIC) 20-12.5 MG tablet Take 1 tablet by mouth daily.     metoprolol succinate (TOPROL-XL) 50 MG 24 hr tablet TAKE 1 TABLET BY MOUTH ONCE DAILY WITH OR IMMEDIATELY FOLLOWING A MEAL 90 tablet 3   Multiple Vitamins-Minerals (WOMENS 50+ MULTI VITAMIN/MIN PO) Take 1 tablet by mouth daily.     Omega-3 Fatty Acids (OMEGA-3 FISH OIL) 500 MG CAPS Take 2 capsules by mouth daily in the afternoon.     OVER THE COUNTER MEDICATION Take 2 tablets by mouth  daily. Med Name: Mega Red fish oil,krill oil - JOINT CARE     rivaroxaban (XARELTO) 20 MG TABS tablet TAKE 1 TABLET BY MOUTH ONCE DAILY WITH SUPPER 30 tablet 5   rosuvastatin (CRESTOR) 10 MG tablet Take 1 tablet by mouth once daily 90 tablet 3   VITAMIN D PO Take 1,000 Units by mouth daily in the afternoon.     acetaminophen (TYLENOL) 650 MG CR tablet Take 650 mg by mouth every 8 (eight) hours as needed for pain. (Patient not taking: Reported on 04/12/2022)     meloxicam (MOBIC) 15 MG tablet Take 15 mg by mouth as needed. (Patient not taking: Reported on 04/12/2022)     methimazole (TAPAZOLE) 5 MG tablet Take 1 tablet (5 mg total) by mouth once a week. (Patient not taking: Reported on 12/17/2021) 15 tablet 3   No facility-administered medications prior to visit.     Allergies:   Vicodin [hydrocodone-acetaminophen] and Atorvastatin   Social History   Socioeconomic History   Marital status: Married    Spouse name: Not on file   Number of children: Not on file   Years of education: Not on  file   Highest education level: Not on file  Occupational History   Not on file  Tobacco Use   Smoking status: Former    Packs/day: 1.50    Years: 30.00    Total pack years: 45.00    Types: Cigarettes   Smokeless tobacco: Never  Vaping Use   Vaping Use: Never used  Substance and Sexual Activity   Alcohol use: Yes    Alcohol/week: 7.0 standard drinks of alcohol    Types: 7 Glasses of wine per week   Drug use: No   Sexual activity: Not Currently    Birth control/protection: Post-menopausal, Surgical    Comment: Tubal lig-1st intercourse 71 yo-Fewer than 5 partners  Other Topics Concern   Not on file  Social History Narrative   Not on file   Social Determinants of Health   Financial Resource Strain: Not on file  Food Insecurity: Not on file  Transportation Needs: Not on file  Physical Activity: Not on file  Stress: Not on file  Social Connections: Not on file     Family History:  The  patient's family history includes Colon cancer in her maternal aunt; Diabetes in her father; Heart disease in her mother and paternal grandfather.   ROS:   Please see the history of present illness.    ROS All other systems are reviewed and are negative.   PHYSICAL EXAM:   VS:  BP 132/78 (BP Location: Left Arm, Patient Position: Sitting, Cuff Size: Normal)   Pulse 71   Ht '5\' 1"'$  (1.549 m)   Wt 61.7 kg   SpO2 94%   BMI 25.70 kg/m      General: Alert, oriented x3, no distress, well-healed left subclavian pacemaker site.  Appears younger than stated age. Head: no evidence of trauma, PERRL, EOMI, no exophtalmos or lid lag, no myxedema, no xanthelasma; normal ears, nose and oropharynx Neck: normal jugular venous pulsations and no hepatojugular reflux; brisk carotid pulses without delay and no carotid bruits Chest: clear to auscultation, no signs of consolidation by percussion or palpation, normal fremitus, symmetrical and full respiratory excursions Cardiovascular: normal position and quality of the apical impulse, regular rhythm, normal first and paradoxically split second heart sounds, no murmurs, rubs or gallops Abdomen: no tenderness or distention, no masses by palpation, no abnormal pulsatility or arterial bruits, normal bowel sounds, no hepatosplenomegaly Extremities: no clubbing, cyanosis or edema; 2+ radial, ulnar and brachial pulses bilaterally; 2+ right femoral, posterior tibial and dorsalis pedis pulses; 2+ left femoral, posterior tibial and dorsalis pedis pulses; no subclavian or femoral bruits Neurological: grossly nonfocal Psych: Normal mood and affect     Wt Readings from Last 3 Encounters:  04/12/22 61.7 kg  03/26/22 66 kg  03/19/22 66 kg      Studies/Labs Reviewed:   EKG: Ordered today shows A sensed, V paced rhythm.  QTc 517 ms  Recent Labs: 12/17/2021: TSH 2.51   Lipid Panel    Component Value Date/Time   CHOL 187 04/28/2020 0813   TRIG 154 (H) 04/28/2020  0813   HDL 55 04/28/2020 0813   CHOLHDL 3.4 04/28/2020 0813   CHOLHDL 3.3 03/28/2015 0859   VLDL 27 03/28/2015 0859   LDLCALC 105 (H) 04/28/2020 0813    April 17, 2019 Total cholesterol 171, HDL 55, LDL 88, triglycerides 162 Hemoglobin A1c 5.5% Hemoglobin 14.4, creatinine 0.87, potassium 5.3, normal liver function tests TSH 3.32 on 12/18/2019  07/11/2021 Cholesterol 151, HDL 55, LDL 73, triglycerides 130 Hemoglobin A1c 5.8% Hemoglobin  13.0, creatinine 0.89, potassium 4.3, ALT 25  12/09/2021 TSH 2.510 ASSESSMENT:    1. CHB (complete heart block) ([place])   2. Pacemaker   3. Paroxysmal atrial fibrillation ([facility])   4. Acquired thrombophilia ([place])   5. Essential hypertension   6. Hypercholesterolemia   7. Hyperthyroidism      PLAN:  In order of problems listed above:  CHB: She does not have an escape rhythm.  She is pacemaker dependent. PPM: Normal device function.  Remote downloads every 3 months and yearly office visits.  She has Medtronic 5076 leads at 1 time comes for generator change out she will receive a MRI conditional generator and the entire system should be MRI conditional. AFib: The episodes of atrial fibrillation remain rare, but they can be likely lasting over 7 hours. Overall prevalence 0.4 %. Asymptomatic. CHADVasc score of 3 (age, gender, HTN). Xarelto: No bleeding or falls.  Can consider switching to Eliquis if GI bleeding becomes an issue. HTN: Well-controlled. HLP: All lipid parameters are in target range. Hyperthyroidism:  Methimazole was stopped a few months ago since it did not appear to be necessary anymore. Followed by Dr. Kelton Pillar.  Has an appointment in December.  Clinically euthyroid.  Does not appear that her anxiety is related either to atrial fibrillation or to hypothyroidism.    Medication Adjustments/Labs and Tests Ordered: Current medicines are reviewed at length with the patient today.  Concerns regarding medicines are outlined above.   Medication changes, Labs and Tests ordered today are listed in the Patient Instructions below. Patient Instructions  Medication Instructions:  No changes *If you need a refill on your cardiac medications before your next appointment, please call your pharmacy*   Lab Work: None ordered If you have labs (blood work) drawn today and your tests are completely normal, you will receive your results only by: [place] (if you have MyChart) OR A paper copy in the mail If you have any lab test that is abnormal or we need to change your treatment, we will call you to review the results.   Testing/Procedures: None ordered   Follow-Up: At [facility], you and your health needs are our priority.  As part of our continuing mission to provide you with exceptional heart care, we have created designated Provider Care Teams.  These Care Teams include your primary Cardiologist (physician) and Advanced Practice Providers (APPs -  Physician Assistants and Nurse Practitioners) who all work together to provide you with the care you need, when you need it.  We recommend signing up for the patient portal called "MyChart".  Sign up information is provided on this After Visit Summary.  MyChart is used to connect with patients for Virtual Visits (Telemedicine).  Patients are able to view lab/test results, encounter notes, upcoming appointments, etc.  Non-urgent messages can be sent to your provider as well.   To learn more about what you can do with MyChart, go to [email or web address].    Your next appointment:   12 month(s)  The format for your next appointment:   In Person  Provider:   Dr. Sallyanne Kuster     Signed, Sanda Klein, MD  04/14/2022 2:54 PM    [address], [place], [place]  [address] Phone: [phone]; Fax: [phone]

## 2022-04-12 NOTE — Patient Instructions (Signed)
Medication Instructions:  No changes *If you need a refill on your cardiac medications before your next appointment, please call your pharmacy*   Lab Work: None ordered If you have labs (blood work) drawn today and your tests are completely normal, you will receive your results only by: [place] (if you have MyChart) OR A paper copy in the mail If you have any lab test that is abnormal or we need to change your treatment, we will call you to review the results.   Testing/Procedures: None ordered   Follow-Up: At [facility], you and your health needs are our priority.  As part of our continuing mission to provide you with exceptional heart care, we have created designated Provider Care Teams.  These Care Teams include your primary Cardiologist (physician) and Advanced Practice Providers (APPs -  Physician Assistants and Nurse Practitioners) who all work together to provide you with the care you need, when you need it.  We recommend signing up for the patient portal called "MyChart".  Sign up information is provided on this After Visit Summary.  MyChart is used to connect with patients for Virtual Visits (Telemedicine).  Patients are able to view lab/test results, encounter notes, upcoming appointments, etc.  Non-urgent messages can be sent to your provider as well.   To learn more about what you can do with MyChart, go to [email or web address].    Your next appointment:   12 month(s)  The format for your next appointment:   In Person  Provider:   Dr. Sallyanne Kuster

## 2022-04-29 ENCOUNTER — Ambulatory Visit (HOSPITAL_COMMUNITY)
Admission: RE | Admit: 2022-04-29 | Discharge: 2022-04-29 | Disposition: A | Discharge status: Home / Self Care | Payer: Medicare HMO | Source: Ambulatory Visit | Attending: Family Medicine | Admitting: Family Medicine

## 2022-04-29 DIAGNOSIS — Z1231 Encounter for screening mammogram for malignant neoplasm of breast: Secondary | ICD-10-CM | POA: Diagnosis not present

## 2022-06-01 ENCOUNTER — Encounter: Payer: Self-pay | Admitting: Internal Medicine

## 2022-06-01 ENCOUNTER — Ambulatory Visit: Payer: Medicare HMO | Admitting: Internal Medicine

## 2022-06-01 VITALS — BP 118/72 | HR 67 | Wt 136.4 lb

## 2022-06-01 DIAGNOSIS — E0590 Thyrotoxicosis, unspecified without thyrotoxic crisis or storm: Secondary | ICD-10-CM

## 2022-06-01 LAB — TSH: TSH: 3.35 u[IU]/mL (ref 0.35–5.50)

## 2022-06-01 LAB — T4, FREE: Free T4: 0.82 ng/dL (ref 0.60–1.60)

## 2022-06-01 NOTE — Progress Notes (Unsigned)
Name: [name]  MRN/ DOB: [id number], 03-07-1951    Age/ Sex: 71 y.o., female     PCP: Sharilyn Sites, MD   Reason for Endocrinology Evaluation: Hyperthyroidism     Initial Endocrinology Clinic Visit: 04/14/2016    PATIENT IDENTIFIER: [name] is a 71 y.o., female with a past medical history of HTN, PAF and hyperhtyoidism. She has followed with [facility] since 04/14/2016 for consultative assistance with management of her Hyperthyroidism.   HISTORICAL SUMMARY: The patient was first diagnosed with Hyperthyroidism in 2017.   She had declined RAI through her previous endocrinoloogist   She has been on Methimazole for years until April, 2023 when she stopped it.    Repeat TFT's in 11/2021 showed normalization and we opted to remain off methimazole    No Amiodarone intake     No FH of thyroid disease  SUBJECTIVE:    Today (06/01/2022):  [name] is here for hyperthyroidism.   She has been noted with weight loss since her last visit here.  Has occasional palpitations due to A. Fib  Has noted intermittent loose stools Denies local neck swelling  Has noted tremors  Has noted anxiety , on citalopram      HISTORY:  Past Medical History:  Past Medical History:  Diagnosis Date   Arthritis    Atrial tachycardia    Atypical chest pain    Bundle branch block left    Dr. Rollene Fare is cardiologist   Elevated cholesterol    Hypertension    Osteopenia 01/2019   T score -1.8   Pacemaker    Paroxysmal atrial fibrillation ([place])    rrare episodes on pacemaker interrogation   Wears glasses    Past Surgical History:  Past Surgical History:  Procedure Laterality Date   CARDIAC CATHETERIZATION  2005   Ejection fraction is estimated at  greater than or equal to 60 %  Wall motion is normal.   CAROTID DUPLEX BILATERAL  march 08,2005   Impression : No evidence of of signficant plaque formation or stenosis.   CARPAL TUNNEL RELEASE  04/30/2011    Procedure: CARPAL TUNNEL RELEASE;  Surgeon: Cammie Sickle., MD;  Location: [place];  Service: Orthopedics;  Laterality: Left;  left carpal tunnel release, inject left thumb   CARPAL TUNNEL RELEASE  04/28/2012   Procedure: CARPAL TUNNEL RELEASE;  Surgeon: Cammie Sickle., MD;  Location: [facility];  Service: Orthopedics;  Laterality: Right;   CATARACT EXTRACTION W/PHACO Right 03/01/2022   Procedure: CATARACT EXTRACTION PHACO AND INTRAOCULAR LENS PLACEMENT (IOC);  Surgeon: Baruch Goldmann, MD;  [facility];  Service: Ophthalmology;  Laterality: Right;  CDE: 3.75   CATARACT EXTRACTION W/PHACO Left 03/26/2022   Procedure: CATARACT EXTRACTION PHACO AND INTRAOCULAR LENS PLACEMENT (IOC);  Surgeon: Baruch Goldmann, MD;  [facility];  Service: Ophthalmology;  Laterality: Left;  CDE 8.45   COLONOSCOPY     COLONOSCOPY  04/14/2012   Procedure: COLONOSCOPY;  Surgeon: Daneil Dolin, MD;  [facility];  Service: Endoscopy;  Laterality: N/A;  8:30 AM   DORSAL COMPARTMENT RELEASE  04/30/2011   Procedure: RELEASE DORSAL COMPARTMENT (DEQUERVAIN);  Surgeon: Cammie Sickle., MD;  Location: [facility];  Service: Orthopedics;  Laterality: Left;  release 1st dorsal compartment on left   INSERT / REPLACE / REMOVE PACEMAKER  07/29/2009   PPM-Dual Chamber Systems Fluoro Guidance Venogram performed 10 ml 's to the left  subclavian vein   PACEMAKER INSERTION  07/2009   Dr. Rollene Fare  Pacemaker is a Adapta L implanted 07/2009   RENAL CIRCULATION  07/ 08/ 08   normal renal duplex doppler    TRANSTHORACIC ECHOCARDIOGRAM  10/18/2012   Atrial tracking & V paced rhythm with pacer induced LBBB. No significant intra -ventricular  dyssynchrony.   TUBAL LIGATION  1986   Social History:  reports that she has quit smoking. Her smoking use included cigarettes. She has a 45.00 pack-year smoking history. She has never used smokeless tobacco. She reports current  alcohol use of about 7.0 standard drinks of alcohol per week. She reports that she does not use drugs. Family History:  Family History  Problem Relation Age of Onset   Heart disease Mother    Diabetes Father    Heart disease Paternal Grandfather    Colon cancer Maternal Aunt    Thyroid disease Neg Hx      HOME MEDICATIONS: Allergies as of 06/01/2022       Reactions   Vicodin [hydrocodone-acetaminophen] Nausea Only   Atorvastatin Other (See Comments)   Muscle/joint pain        Medication List        Accurate as of June 01, 2022 10:01 AM. If you have any questions, ask your nurse or doctor.          acetaminophen 650 MG CR tablet Commonly known as: TYLENOL Take 650 mg by mouth every 8 (eight) hours as needed for pain.   Calcium-Vitamin D 600-400 MG-UNIT Tabs Take 1 tablet by mouth 2 (two) times daily.   escitalopram 10 MG tablet Commonly known as: LEXAPRO Take 5 mg by mouth daily. Twice weekly   Headache Relief 250-250-65 MG tablet Generic drug: aspirin-acetaminophen-caffeine   lisinopril 20 MG tablet Commonly known as: ZESTRIL Take 20 mg by mouth daily.   lisinopril-hydrochlorothiazide 20-12.5 MG tablet Commonly known as: ZESTORETIC Take 1 tablet by mouth daily.   meloxicam 15 MG tablet Commonly known as: MOBIC Take 15 mg by mouth as needed.   metoprolol succinate 50 MG 24 hr tablet Commonly known as: TOPROL-XL TAKE 1 TABLET BY MOUTH ONCE DAILY WITH OR IMMEDIATELY FOLLOWING A MEAL   Omega-3 Fish Oil 500 MG Caps Take 2 capsules by mouth daily in the afternoon.   OVER THE COUNTER MEDICATION Take 2 tablets by mouth daily. Med Name: Mega Red fish oil,krill oil - JOINT CARE   rosuvastatin 10 MG tablet Commonly known as: CRESTOR Take 1 tablet by mouth once daily   VITAMIN D PO Take 1,000 Units by mouth daily in the afternoon.   WOMENS 50+ MULTI VITAMIN/MIN PO Take 1 tablet by mouth daily.   Xarelto 20 MG Tabs tablet Generic drug:  rivaroxaban TAKE 1 TABLET BY MOUTH ONCE DAILY WITH SUPPER          OBJECTIVE:   PHYSICAL EXAM: VS: BP 118/72 (BP Location: Left Arm, Patient Position: Sitting, Cuff Size: Small)   Pulse 67   Wt 136 lb 6.4 oz (61.9 kg)   SpO2 97%   BMI 25.77 kg/m    EXAM: General: Pt appears well and is in NAD  Neck: General: Supple without adenopathy. Thyroid: Thyroid size normal.  No goiter or nodules appreciated.   Lungs: Clear with good BS bilat with no rales, rhonchi, or wheezes  Heart: Auscultation: RRR.  Abdomen: Normoactive bowel sounds, soft, nontender, without masses or organomegaly palpable  Extremities:  BL LE: No pretibial edema normal ROM and strength.  Mental Status: Judgment, insight: Intact Orientation: Oriented to time, place, and person Mood and affect: No depression, anxiety, or agitation     DATA REVIEWED:    Latest Reference Range & Units 06/01/22 10:09  TSH 0.35 - 5.50 uIU/mL 3.35  Triiodothyronine (T3) 76 - 181 ng/dL 108  T4,Free(Direct) 0.60 - 1.60 ng/dL 0.82      ASSESSMENT / PLAN / RECOMMENDATIONS:   Hyperthyroidism   - Clinical scenario consistent with Graves' disease  - She has been off Methimazole since ~ 2023 - I have recommended proceeding with thyroid ultrasound should she become hyperthyroidism again but she would like to postpone this until early next year  - Repeat TFT's are normal    F/U  in 6 months   Signed electronically by: Mack Guise, MD  [facility]  [address] [place],  [address] Phone: [phone] FAX: [phone]      CC: Sharilyn Sites, [place] [address] Phone: [phone]  Fax: [phone]   Return to Endocrinology clinic as below: Future Appointments  Date Time Provider [place]  06/07/2022  9:50 AM [facility] DEVICE REMOTES [facility]  09/06/2022  9:50 AM [facility] DEVICE REMOTES  [facility]  12/06/2022  9:50 AM [facility] DEVICE REMOTES [facility]  03/07/2023  9:50 AM [facility] DEVICE REMOTES [facility]  06/06/2023  9:50 AM [facility] DEVICE REMOTES [facility]  09/05/2023  9:50 AM [facility] DEVICE REMOTES [facility]  12/05/2023  9:50 AM [facility] DEVICE REMOTES [facility]

## 2022-06-02 LAB — T3: T3, Total: 108 ng/dL (ref 76–181)

## 2022-06-07 ENCOUNTER — Ambulatory Visit (INDEPENDENT_AMBULATORY_CARE_PROVIDER_SITE_OTHER): Payer: Medicare HMO

## 2022-06-07 DIAGNOSIS — Z950 Presence of cardiac pacemaker: Secondary | ICD-10-CM

## 2022-06-08 ENCOUNTER — Telehealth: Payer: Self-pay

## 2022-06-08 LAB — CUP PACEART REMOTE DEVICE CHECK
Battery Impedance: 5239 Ohm
Battery Remaining Longevity: 7 mo
Battery Voltage: 2.66 V
Brady Statistic AP VP Percent: 11 %
Brady Statistic AP VS Percent: 0 %
Brady Statistic AS VP Percent: 88 %
Brady Statistic AS VS Percent: 0 %
Date Time Interrogation Session: 20231218074033
Implantable Lead Connection Status: 753985
Implantable Lead Connection Status: 753985
Implantable Lead Implant Date: 20110208
Implantable Lead Implant Date: 20110208
Implantable Lead Location: 753859
Implantable Lead Location: 753860
Implantable Lead Model: 5076
Implantable Lead Model: 5076
Implantable Pulse Generator Implant Date: 20110208
Lead Channel Impedance Value: 420 Ohm
Lead Channel Impedance Value: 510 Ohm
Lead Channel Pacing Threshold Amplitude: 0.375 V
Lead Channel Pacing Threshold Amplitude: 0.75 V
Lead Channel Pacing Threshold Pulse Width: 0.4 ms
Lead Channel Pacing Threshold Pulse Width: 0.4 ms
Lead Channel Setting Pacing Amplitude: 1.5 V
Lead Channel Setting Pacing Amplitude: 2.25 V
Lead Channel Setting Pacing Pulse Width: 0.4 ms
Lead Channel Setting Sensing Sensitivity: 2 mV
Zone Setting Status: 755011
Zone Setting Status: 755011

## 2022-06-08 NOTE — Telephone Encounter (Signed)
Following alert received from [employer] received for Scheduled remote reviewed. Normal device function.   Persistent AF/AFL since 12/9, controlled rates - route to triage per protocol. Burden 15.3%, hx of PAF, Xarelto '20mg'$ .  Recent OV 03/2022 and AF burden was noted 0.4%.   Attempted to contact patient to assess for symptoms. No answer, LMTCB.

## 2022-06-08 NOTE — Telephone Encounter (Signed)
Patient reports intermittent palpitations on and off for sometime. Unable to notice any significant changes with persistent onset. Patient also reports missing doses of Why due to not remembering if she took it or not. Encouraged patient to try to stay compliant. Advised patient I will forward to Dr. Sallyanne Kuster and will follow up if any changes warranted. Pt voiced understanding.

## 2022-06-09 ENCOUNTER — Encounter: Payer: Self-pay | Admitting: Cardiovascular Disease

## 2022-06-09 ENCOUNTER — Ambulatory Visit: Payer: Medicare HMO | Attending: Cardiovascular Disease | Admitting: Cardiovascular Disease

## 2022-06-09 ENCOUNTER — Other Ambulatory Visit: Payer: Self-pay

## 2022-06-09 ENCOUNTER — Encounter: Payer: Self-pay | Admitting: *Deleted

## 2022-06-09 VITALS — Ht 61.0 in | Wt 137.0 lb

## 2022-06-09 DIAGNOSIS — I480 Paroxysmal atrial fibrillation: Secondary | ICD-10-CM | POA: Diagnosis not present

## 2022-06-09 DIAGNOSIS — Z7901 Long term (current) use of anticoagulants: Secondary | ICD-10-CM

## 2022-06-09 DIAGNOSIS — E0590 Thyrotoxicosis, unspecified without thyrotoxic crisis or storm: Secondary | ICD-10-CM | POA: Diagnosis not present

## 2022-06-09 NOTE — Patient Instructions (Addendum)
Medication Instructions:  Your physician recommends that you continue on your current medications as directed. Please refer to the Current Medication list given to you today.  *If you need a refill on your cardiac medications before your next appointment, please call your pharmacy*   Lab Work: Pre cardioversion BMET and CBC  If you have labs (blood work) drawn today and your tests are completely normal, you will receive your results only by: [place] (if you have MyChart) OR A paper copy in the mail If you have any lab test that is abnormal or we need to change your treatment, we will call you to review the results.   Testing/Procedures:    Dear [name]  You are scheduled for a Cardioversion on Friday, January 12 with Dr. Sallyanne Kuster.  Please arrive at the [facility] (Main Entrance A) at [facility]: [address] [place], [place] [address] at 8:30 AM.   DIET:  Nothing to eat or drink after midnight except a sip of water with medications (see medication instructions below)  MEDICATION INSTRUCTIONS: Continue taking your anticoagulant (blood thinner): Rivaroxaban (Xarelto).  You will need to continue this after your procedure until you are told by your provider that it is safe to stop.    LABS:  Come to the lab at [facility] office for a BMET and CBC  FYI:  For your safety, and to allow [place] to monitor your vital signs accurately during the surgery/procedure we request: If you have artificial nails, gel coating, SNS etc, please have those removed prior to your surgery/procedure. Not having the nail coverings /polish removed may result in cancellation or delay of your surgery/procedure.  You must have a responsible person to drive you home and stay in the waiting area during your procedure. Failure to do so could result in cancellation.  Bring your insurance cards.  *Special Note: Every effort is made to have your procedure done on time.  Occasionally there are emergencies that occur at the hospital that may cause delays. Please be patient if a delay does occur.      Follow-Up: At [facility], you and your health needs are our priority.  As part of our continuing mission to provide you with exceptional heart care, we have created designated Provider Care Teams.  These Care Teams include your primary Cardiologist (physician) and Advanced Practice Providers (APPs -  Physician Assistants and Nurse Practitioners) who all work together to provide you with the care you need, when you need it.  Your next appointment:   3 month(s)  The format for your next appointment:   In Person  Provider:   Sanda Klein, MD    Important Information About [place]

## 2022-06-09 NOTE — Progress Notes (Signed)
Virtual Visit via Video Note   Because of [name]'s co-morbid illnesses, she is at least at moderate risk for complications without adequate follow up.  This format is felt to be most appropriate for this patient at this time.  All issues noted in this document were discussed and addressed.  A limited physical exam was performed with this format.  Please refer to the patient's chart for her consent to telehealth for [place].       Date:  06/09/2022   ID:  [name], DOB 07/20/1950, MRN [id number] The patient was identified using 2 identifiers.  Patient Location: Home Provider Location: Office/Clinic   PCP:  Sharilyn Sites, [place] Providers Cardiologist:  Sanda Klein, MD     Evaluation Performed:  Follow-Up Visit  Chief Complaint:  atrial fibrillation   History of Present Illness:    [name] is a 71 y.o. female with with complete heart block, paroxysmal atrial fibrillation, hypertension, hyperlipidemia this pacemaker has recently reported a lengthy episode of persistent atrial fibrillation, now ongoing for about 10 days.  Her device was implanted for symptomatic bradycardia and she has a history of left bundle branch block. She subsequently has developed complete heart block and is now pacemaker dependent. Her Medtronic Adapta device was implanted in 2011. Subsequent to the pacemaker implantation she developed episodes of infrequent paroxysmal atrial fibrillation that have been consistently asymptomatic. 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 The episodes of atrial fibrillation were not clearly associated with a thyroid disorder and have occurred even when she became euthyroid. She has well-controlled hypertension and takes a statin for hyperlipidemia, with labs monitored via Dr. Hilma Favors. She had a normal nuclear perfusion stress test in May 2017   She is only mildly symptomatic,  has noticed maybe some palpitations.  She is currently due to financial issues with the "donut hole" she has only been taking the Xarelto intermittently, on the average once a week.  She has not had any bleeding problems or falls.  She denies any focal neurological events or history of stroke or TIA.  The patient specifically denies any chest pain at rest exertion, dyspnea at rest or with exertion, orthopnea, paroxysmal nocturnal dyspnea, syncope, palpitations, focal neurological deficits, intermittent claudication, lower extremity edema, unexplained weight gain, cough, hemoptysis or wheezing.  Pacemaker function is otherwise normal.  She is pacemaker dependent due to complete heart block.  Prior to onset of atrial fibrillation she has roughly 8% atrial pacing and 99.6% ventricular pacing.  Until this recent event her burden of paroxysmal atrial fibrillation was low at only 0.4%.  Lead parameters and generator function appear normal.  Estimated Medtronic Adapta generator longevity is about a year.  The leads are Medtronic 5076 leads.  Past Medical History:  Diagnosis Date   Arthritis    Atrial tachycardia    Atypical chest pain    Bundle branch block left    Dr. Rollene Fare is cardiologist   Elevated cholesterol    Hypertension    Osteopenia 01/2019   T score -1.8   Pacemaker    Paroxysmal atrial fibrillation ([place])    rrare episodes on pacemaker interrogation   Wears glasses    Past Surgical History:  Procedure Laterality Date   CARDIAC CATHETERIZATION  2005   Ejection fraction is estimated at  greater than or equal to 60 %  Wall motion is normal.   CAROTID DUPLEX BILATERAL  march  [id number]   Impression : No evidence of of signficant plaque formation or stenosis.   CARPAL TUNNEL RELEASE  04/30/2011   Procedure: CARPAL TUNNEL RELEASE;  Surgeon: Cammie Sickle., MD;  Location: [facility];  Service: Orthopedics;  Laterality: Left;  left carpal tunnel release, inject left thumb    CARPAL TUNNEL RELEASE  04/28/2012   Procedure: CARPAL TUNNEL RELEASE;  Surgeon: Cammie Sickle., MD;  Location: [facility];  Service: Orthopedics;  Laterality: Right;   CATARACT EXTRACTION W/PHACO Right 03/01/2022   Procedure: CATARACT EXTRACTION PHACO AND INTRAOCULAR LENS PLACEMENT (IOC);  Surgeon: Baruch Goldmann, MD;  Location: [facility];  Service: Ophthalmology;  Laterality: Right;  CDE: 3.75   CATARACT EXTRACTION W/PHACO Left 03/26/2022   Procedure: CATARACT EXTRACTION PHACO AND INTRAOCULAR LENS PLACEMENT (IOC);  Surgeon: Baruch Goldmann, MD;  Location: [facility];  Service: Ophthalmology;  Laterality: Left;  CDE 8.45   COLONOSCOPY     COLONOSCOPY  04/14/2012   Procedure: COLONOSCOPY;  Surgeon: Daneil Dolin, MD;  [facility];  Service: Endoscopy;  Laterality: N/A;  8:30 AM   DORSAL COMPARTMENT RELEASE  04/30/2011   Procedure: RELEASE DORSAL COMPARTMENT (DEQUERVAIN);  Surgeon: Cammie Sickle., MD;  Location: [facility];  Service: Orthopedics;  Laterality: Left;  release 1st dorsal compartment on left   INSERT / REPLACE / REMOVE PACEMAKER  07/29/2009   PPM-Dual Chamber Systems Fluoro Guidance Venogram performed 10 ml 's to the left subclavian vein   PACEMAKER INSERTION  07/2009   Dr. Rollene Fare  Pacemaker is a Adapta L implanted 07/2009   RENAL CIRCULATION  07/ 08/ 08   normal renal duplex doppler    TRANSTHORACIC ECHOCARDIOGRAM  10/18/2012   Atrial tracking & V paced rhythm with pacer induced LBBB. No significant intra -ventricular  dyssynchrony.   TUBAL LIGATION  1986     Current Meds  Medication Sig   acetaminophen (TYLENOL) 650 MG CR tablet Take 650 mg by mouth every 8 (eight) hours as needed for pain.   Calcium Carb-Cholecalciferol (CALCIUM-VITAMIN D) 600-400 MG-UNIT TABS Take 1 tablet by mouth 2 (two) times daily.   escitalopram (LEXAPRO) 10 MG tablet Take 5 mg by mouth daily. Twice weekly   lisinopril (ZESTRIL) 20 MG tablet Take 20 mg by  mouth daily.   lisinopril-hydrochlorothiazide (ZESTORETIC) 20-12.5 MG tablet Take 1 tablet by mouth daily.   metoprolol succinate (TOPROL-XL) 50 MG 24 hr tablet TAKE 1 TABLET BY MOUTH ONCE DAILY WITH OR IMMEDIATELY FOLLOWING A MEAL   Multiple Vitamins-Minerals (WOMENS 50+ MULTI VITAMIN/MIN PO) Take 1 tablet by mouth daily.   Omega-3 Fatty Acids (OMEGA-3 FISH OIL) 500 MG CAPS Take 2 capsules by mouth daily in the afternoon.   rivaroxaban (XARELTO) 20 MG TABS tablet TAKE 1 TABLET BY MOUTH ONCE DAILY WITH SUPPER   rosuvastatin (CRESTOR) 10 MG tablet Take 1 tablet by mouth once daily   VITAMIN D PO Take 1,000 Units by mouth daily in the afternoon.     Allergies:   Vicodin [hydrocodone-acetaminophen] and Atorvastatin   Social History   Tobacco Use   Smoking status: Former    Packs/day: 1.50    Years: 30.00    Total pack years: 45.00    Types: Cigarettes   Smokeless tobacco: Never  Vaping Use   Vaping Use: Never used  Substance Use Topics   Alcohol use: Yes    Alcohol/week: 7.0 standard drinks of alcohol    Types: 7 Glasses  of wine per week   Drug use: No     Family Hx: The patient's family history includes Colon cancer in her maternal aunt; Diabetes in her father; Heart disease in her mother and paternal grandfather. There is no history of Thyroid disease.  ROS:   Please see the history of present illness.     All other systems reviewed and are negative.   Prior CV studies:   The following studies were reviewed today:  Comprehensive pacemaker interrogation.  Labs/Other Tests and Data Reviewed:    EKG:   Intracardiac electrogram shows atrial fibrillation at 100% ventricular pacing.  Recent Labs: 06/01/2022: TSH 3.35   Recent Lipid Panel Lab Results  Component Value Date/Time   CHOL 187 04/28/2020 08:13 AM   TRIG 154 (H) 04/28/2020 08:13 AM   HDL 55 04/28/2020 08:13 AM   CHOLHDL 3.4 04/28/2020 08:13 AM   CHOLHDL 3.3 03/28/2015 08:59 AM   LDLCALC 105 (H) 04/28/2020  08:13 AM    Wt Readings from Last 3 Encounters:  06/09/22 137 lb (62.1 kg)  06/01/22 136 lb 6.4 oz (61.9 kg)  04/12/22 136 lb (61.7 kg)     Risk Assessment/Calculations:    CHA2DS2-VASc Score = 3   This indicates a 3.2% annual risk of stroke. The patient's score is based upon: CHF History: 0 HTN History: 1 Diabetes History: 0 Stroke History: 0 Vascular Disease History: 0 Age Score: 1 Gender Score: 1         Objective:    Vital Signs:  Ht '5\' 1"'$  (1.549 m)   Wt 137 lb (62.1 kg)   BMI 25.89 kg/m    VITAL SIGNS:  reviewed GEN:  no acute distress EYES:  sclerae anicteric, EOMI - Extraocular Movements Intact RESPIRATORY:  normal respiratory effort, symmetric expansion CARDIOVASCULAR:  no peripheral edema SKIN:  no rash, lesions or ulcers. MUSCULOSKELETAL:  no obvious deformities. NEURO:  alert and oriented x 3, no obvious focal deficit PSYCH:  normal affect  ASSESSMENT & PLAN:    Persistent atrial fibrillation: Only mildly symptomatic, but this is her first episode of persistent arrhythmia.  Discussed the pros and cons of cardioversion, possible complications, limitations of antiarrhythmic medications, importance of uninterrupted anticoagulation for the weeks leading up to and following the cardioversion as well as importance of long-term anticoagulation for stroke prevention.  She has started taking her rivaroxaban on a daily basis starting yesterday and has enough medications lasted for about a week.  We have some samples waiting for her at the office.  Will simultaneously plan to get patient assistance for an appropriate oral anticoagulant in view of this issue long-term.  CHB: Pacemaker dependent. Pacemaker: Roughly 1 year of generator longevity remains..  Her leads are Medtronic 5076.  When we performed a generator change in about a year she will have an MRI conditional system. Anticoagulation: Well-tolerated without bleeding complications.  Need to make sure she gets  uninterrupted access to oral anticoagulants, whether this means applying for a pharmaceutical company assistance or switching to generic dabigatran down the road. HTN: She was unable to check her blood pressure today.  Has been well-controlled. HLP: Recent lipid parameters in target range. History of hypothyroidism: Not long ago her methimazole was discontinued by her endocrinologist, Dr. Kelton Pillar, after being euthyroid for a long time.  Clinically she appears euthyroid.  When she comes in for her labs we will check to make sure that she does not have recurrent hyperthyroidism as a cause for the persistent atrial fibrillation.  Shared Decision Making/Informed Consent The risks (stroke, cardiac arrhythmias rarely resulting in the need for a temporary or permanent pacemaker, skin irritation or burns and complications associated with conscious sedation including aspiration, arrhythmia, respiratory failure and death), benefits (restoration of normal sinus rhythm) and alternatives of a direct current cardioversion were explained in detail to Ms. Poteet and she agrees to proceed.       Time:   Today, I have spent 30 minutes with the patient with telehealth technology discussing the above problems.     Medication Adjustments/Labs and Tests Ordered: Current medicines are reviewed at length with the patient today.  Concerns regarding medicines are outlined above.   Tests Ordered: Orders Placed This Encounter  Procedures   Basic metabolic panel   CBC    Medication Changes: No orders of the defined types were placed in this encounter.   Follow Up:  In Person  3 months.  Will do a pacemaker download to see durability of restoration of normal rhythm roughly 1 month after the cardioversion.  Signed, Sanda Klein, MD  06/09/2022 10:51 AM    [place]

## 2022-06-09 NOTE — Telephone Encounter (Signed)
Patient scheduled for a video visit with Dr.Croitoru 12/20 at 10:00 am.

## 2022-06-10 ENCOUNTER — Telehealth: Payer: Self-pay | Admitting: Cardiovascular Disease

## 2022-06-10 ENCOUNTER — Encounter: Payer: Self-pay | Admitting: Cardiovascular Disease

## 2022-06-10 LAB — BASIC METABOLIC PANEL
BUN/Creatinine Ratio: 20 (ref 12–28)
BUN: 17 mg/dL (ref 8–27)
CO2: 28 mmol/L (ref 20–29)
Calcium: 9.6 mg/dL (ref 8.7–10.3)
Chloride: 102 mmol/L (ref 96–106)
Creatinine, Ser: 0.87 mg/dL (ref 0.57–1.00)
Glucose: 93 mg/dL (ref 70–99)
Potassium: 4.5 mmol/L (ref 3.5–5.2)
Sodium: 143 mmol/L (ref 134–144)
eGFR: 71 mL/min/{1.73_m2} (ref >59)

## 2022-06-10 LAB — CBC
Hematocrit: 43.5 % (ref 34.0–46.6)
Hemoglobin: 14.7 g/dL (ref 11.1–15.9)
MCH: 34 pg — ABNORMAL HIGH (ref 26.6–33.0)
MCHC: 33.8 g/dL (ref 31.5–35.7)
MCV: 101 fL — ABNORMAL HIGH (ref 79–97)
Platelets: 208 10*3/uL (ref 150–450)
RBC: 4.32 x10E6/uL (ref 3.77–5.28)
RDW: 11.9 % (ref 11.7–15.4)
WBC: 8.2 10*3/uL (ref 3.4–10.8)

## 2022-06-10 NOTE — Telephone Encounter (Signed)
LMTCB

## 2022-06-10 NOTE — Telephone Encounter (Signed)
I rescheduled the patient appointment for 07/01/2021.

## 2022-06-10 NOTE — Telephone Encounter (Signed)
Yes, I sent device clinic a note earlier today.

## 2022-06-10 NOTE — Telephone Encounter (Signed)
LMOVM for patient to call me back before 5 pm today.

## 2022-06-10 NOTE — Telephone Encounter (Signed)
Patient would like a call back to confirm her next home pacer check should be on 1/10, closer to her surgery not on 1/3.

## 2022-06-21 DIAGNOSIS — M25511 Pain in right shoulder: Secondary | ICD-10-CM | POA: Insufficient documentation

## 2022-06-29 DIAGNOSIS — M19011 Primary osteoarthritis, right shoulder: Secondary | ICD-10-CM | POA: Diagnosis not present

## 2022-06-29 DIAGNOSIS — Z6825 Body mass index (BMI) 25.0-25.9, adult: Secondary | ICD-10-CM | POA: Diagnosis not present

## 2022-07-01 ENCOUNTER — Ambulatory Visit (INDEPENDENT_AMBULATORY_CARE_PROVIDER_SITE_OTHER): Payer: Medicare HMO

## 2022-07-01 ENCOUNTER — Telehealth: Payer: Self-pay

## 2022-07-01 DIAGNOSIS — I442 Atrioventricular block, complete: Secondary | ICD-10-CM

## 2022-07-01 LAB — CUP PACEART REMOTE DEVICE CHECK
Battery Impedance: 5355 Ohm
Battery Remaining Longevity: 6 mo
Battery Voltage: 2.66 V
Brady Statistic AP VP Percent: 10 %
Brady Statistic AP VS Percent: 0 %
Brady Statistic AS VP Percent: 90 %
Brady Statistic AS VS Percent: 0 %
Date Time Interrogation Session: 20240111073537
Implantable Lead Connection Status: 753985
Implantable Lead Connection Status: 753985
Implantable Lead Implant Date: 20110208
Implantable Lead Implant Date: 20110208
Implantable Lead Location: 753859
Implantable Lead Location: 753860
Implantable Lead Model: 5076
Implantable Lead Model: 5076
Implantable Pulse Generator Implant Date: 20110208
Lead Channel Impedance Value: 411 Ohm
Lead Channel Impedance Value: 530 Ohm
Lead Channel Pacing Threshold Amplitude: 0.375 V
Lead Channel Pacing Threshold Amplitude: 1 V
Lead Channel Pacing Threshold Pulse Width: 0.4 ms
Lead Channel Pacing Threshold Pulse Width: 0.4 ms
Lead Channel Setting Pacing Amplitude: 1.5 V
Lead Channel Setting Pacing Amplitude: 2.25 V
Lead Channel Setting Pacing Pulse Width: 0.4 ms
Lead Channel Setting Sensing Sensitivity: 2 mV
Zone Setting Status: 755011
Zone Setting Status: 755011

## 2022-07-01 NOTE — Telephone Encounter (Signed)
Transmission received.  Presenting rhythm AS/VP 63.  Will forward to Dr. Loletha Grayer for review.

## 2022-07-01 NOTE — Telephone Encounter (Signed)
Scheduled remote reviewed. Normal device function.  Hx of PAF, burden 16.5%, Xarelto  Battery estimated 25mo- route to triage Next remote 3/18 [place]  Noted, patient informed and placed on monthly remote checks.  FYI to Dr. CSallyanne Kuster

## 2022-07-01 NOTE — Telephone Encounter (Signed)
Thank you. I called Mrs. [name]. Procedure cancelled. Will continue with remote downloads. She is on anticoagulation and asymptomatic, no problems with RVR due to AV block.

## 2022-07-02 ENCOUNTER — Ambulatory Visit (HOSPITAL_COMMUNITY): Admission: RE | Admit: 2022-07-02 | Payer: Medicare HMO | Source: Home / Self Care | Admitting: Cardiovascular Disease

## 2022-07-02 ENCOUNTER — Encounter (HOSPITAL_COMMUNITY): Admission: RE | Payer: Self-pay | Source: Home / Self Care

## 2022-07-02 SURGERY — CARDIOVERSION
Anesthesia: Monitor Anesthesia Care

## 2022-07-06 DIAGNOSIS — D2261 Melanocytic nevi of right upper limb, including shoulder: Secondary | ICD-10-CM | POA: Diagnosis not present

## 2022-07-06 DIAGNOSIS — D2271 Melanocytic nevi of right lower limb, including hip: Secondary | ICD-10-CM | POA: Diagnosis not present

## 2022-07-06 DIAGNOSIS — L821 Other seborrheic keratosis: Secondary | ICD-10-CM | POA: Diagnosis not present

## 2022-07-06 DIAGNOSIS — L814 Other melanin hyperpigmentation: Secondary | ICD-10-CM | POA: Diagnosis not present

## 2022-07-06 DIAGNOSIS — D2272 Melanocytic nevi of left lower limb, including hip: Secondary | ICD-10-CM | POA: Diagnosis not present

## 2022-07-06 DIAGNOSIS — D1801 Hemangioma of skin and subcutaneous tissue: Secondary | ICD-10-CM | POA: Diagnosis not present

## 2022-07-06 DIAGNOSIS — D2262 Melanocytic nevi of left upper limb, including shoulder: Secondary | ICD-10-CM | POA: Diagnosis not present

## 2022-07-06 DIAGNOSIS — D225 Melanocytic nevi of trunk: Secondary | ICD-10-CM | POA: Diagnosis not present

## 2022-07-07 ENCOUNTER — Other Ambulatory Visit: Payer: Self-pay

## 2022-07-07 ENCOUNTER — Encounter (HOSPITAL_COMMUNITY): Payer: Self-pay | Admitting: Emergency Medicine

## 2022-07-07 ENCOUNTER — Emergency Department (HOSPITAL_COMMUNITY)
Admission: EM | Admit: 2022-07-07 | Discharge: 2022-07-07 | Disposition: A | Discharge status: Home / Self Care | Payer: Medicare HMO | Attending: Emergency Medicine | Admitting: Emergency Medicine

## 2022-07-07 ENCOUNTER — Emergency Department (HOSPITAL_COMMUNITY): Payer: Medicare HMO

## 2022-07-07 DIAGNOSIS — X58XXXA Exposure to other specified factors, initial encounter: Secondary | ICD-10-CM | POA: Insufficient documentation

## 2022-07-07 DIAGNOSIS — Z950 Presence of cardiac pacemaker: Secondary | ICD-10-CM | POA: Diagnosis not present

## 2022-07-07 DIAGNOSIS — Z79899 Other long term (current) drug therapy: Secondary | ICD-10-CM | POA: Insufficient documentation

## 2022-07-07 DIAGNOSIS — I10 Essential (primary) hypertension: Secondary | ICD-10-CM | POA: Diagnosis not present

## 2022-07-07 DIAGNOSIS — M25511 Pain in right shoulder: Secondary | ICD-10-CM | POA: Diagnosis not present

## 2022-07-07 DIAGNOSIS — S46911A Strain of unspecified muscle, fascia and tendon at shoulder and upper arm level, right arm, initial encounter: Secondary | ICD-10-CM | POA: Diagnosis not present

## 2022-07-07 DIAGNOSIS — S4991XA Unspecified injury of right shoulder and upper arm, initial encounter: Secondary | ICD-10-CM | POA: Diagnosis present

## 2022-07-07 MED ORDER — ONDANSETRON 4 MG PO TBDP
8.0000 mg | ORAL_TABLET | Freq: Once | ORAL | Status: AC
  Administered 2022-07-07: 8 mg via ORAL
  Filled 2022-07-07: qty 2

## 2022-07-07 MED ORDER — OXYCODONE-ACETAMINOPHEN 5-325 MG PO TABS: 1.0000 | ORAL_TABLET | ORAL | 0 refills | Status: AC | PRN

## 2022-07-07 MED ORDER — OXYCODONE-ACETAMINOPHEN 5-325 MG PO TABS
1.0000 | ORAL_TABLET | Freq: Once | ORAL | Status: AC
  Administered 2022-07-07: 1 via ORAL
  Filled 2022-07-07: qty 1

## 2022-07-07 NOTE — ED Provider Notes (Signed)
[name] EMERGENCY DEPARTMENT Provider Note   CSN: [id number] Arrival date & time: 07/07/22  0013     History  Chief Complaint  Patient presents with   Shoulder Pain    [name] is a 72 y.o. female.  Pt reports she has had pain in her right shoulder since she had a pacemaker placed.  Pt reports severe pain in shoulder down her arm.  Pt saw her MD last week and had an injection that helped.    The history is provided by the patient. No language interpreter was used.  Shoulder Pain Location:  Shoulder Shoulder location:  R shoulder Injury: no   Pain details:    Quality:  Aching   Radiates to:  Back   Severity:  Moderate   Onset quality:  Gradual   Duration:  4 weeks   Timing:  Constant Prior injury to area:  No Worsened by:  Nothing Ineffective treatments:  None tried Risk factors: no concern for non-accidental trauma and no frequent fractures        Home Medications Prior to Admission medications   Medication Sig Start Date End Date Taking? Authorizing Provider  acetaminophen (TYLENOL) 650 MG CR tablet Take 650 mg by mouth every 8 (eight) hours as needed for pain.    [provider]  aspirin-acetaminophen-caffeine (HEADACHE RELIEF) 250-250-65 MG tablet Take 1 tablet by mouth every 8 (eight) hours as needed for headache.    [provider]  Calcium Carb-Cholecalciferol (CALCIUM-VITAMIN D) 600-400 MG-UNIT TABS Take 1 tablet by mouth 2 (two) times daily.    [provider]  celecoxib (CELEBREX) 200 MG capsule Take 200 mg by mouth 2 (two) times daily.    [provider]  cholecalciferol (VITAMIN D3) 25 MCG (1000 UNIT) tablet Take 1,000 Units by mouth daily.    [provider]  escitalopram (LEXAPRO) 10 MG tablet Take 5-10 mg by mouth daily as needed (anxiety). 04/25/20   [provider]  FISH OIL-KRILL OIL PO Take 1 capsule by mouth 2 (two) times daily.    [provider]  lisinopril  (ZESTRIL) 20 MG tablet Take 20 mg by mouth daily.    [provider]  lisinopril-hydrochlorothiazide (ZESTORETIC) 20-12.5 MG tablet Take 1 tablet by mouth daily.    [provider]  metoprolol succinate (TOPROL-XL) 50 MG 24 hr tablet TAKE 1 TABLET BY MOUTH ONCE DAILY WITH OR IMMEDIATELY FOLLOWING A MEAL 08/12/21   Croitoru, Mihai, MD  Multiple Vitamins-Minerals (WOMENS 50+ MULTI VITAMIN/MIN PO) Take 1 tablet by mouth daily.    [provider]  rivaroxaban (XARELTO) 20 MG TABS tablet TAKE 1 TABLET BY MOUTH ONCE DAILY WITH SUPPER 12/15/21   Croitoru, Mihai, MD  rosuvastatin (CRESTOR) 10 MG tablet Take 1 tablet by mouth once daily 08/05/21   Croitoru, Dani Gobble, MD      Allergies    Vicodin [hydrocodone-acetaminophen] and Atorvastatin    Review of Systems   Review of Systems  All other systems reviewed and are negative.   Physical Exam Updated Vital Signs BP (!) 180/95 (BP Location: Right Arm)   Pulse 78   Temp 98 F (36.7 C) (Oral)   Resp 18   Ht '5\' 1"'$  (1.549 m)   Wt 62.1 kg   SpO2 94%   BMI 25.87 kg/m  Physical Exam Vitals and nursing note reviewed.  Constitutional:      Appearance: She is well-developed.  HENT:     Head: Normocephalic.  Cardiovascular:  Rate and Rhythm: Normal rate.  Pulmonary:     Effort: Pulmonary effort is normal.  Abdominal:     General: There is no distension.  Musculoskeletal:        General: Tenderness present.     Cervical back: Normal range of motion.  Skin:    General: Skin is warm.  Neurological:     General: No focal deficit present.     Mental Status: She is alert and oriented to person, place, and time.  Psychiatric:        Mood and Affect: Mood normal.     ED Results / Procedures / Treatments   Labs (all labs ordered are listed, but only abnormal results are displayed) Labs Reviewed - No data to display  EKG None  Radiology DG Shoulder Right  Result Date: 07/07/2022 CLINICAL DATA:  Right shoulder  pain due to pacemaker EXAM: RIGHT SHOULDER - 3 VIEW COMPARISON:  No prior shoulder radiographs available, correlation is made with 10/30/2021 chest radiograph FINDINGS: There is no evidence of fracture or dislocation. 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 Cardiac device overlies the right upper chest. Soft tissues are otherwise unremarkable. IMPRESSION: No acute osseous abnormality. 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 Electronically Signed   By: Merilyn Baba M.D.   On: 07/07/2022 02:48    Procedures Procedures    Medications Ordered in ED Medications  oxyCODONE-acetaminophen (PERCOCET/ROXICET) 5-325 MG per tablet 1 tablet (1 tablet Oral Given 07/07/22 0217)  ondansetron (ZOFRAN-ODT) disintegrating tablet 8 mg (8 mg Oral Given 07/07/22 0216)    ED Course/ Medical Decision Making/ A&P                             Medical Decision Making Pt reports she has had shoulder pain since having a pacemaker placed   Amount and/or Complexity of Data Reviewed Independent Historian: spouse    Details: Pt here with her husband who is supportive  Radiology: ordered and independent interpretation performed. Decision-making details documented in ED Course.    Details: Xray shows degenerative changes at [facility] joint  Risk Prescription drug management. Decision regarding hospitalization. Risk Details: I suspect pain is due to degenerative changes AC joint.  I doubt related to pacemaker.  EKg no acute.  Pt gvien an rx for pain medication and placed in a sling.  Pt advised to schedule to see Orthopaedist for evaltuion            Final Clinical Impression(s) / ED Diagnoses Final diagnoses:  Strain of right shoulder, initial encounter    Rx / DC Orders ED Discharge Orders     None      An After Visit Summary was printed and given to the patient.    Fransico Meadow, [place] 07/07/22 1028    Tegeler, Gwenyth Allegra, MD 07/07/22 [phone]

## 2022-07-07 NOTE — ED Notes (Signed)
Patient Alert and oriented to baseline. Stable and ambulatory to baseline. Patient verbalized understanding of the discharge instructions.  Patient belongings were taken by the patient.   

## 2022-07-07 NOTE — ED Provider Triage Note (Signed)
Emergency Medicine Provider Triage Evaluation Note  [name] , a 72 y.o. female  was evaluated in triage.  Pt complains of right-sided shoulder pain, going on for years since her pacemaker was placed and, pain is made in her right shoulder, will occasionally feel pain rating down her right arm, no recent trauma, not endorsing any chest pain or shortness of breath.  Review of Systems  Positive: Shoulder pain, paresthesias Negative: Chest pain, shortness of breath  Physical Exam  BP (!) 192/109 (BP Location: Left Arm)   Pulse 83   Temp 98 F (36.7 C)   Resp 18   Ht '5\' 1"'$  (1.549 m)   Wt 62.1 kg   SpO2 99%   BMI 25.87 kg/m  Gen:   Awake, no distress   Resp:  Normal effort  MSK:   Moves extremities without difficulty  Other:    Medical Decision Making  Medically screening exam initiated at 2:11 AM.  Appropriate orders placed.  LANISE MERGEN was informed that the remainder of the evaluation will be completed by another provider, this initial triage assessment does not replace that evaluation, and the importance of remaining in the ED until their evaluation is complete.  Imaging has been obtained will need further workup.   Marcello Fennel, PA-C 07/07/22 [phone]

## 2022-07-07 NOTE — Progress Notes (Signed)
Orthopedic Tech Progress Note Patient Details:  [name] 1950/06/30 [id number]  Ortho Devices Type of Ortho Device: Shoulder immobilizer Ortho Device/Splint Location: RUE Ortho Device/Splint Interventions: Application   Post Interventions Patient Tolerated: Well  Linus Salmons Tonnie Friedel 07/07/2022, 10:27 AM

## 2022-07-07 NOTE — ED Triage Notes (Signed)
Per pt she was seen by provider for right shoulder pain (X years) however tonight she began to experiencing right neck pain and right arm pain.  OTC medications have not relieved the pain.  Pt is having tingling in fingers.

## 2022-07-09 ENCOUNTER — Encounter: Payer: Self-pay | Admitting: Cardiovascular Disease

## 2022-07-12 DIAGNOSIS — R7309 Other abnormal glucose: Secondary | ICD-10-CM | POA: Diagnosis not present

## 2022-07-12 DIAGNOSIS — Z0001 Encounter for general adult medical examination with abnormal findings: Secondary | ICD-10-CM | POA: Diagnosis not present

## 2022-07-12 DIAGNOSIS — E7849 Other hyperlipidemia: Secondary | ICD-10-CM | POA: Diagnosis not present

## 2022-07-12 DIAGNOSIS — M19011 Primary osteoarthritis, right shoulder: Secondary | ICD-10-CM | POA: Diagnosis not present

## 2022-07-12 DIAGNOSIS — I10 Essential (primary) hypertension: Secondary | ICD-10-CM | POA: Diagnosis not present

## 2022-07-12 DIAGNOSIS — E663 Overweight: Secondary | ICD-10-CM | POA: Diagnosis not present

## 2022-07-12 DIAGNOSIS — Z1331 Encounter for screening for depression: Secondary | ICD-10-CM | POA: Diagnosis not present

## 2022-07-12 DIAGNOSIS — E119 Type 2 diabetes mellitus without complications: Secondary | ICD-10-CM | POA: Diagnosis not present

## 2022-07-12 DIAGNOSIS — E782 Mixed hyperlipidemia: Secondary | ICD-10-CM | POA: Diagnosis not present

## 2022-07-12 DIAGNOSIS — Z6825 Body mass index (BMI) 25.0-25.9, adult: Secondary | ICD-10-CM | POA: Diagnosis not present

## 2022-07-12 DIAGNOSIS — I480 Paroxysmal atrial fibrillation: Secondary | ICD-10-CM | POA: Diagnosis not present

## 2022-07-12 DIAGNOSIS — Z950 Presence of cardiac pacemaker: Secondary | ICD-10-CM | POA: Diagnosis not present

## 2022-07-14 NOTE — Progress Notes (Signed)
Remote pacemaker transmission.   

## 2022-07-19 DIAGNOSIS — M25511 Pain in right shoulder: Secondary | ICD-10-CM | POA: Diagnosis not present

## 2022-07-21 NOTE — Progress Notes (Signed)
Remote pacemaker transmission.   

## 2022-07-27 DIAGNOSIS — M25511 Pain in right shoulder: Secondary | ICD-10-CM | POA: Diagnosis not present

## 2022-07-31 ENCOUNTER — Other Ambulatory Visit: Payer: Self-pay | Admitting: Cardiovascular Disease

## 2022-08-02 ENCOUNTER — Ambulatory Visit: Payer: Medicare HMO

## 2022-08-02 DIAGNOSIS — I442 Atrioventricular block, complete: Secondary | ICD-10-CM

## 2022-08-03 LAB — CUP PACEART REMOTE DEVICE CHECK
Battery Impedance: 6178 Ohm
Battery Remaining Longevity: 2 mo
Battery Voltage: 2.64 V
Brady Statistic AP VP Percent: 9 %
Brady Statistic AP VS Percent: 0 %
Brady Statistic AS VP Percent: 90 %
Brady Statistic AS VS Percent: 0 %
Date Time Interrogation Session: 20240212082319
Implantable Lead Connection Status: 753985
Implantable Lead Connection Status: 753985
Implantable Lead Implant Date: 20110208
Implantable Lead Implant Date: 20110208
Implantable Lead Location: 753859
Implantable Lead Location: 753860
Implantable Lead Model: 5076
Implantable Lead Model: 5076
Implantable Pulse Generator Implant Date: 20110208
Lead Channel Impedance Value: 436 Ohm
Lead Channel Impedance Value: 550 Ohm
Lead Channel Pacing Threshold Amplitude: 0.375 V
Lead Channel Pacing Threshold Amplitude: 0.75 V
Lead Channel Pacing Threshold Pulse Width: 0.4 ms
Lead Channel Pacing Threshold Pulse Width: 0.4 ms
Lead Channel Setting Pacing Amplitude: 1.5 V
Lead Channel Setting Pacing Amplitude: 2.25 V
Lead Channel Setting Pacing Pulse Width: 0.4 ms
Lead Channel Setting Sensing Sensitivity: 2 mV
Zone Setting Status: 755011
Zone Setting Status: 755011

## 2022-08-05 ENCOUNTER — Encounter: Payer: Self-pay | Admitting: Cardiovascular Disease

## 2022-08-09 DIAGNOSIS — M25511 Pain in right shoulder: Secondary | ICD-10-CM | POA: Diagnosis not present

## 2022-08-10 ENCOUNTER — Other Ambulatory Visit: Payer: Self-pay | Admitting: Cardiovascular Disease

## 2022-08-12 DIAGNOSIS — M25511 Pain in right shoulder: Secondary | ICD-10-CM | POA: Diagnosis not present

## 2022-08-17 DIAGNOSIS — M25511 Pain in right shoulder: Secondary | ICD-10-CM | POA: Diagnosis not present

## 2022-08-19 DIAGNOSIS — M25511 Pain in right shoulder: Secondary | ICD-10-CM | POA: Diagnosis not present

## 2022-08-23 DIAGNOSIS — M25511 Pain in right shoulder: Secondary | ICD-10-CM | POA: Diagnosis not present

## 2022-08-26 DIAGNOSIS — M25511 Pain in right shoulder: Secondary | ICD-10-CM | POA: Diagnosis not present

## 2022-08-30 DIAGNOSIS — M25511 Pain in right shoulder: Secondary | ICD-10-CM | POA: Diagnosis not present

## 2022-08-31 DIAGNOSIS — M25511 Pain in right shoulder: Secondary | ICD-10-CM | POA: Diagnosis not present

## 2022-09-02 ENCOUNTER — Telehealth: Payer: Self-pay

## 2022-09-02 ENCOUNTER — Encounter: Payer: Self-pay | Admitting: Cardiovascular Disease

## 2022-09-02 ENCOUNTER — Ambulatory Visit: Payer: Medicare HMO

## 2022-09-02 NOTE — Telephone Encounter (Signed)
Pt is nearing ERI for Adapta PPM.  Per Dr. Luan Moore TO SCHEDULE GEN CHANGE WITH NO PRIOR APPOINTMENT NEEDED.  Will continue to monitor remotely.

## 2022-09-15 NOTE — Progress Notes (Signed)
Remote pacemaker transmission.   

## 2022-09-15 NOTE — Addendum Note (Signed)
Addended by: Douglass Rivers D on: 09/15/2022 11:54 AM   Modules accepted: Level of Service

## 2022-09-30 ENCOUNTER — Encounter: Payer: Self-pay | Admitting: *Deleted

## 2022-10-04 ENCOUNTER — Ambulatory Visit (INDEPENDENT_AMBULATORY_CARE_PROVIDER_SITE_OTHER): Payer: Medicare HMO

## 2022-10-04 DIAGNOSIS — I442 Atrioventricular block, complete: Secondary | ICD-10-CM | POA: Diagnosis not present

## 2022-10-05 ENCOUNTER — Telehealth: Payer: Self-pay

## 2022-10-05 ENCOUNTER — Other Ambulatory Visit: Payer: Self-pay | Admitting: Emergency Medicine

## 2022-10-05 ENCOUNTER — Encounter: Payer: Self-pay | Admitting: Cardiovascular Disease

## 2022-10-05 DIAGNOSIS — Z950 Presence of cardiac pacemaker: Secondary | ICD-10-CM

## 2022-10-05 LAB — CUP PACEART REMOTE DEVICE CHECK
Battery Impedance: 8586 Ohm
Battery Voltage: 2.61 V
Brady Statistic RV Percent Paced: 99 %
Date Time Interrogation Session: 20240415094400
Implantable Lead Connection Status: 753985
Implantable Lead Connection Status: 753985
Implantable Lead Implant Date: 20110208
Implantable Lead Implant Date: 20110208
Implantable Lead Location: 753859
Implantable Lead Location: 753860
Implantable Lead Model: 5076
Implantable Lead Model: 5076
Implantable Pulse Generator Implant Date: 20110208
Lead Channel Impedance Value: 519 Ohm
Lead Channel Impedance Value: 67 Ohm
Lead Channel Setting Pacing Amplitude: 2.25 V
Lead Channel Setting Pacing Pulse Width: 0.4 ms
Lead Channel Setting Sensing Sensitivity: 2 mV
Zone Setting Status: 755011
Zone Setting Status: 755011

## 2022-10-05 NOTE — Telephone Encounter (Signed)
Patient's device reached ERI 09/25/22, called patient and let her know this could be a cause for her SOB as her device has reverted to VVI 65 patient voiced understanding

## 2022-10-05 NOTE — Telephone Encounter (Signed)
Called patient and went over the information below. Pt will [facility] [place] tomorrow 10/06/22. She will not take her Xarelto- her last dose was Monday April 15th in the evening. She verbalized understanding of all instructions. Will send this information via MyChart as well. Instructed to call and/or message with any questions/concerns.    Implantable Device Instructions    [name]  10/05/2022  You are scheduled for a PPM generator change on Thursday, April 18 with Dr. Rachelle Hora Croitoru.  1. Pre procedure Lab testing: You will go to any [facility] to have blood drawn 10/06/22 in the morning. Orders for CBC and BMP placed.  2. Please arrive at the Main Entrance A at [facility]: [address] [place], [place] [address] on April 18 at 1:30 PM (This time is two hours before your procedure to ensure your preparation). Free valet parking service is available. You will check in at ADMITTING. The support person will be asked to wait in the waiting room.  It is OK to have someone drop you off and come back when you are ready to be discharged.        Special note: Every effort is made to have your procedure done on time. Please understand that emergencies sometimes delay  scheduled procedures.  3.  No eating or drinking after midnight prior to procedure.     4.  Medication instructions:  On the morning of your procedure do not take Rivaroxaban (Xarelto). Your last dose of Rivaroxaban (Xarelto) will be on Monday October 04, 2022 in the evening.   5.  The night before your procedure and the morning of your procedure scrub your neck/chest with CHG surgical scrub.  See instruction letter.  6. Plan to go home the same day, you will only stay overnight if medically necessary. 7.  You MUST have a responsible adult to drive you home. 8.   An adult MUST be with you the first 24 hours after you arrive home. 9..  Bring a current list of your medications, and the last time and date  medication taken. 10. Bring ID and current insurance cards. 11. .Please wear clothes that are easy to get on and off and wear slip-on shoes.    You will follow up with the [facility] Device clinic 10-14 days after your procedure.  You will follow up with Dr. Rachelle Hora Croitoru 91 days after your procedure.  These appointments will be made for you.   * If you have ANY questions after you get home, please call the office at [phone] or send a MyChart message.  FYI: For your safety, and to allow [place] to monitor your vital signs accurately during the surgery/procedure we request that if you have artificial nails, gel coating, SNS etc. Please have those removed prior to your surgery/procedure. Not having the nail coverings /polish removed may result in cancellation or delay of your surgery/procedure.    [place] - Preparing For Surgery    Before surgery, you can play an important role. Because skin is not sterile, your skin needs to be as free of germs as possible. You can reduce the number of germs on your skin by washing with CHG (chlorahexidine gluconate) Soap before surgery.  CHG is an antiseptic cleaner which kills germs and bonds with the skin to continue killing germs even after washing.  Please do not use if you have an allergy to CHG or antibacterial soaps.  If your  skin becomes reddened/irritated stop using the CHG.   Do not shave (including legs and underarms) for at least 48 hours prior to first CHG shower.  It is OK to shave your face.  Please follow these instructions carefully:  1.  Shower the night before surgery and the morning of surgery with CHG.  2.  If you choose to wash your hair, wash your hair first as usual with your normal shampoo.  3.  After you shampoo, rinse your hair and body thoroughly to remove the shampoo.  4.  Use CHG as you would any other liquid soap.  You can apply CHG directly to the skin and wash gently with a clean washcloth. 5.  Apply the CHG Soap to your  body ONLY FROM THE NECK DOWN.  Do not use on open wounds or open sores.  Avoid contact with your eyes, ears, mouth and genitals (private parts).  Wash genitals (private parts) with your normal soap.  6.  Wash thoroughly, paying special attention to the area where your surgery will be performed.  7.  Thoroughly rinse your body with warm water from the neck down.   8.  DO NOT shower/wash with your normal soap after using and rinsing off the CHG soap.  9.  Pat yourself dry with a clean towel.           10.  Wear clean pajamas.           11.  Place clean sheets on your bed the night of your first shower and do not sleep with pets.  Day of Surgery: Do not apply any deodorants/lotions.  Please wear clean clothes to the hospital/surgery center.

## 2022-10-05 NOTE — Addendum Note (Signed)
Addended by: Scheryl Marten on: 10/05/2022 03:48 PM   Modules accepted: Orders

## 2022-10-05 NOTE — Telephone Encounter (Signed)
Device has reached elective replacement interval on 4/6//24. Device programming reverted to VVI 65  likely the cause of SOB. If possible would move up date with Dr. Royann Shivers to discuss generator change as patients symptoms will not likely resolve until device is changed out

## 2022-10-05 NOTE — Telephone Encounter (Signed)
Called pt- she states that her sob has increased over the last week or so with activity. Pt is not short of breath at rest/not sob while on the phone.   Denies- sweating, dizziness, chest/back/arm pain. She is worried about her device's battery life as well.  Appt made for device check- 10/21/22 at 0820- to discuss generator change out and sob.  Given ER precautions. Told to call with any questions/concerns/worsening symptoms. She verbalized understanding.

## 2022-10-05 NOTE — Progress Notes (Signed)
CBC, BMP changed to STAT and orders released

## 2022-10-06 DIAGNOSIS — Z01818 Encounter for other preprocedural examination: Secondary | ICD-10-CM | POA: Diagnosis not present

## 2022-10-06 DIAGNOSIS — Z950 Presence of cardiac pacemaker: Secondary | ICD-10-CM | POA: Diagnosis not present

## 2022-10-06 LAB — CBC
Hematocrit: 39.3 % (ref 34.0–46.6)
Hemoglobin: 12.9 g/dL (ref 11.1–15.9)
MCH: 32.8 pg (ref 26.6–33.0)
MCHC: 32.8 g/dL (ref 31.5–35.7)
MCV: 100 fL — ABNORMAL HIGH (ref 79–97)
Platelets: 225 10*3/uL (ref 150–450)
RBC: 3.93 x10E6/uL (ref 3.77–5.28)
RDW: 12.2 % (ref 11.7–15.4)
WBC: 6.6 10*3/uL (ref 3.4–10.8)

## 2022-10-07 ENCOUNTER — Ambulatory Visit (HOSPITAL_COMMUNITY)
Admission: RE | Admit: 2022-10-07 | Discharge: 2022-10-07 | Disposition: A | Discharge status: Home / Self Care | Payer: Medicare HMO | Attending: Cardiovascular Disease | Admitting: Cardiovascular Disease

## 2022-10-07 ENCOUNTER — Encounter (HOSPITAL_COMMUNITY)
Admission: RE | Disposition: A | Discharge status: Home / Self Care | Payer: Self-pay | Source: Home / Self Care | Attending: Cardiovascular Disease

## 2022-10-07 ENCOUNTER — Other Ambulatory Visit: Payer: Self-pay

## 2022-10-07 DIAGNOSIS — D6859 Other primary thrombophilia: Secondary | ICD-10-CM | POA: Insufficient documentation

## 2022-10-07 DIAGNOSIS — E7800 Pure hypercholesterolemia, unspecified: Secondary | ICD-10-CM | POA: Insufficient documentation

## 2022-10-07 DIAGNOSIS — Z79899 Other long term (current) drug therapy: Secondary | ICD-10-CM | POA: Diagnosis not present

## 2022-10-07 DIAGNOSIS — Z7901 Long term (current) use of anticoagulants: Secondary | ICD-10-CM | POA: Insufficient documentation

## 2022-10-07 DIAGNOSIS — I442 Atrioventricular block, complete: Secondary | ICD-10-CM | POA: Diagnosis not present

## 2022-10-07 DIAGNOSIS — I10 Essential (primary) hypertension: Secondary | ICD-10-CM | POA: Insufficient documentation

## 2022-10-07 DIAGNOSIS — I480 Paroxysmal atrial fibrillation: Secondary | ICD-10-CM | POA: Insufficient documentation

## 2022-10-07 DIAGNOSIS — Z45010 Encounter for checking and testing of cardiac pacemaker pulse generator [battery]: Secondary | ICD-10-CM | POA: Diagnosis not present

## 2022-10-07 DIAGNOSIS — Z87891 Personal history of nicotine dependence: Secondary | ICD-10-CM | POA: Insufficient documentation

## 2022-10-07 HISTORY — PX: PPM GENERATOR CHANGEOUT: EP1233

## 2022-10-07 LAB — BASIC METABOLIC PANEL
BUN/Creatinine Ratio: 21 (ref 12–28)
BUN: 20 mg/dL (ref 8–27)
CO2: 25 mmol/L (ref 20–29)
Calcium: 9.2 mg/dL (ref 8.7–10.3)
Chloride: 104 mmol/L (ref 96–106)
Creatinine, Ser: 0.94 mg/dL (ref 0.57–1.00)
Glucose: 134 mg/dL — ABNORMAL HIGH (ref 70–99)
Potassium: 4.3 mmol/L (ref 3.5–5.2)
Sodium: 143 mmol/L (ref 134–144)
eGFR: 64 mL/min/{1.73_m2} (ref >59)

## 2022-10-07 SURGERY — PPM GENERATOR CHANGEOUT

## 2022-10-07 MED ORDER — SODIUM CHLORIDE 0.9 % IV SOLN
INTRAVENOUS | Status: AC
  Administered 2022-10-07: 80 mg
  Filled 2022-10-07: qty 2

## 2022-10-07 MED ORDER — LIDOCAINE HCL (PF) 1 % IJ SOLN
INTRAMUSCULAR | Status: AC
  Filled 2022-10-07: qty 60

## 2022-10-07 MED ORDER — LIDOCAINE HCL (PF) 1 % IJ SOLN
INTRAMUSCULAR | Status: DC | PRN
  Administered 2022-10-07: 50 mL

## 2022-10-07 MED ORDER — RIVAROXABAN 20 MG PO TABS: 20.0000 mg | ORAL_TABLET | Freq: Every day | ORAL | 5 refills | Status: DC

## 2022-10-07 MED ORDER — CEFAZOLIN SODIUM-DEXTROSE 2-4 GM/100ML-% IV SOLN: 2.0000 g | INTRAVENOUS | Status: AC

## 2022-10-07 MED ORDER — ONDANSETRON HCL 4 MG/2ML IJ SOLN: 4.0000 mg | Freq: Four times a day (QID) | INTRAMUSCULAR | Status: DC | PRN

## 2022-10-07 MED ORDER — CEFAZOLIN SODIUM-DEXTROSE 2-4 GM/100ML-% IV SOLN
INTRAVENOUS | Status: AC
  Administered 2022-10-07: 2 g via INTRAVENOUS
  Filled 2022-10-07: qty 100

## 2022-10-07 MED ORDER — SODIUM CHLORIDE 0.9% FLUSH: 3.0000 mL | INTRAVENOUS | Status: DC | PRN

## 2022-10-07 MED ORDER — ACETAMINOPHEN 325 MG PO TABS: 325.0000 mg | ORAL_TABLET | ORAL | Status: DC | PRN

## 2022-10-07 MED ORDER — SODIUM CHLORIDE 0.9 % IV SOLN: INTRAVENOUS | Status: DC | PRN

## 2022-10-07 MED ORDER — SODIUM CHLORIDE 0.9 % IV SOLN: 80.0000 mg | INTRAVENOUS | Status: AC

## 2022-10-07 MED ORDER — SODIUM CHLORIDE 0.9 % IV SOLN: INTRAVENOUS | Status: DC

## 2022-10-07 MED ORDER — CHLORHEXIDINE GLUCONATE 4 % EX SOLN: 4.0000 | Freq: Once | CUTANEOUS | Status: DC

## 2022-10-07 MED ORDER — SODIUM CHLORIDE 0.9% FLUSH: 3.0000 mL | Freq: Two times a day (BID) | INTRAVENOUS | Status: DC

## 2022-10-07 SURGICAL SUPPLY — 8 items
CABLE SURGICAL S-101-97-12 (CABLE) ×1 IMPLANT
IPG PACE AZUR XT DR MRI W1DR01 (Pacemaker) IMPLANT
KIT WRENCH (KITS) IMPLANT
PACE AZURE XT DR MRI W1DR01 (Pacemaker) ×1 IMPLANT
PAD DEFIB RADIO PHYSIO CONN (PAD) ×1 IMPLANT
POUCH AIGIS-R ANTIBACT PPM (Mesh General) ×1 IMPLANT
POUCH AIGIS-R ANTIBACT PPM MED (Mesh General) IMPLANT
TRAY PACEMAKER INSERTION (PACKS) ×1 IMPLANT

## 2022-10-07 NOTE — Discharge Instructions (Signed)

## 2022-10-07 NOTE — Op Note (Signed)
Procedure report  Procedure performed:  Dual chamber pacemaker generator changeout ; Aigis pouch Reason for procedure:  1. Device generator at elective replacement interval  2. Complete heart block Procedure performed by:  Thurmon Fair, MD  Complications:  None  Estimated blood loss:  <5 mL  Medications administered during procedure:  Ancef 2 g intravenously,  lidocaine 1% 30 mL locally Device details:   New Generator Medtronic Azure model number [id number], serial number [id number] Right atrial lead (chronic) Medtronic , model number [id number], serial [id number] (implanted 07/29/2009) Right ventricular lead (chronic)  Medtronic, model number [id number], serial number [id number] (implanted 07/29/2009)  Explanted generator Medtronic Adapta,  model number [id number],  (implanted 07/29/2009)  Procedure details:  After the risks and benefits of the procedure were discussed the patient provided informed consent. She was brought to the cardiac catheter lab in the fasting state. The patient was prepped and draped in usual sterile fashion. Local anesthesia with 1% lidocaine was administered to to the left infraclavicular area. A 5-6cm horizontal incision was made parallel with and 2-3 cm caudal to the right clavicle, in the area of an old scar.  Using minimal electrocautery and mostly sharp and blunt dissection the prepectoral pocket was opened carefully to avoid injury to the loops of chronic leads. Extensive dissection was not necessary. The device was explanted. The pocket was carefully inspected for hemostasis and flushed with copious amounts of antibiotic solution.  The leads were disconnected from the old generator and testing of the lead parameters later showed excellent values. The new generator was connected to the chronic leads, with appropriate pacing noted.   The entire system was then carefully inserted in the pocket with care been taking that the leads and device assumed a comfortable  position without pressure on the incision. Great care was taken that the leads be located deep to the generator. The pocket was then closed in layers using 2 layers of 2-0 Vicryl, one layer of 3-0 Vicryl and cutaneous steristrips after which a sterile dressing was applied.   At the end of the procedure the following lead parameters were encountered:   Right atrial lead sensed P waves 1.5 mV, impedance 361 ohms, threshold 0.5 at 0.4 ms pulse width.  Right ventricular lead sensed R waves  none detected, impedance 437 ohms, threshold 1.0 at 0.45 ms pulse width.  Thurmon Fair, MD, [facility] [phone] office [phone] pager

## 2022-10-07 NOTE — H&P (Signed)
Cardiology Admission History and Physical   Patient ID: [name] MRN: [id number]; DOB: [birth year]   Admission date: 10/07/2022  PCP:  Assunta Found, MD   [facility] Providers Cardiologist:  Thurmon Fair, MD        Chief Complaint:  pacemaker battery depletion  Patient Profile:   [name] is a 72 y.o. female with complete heart block and infrequent paroxysmal atrial fibrillation, hypertension, hypercholesterolemia who is being seen 10/07/2022 for the evaluation of pacemaker battery at elective replacement indicator.  History of Present Illness:   [name] began feeling short of breath roughly 10 days ago when her pacemaker reached elective replacement indicator and switched to asynchronous VVI pacing.  Prior to that she was feeling well and had no cardiovascular complaints.  Her current device is a Medtronic Adapta dual-chamber pacemaker implanted in 2011 with Medtronic [id number] atrial and ventricular leads.  She is almost always in atrial sensed (sinus), ventricular paced mode, but is currently pacing VVI.  Lead parameters are all adequate.   Past Medical History:  Diagnosis Date   Arthritis    Atrial tachycardia    Atypical chest pain    Bundle branch block left    Dr. Alanda Amass is cardiologist   Elevated cholesterol    Hypertension    Osteopenia 01/2019   T score -1.8   Pacemaker    Paroxysmal atrial fibrillation (HCC)    rrare episodes on pacemaker interrogation   Wears glasses     Past Surgical History:  Procedure Laterality Date   CARDIAC CATHETERIZATION  2005   Ejection fraction is estimated at  greater than or equal to 60 %  Wall motion is normal.   CAROTID DUPLEX BILATERAL  march 08,2005   Impression : No evidence of of signficant plaque formation or stenosis.   CARPAL TUNNEL RELEASE  04/30/2011   Procedure: CARPAL TUNNEL RELEASE;  Surgeon: Wyn Forster., MD;  Location: [facility];  Service: Orthopedics;  Laterality:  Left;  left carpal tunnel release, inject left thumb   CARPAL TUNNEL RELEASE  04/28/2012   Procedure: CARPAL TUNNEL RELEASE;  Surgeon: Wyn Forster., MD;  Location:  SURGERY CENTER;  Service: Orthopedics;  Laterality: Right;   CATARACT EXTRACTION W/PHACO Right 03/01/2022   Procedure: CATARACT EXTRACTION PHACO AND INTRAOCULAR LENS PLACEMENT (IOC);  Surgeon: Fabio Pierce, MD;  Location: [facility];  Service: Ophthalmology;  Laterality: Right;  CDE: 3.75   CATARACT EXTRACTION W/PHACO Left 03/26/2022   Procedure: CATARACT EXTRACTION PHACO AND INTRAOCULAR LENS PLACEMENT (IOC);  Surgeon: Fabio Pierce, MD;  Location: [facility];  Service: Ophthalmology;  Laterality: Left;  CDE 8.45   COLONOSCOPY     COLONOSCOPY  04/14/2012   Procedure: COLONOSCOPY;  Surgeon: Corbin Ade, MD;  [facility];  Service: Endoscopy;  Laterality: N/A;  8:30 AM   DORSAL COMPARTMENT RELEASE  04/30/2011   Procedure: RELEASE DORSAL COMPARTMENT (DEQUERVAIN);  Surgeon: Wyn Forster., MD;  Location: [facility];  Service: Orthopedics;  Laterality: Left;  release 1st dorsal compartment on left   INSERT / REPLACE / REMOVE PACEMAKER  07/29/2009   PPM-Dual Chamber Systems Fluoro Guidance Venogram performed 10 ml 's to the left subclavian vein   PACEMAKER INSERTION  07/2009   Dr. Alanda Amass  Pacemaker is a Adapta L implanted 07/2009   RENAL CIRCULATION  07/ 08/ 08   normal renal duplex doppler    TRANSTHORACIC ECHOCARDIOGRAM  10/18/2012   Atrial  tracking & V paced rhythm with pacer induced LBBB. No significant intra -ventricular  dyssynchrony.   TUBAL LIGATION  1986     Medications Prior to Admission: Prior to Admission medications   Medication Sig Start Date End Date Taking? Authorizing Provider  acetaminophen (TYLENOL) 650 MG CR tablet Take 650 mg by mouth every 8 (eight) hours as needed for pain.   Yes [provider]  aspirin-acetaminophen-caffeine (HEADACHE RELIEF) 250-250-65 MG  tablet Take 1 tablet by mouth every 8 (eight) hours as needed for headache.   Yes [provider]  Calcium Carb-Cholecalciferol (CALCIUM-VITAMIN D) 600-400 MG-UNIT TABS Take 1 tablet by mouth 2 (two) times daily.   Yes [provider]  celecoxib (CELEBREX) 200 MG capsule Take 200 mg by mouth 2 (two) times daily as needed for moderate pain.   Yes [provider]  cholecalciferol (VITAMIN D3) 25 MCG (1000 UNIT) tablet Take 1,000 Units by mouth daily.   Yes [provider]  diclofenac Sodium (VOLTAREN) 1 % GEL Apply 1 Application topically 3 (three) times daily as needed (joint pain).   Yes [provider]  escitalopram (LEXAPRO) 10 MG tablet Take 5-10 mg by mouth daily as needed (anxiety). 04/25/20  Yes [provider]  lisinopril (ZESTRIL) 20 MG tablet Take 20 mg by mouth daily.   Yes [provider]  lisinopril-hydrochlorothiazide (ZESTORETIC) 20-12.5 MG tablet Take 1 tablet by mouth daily.   Yes [provider]  Menthol, Topical Analgesic, (BIOFREEZE) 4 % GEL Apply 1 application  topically as needed.   Yes [provider]  metoprolol succinate (TOPROL-XL) 50 MG 24 hr tablet Take 1 tablet (50 mg total) by mouth daily. 08/10/22  Yes Andreia Gandolfi, MD  Multiple Vitamins-Minerals (WOMENS 50+ MULTI VITAMIN/MIN PO) Take 1 tablet by mouth daily.   Yes [provider]  Omega-3 Fatty Acids (FISH OIL) 1000 MG CAPS Take 1,000 mg by mouth in the morning and at bedtime. Taken at [employer] and in the Afternoon (3p)   Yes [provider]  phenylephrine (EQ NASAL FOUR) 1 % nasal spray Place 1 drop into both nostrils every 6 (six) hours as needed for congestion.   Yes [provider]  Pseudoeph-CPM-DM-APAP (EQ FLU/SEVERE COLD/CONGESTION PO) Take 1-2 capsules by mouth 2 (two) times daily as needed (cold/flu symptoms).   Yes [provider]  rivaroxaban (XARELTO) 20 MG TABS tablet TAKE 1 TABLET BY MOUTH ONCE  DAILY WITH SUPPER 12/15/21  Yes Cody Oliger, MD  rosuvastatin (CRESTOR) 10 MG tablet Take 1 tablet (10 mg total) by mouth daily. Patient taking differently: Take 10 mg by mouth at bedtime. 08/02/22  Yes Miqueas Whilden, MD  oxyCODONE-acetaminophen (PERCOCET) 5-325 MG tablet Take 1 tablet by mouth every 4 (four) hours as needed for severe pain. Patient not taking: Reported on 10/05/2022 07/07/22 07/07/23  Elson Areas, PA-C     Allergies:    Allergies  Allergen Reactions   Vicodin [Hydrocodone-Acetaminophen] Nausea Only   Lipitor [Atorvastatin] Other (See Comments)    Muscle/joint pain    Social History:   Social History   Socioeconomic History   Marital status: Married    Spouse name: Not on file   Number of children: Not on file   Years of education: Not on file   Highest education level: Not on file  Occupational History   Not on file  Tobacco Use   Smoking status: Former    Packs/day: 1.50    Years: 30.00    Additional pack  years: 0.00    Total pack years: 45.00    Types: Cigarettes   Smokeless tobacco: Never  Vaping Use   Vaping Use: Never used  Substance and Sexual Activity   Alcohol use: Yes    Alcohol/week: 7.0 standard drinks of alcohol    Types: 7 Glasses of wine per week   Drug use: No   Sexual activity: Not Currently    Birth control/protection: Post-menopausal, Surgical    Comment: Tubal lig-1st intercourse 72 yo-Fewer than 5 partners  Other Topics Concern   Not on file  Social History Narrative   Not on file   Social Determinants of Health   Financial Resource Strain: Not on file  Food Insecurity: Not on file  Transportation Needs: Not on file  Physical Activity: Not on file  Stress: Not on file  Social Connections: Not on file  Intimate Partner Violence: Not on file    Family History:   The patient's family history includes Colon cancer in her maternal aunt; Diabetes in her father; Heart disease in her mother and paternal grandfather. There  is no history of Thyroid disease.    ROS:  Please see the history of present illness.  All other ROS reviewed and negative.     Physical Exam/Data:  There were no vitals filed for this visit. No intake or output data in the 24 hours ending 10/07/22 1347    07/07/2022    2:08 AM 06/09/2022   10:13 AM 06/01/2022    9:46 AM  Last 3 Weights  Weight (lbs) 136 lb 14.5 oz 137 lb 136 lb 6.4 oz  Weight (kg) 62.1 kg 62.143 kg 61.871 kg     There is no height or weight on file to calculate BMI.  General:  Well nourished, well developed, in no acute distress, healthy subclavian pacemaker site HEENT: normal Neck: no JVD Vascular: No carotid bruits; Distal pulses 2+ bilaterally   Cardiac:  normal S1, paradoxically split S2; RRR; no murmur  Lungs:  clear to auscultation bilaterally, no wheezing, rhonchi or rales  Abd: soft, nontender, no hepatomegaly  Ext: no edema Musculoskeletal:  No deformities, BUE and BLE strength normal and equal Skin: warm and dry  Neuro:  CNs 2-12 intact, no focal abnormalities noted Psych:  Normal affect    EKG:  The ECG that was done 07/07/2022 shows mostly atrial sensed, ventricular paced rhythm with periods of AV sequential pacing.    Relevant CV Studies:   Laboratory Data:  High Sensitivity Troponin:  No results for input(s): "TROPONINIHS" in the last 720 hours.    Chemistry Recent Labs  Lab 10/06/22 0750  NA 143  K 4.3  CL 104  CO2 25  GLUCOSE 134*  BUN 20  CREATININE 0.94  CALCIUM 9.2    No results for input(s): "PROT", "ALBUMIN", "AST", "ALT", "ALKPHOS", "BILITOT" in the last 168 hours. Lipids No results for input(s): "CHOL", "TRIG", "HDL", "LABVLDL", "LDLCALC", "CHOLHDL" in the last 168 hours. Hematology Recent Labs  Lab 10/06/22 0747  WBC 6.6  RBC 3.93  HGB 12.9  HCT 39.3  MCV 100*  MCH 32.8  MCHC 32.8  RDW 12.2  PLT 225   Thyroid No results for input(s): "TSH", "FREET4" in the last 168 hours. BNPNo results for input(s): "BNP",  "PROBNP" in the last 168 hours.  DDimer No results for input(s): "DDIMER" in the last 168 hours.   Radiology/Studies:  No results found.   Assessment and Plan:   Pacemaker battery depletion: She feels poorly  and would like to have the generator change out performed as soon as possible. This procedure has been fully reviewed with the patient and written informed consent has been obtained. CHB: Pacemaker dependent.  Will use an Aigis pouch at the time of her pacemaker change Paroxysmal and persistent atrial fibrillation: Not seen recently.  On chronic Xarelto anticoagulation.  CHA2DS2-VASc 3 (age, gender, HTN). Acquired thrombophilia: No history of stroke or TIA.  No bleeding complications.  Xarelto cost has been a problem. HLP: On rosuvastatin. History of hyperthyroidism: Currently quiescent, no longer on antithyroid meds.  TSH was normal in December.   Risk Assessment/Risk Scores:         CHA2DS2-VASc Score = 3   This indicates a 3.2% annual risk of stroke. The patient's score is based upon: CHF History: 0 HTN History: 1 Diabetes History: 0 Stroke History: 0 Vascular Disease History: 0 Age Score: 1 Gender Score: 1      For questions or updates, please contact [facility] Please consult [email or web address] for contact info under     Signed, Thurmon Fair, MD  10/07/2022 1:47 PM

## 2022-10-08 ENCOUNTER — Encounter (HOSPITAL_COMMUNITY): Payer: Self-pay | Admitting: Cardiovascular Disease

## 2022-10-12 ENCOUNTER — Telehealth: Payer: Self-pay | Admitting: Internal Medicine

## 2022-10-12 ENCOUNTER — Other Ambulatory Visit: Payer: Self-pay | Admitting: Cardiovascular Disease

## 2022-10-12 NOTE — Telephone Encounter (Signed)
Patient received a letter to have an office visit due to meds and she called to schedule.  She asked if this could be a virtual visit and I told her that I would have to check and we would call her back to let her know.  Please advise

## 2022-10-13 NOTE — Telephone Encounter (Signed)
Pt last saw Dr Royann Shivers 06/09/22, last labs 10/06/22 Creat 0.94, age 72, weight 61.2kg, CrCl 52.27, based on CrCl pt is on appropriate dosage of Xarelto  QD for afib.  Will refill rx.

## 2022-10-21 ENCOUNTER — Ambulatory Visit: Payer: Medicare HMO | Attending: Cardiovascular Disease

## 2022-10-21 ENCOUNTER — Encounter: Payer: Medicare HMO | Admitting: Cardiovascular Disease

## 2022-10-21 DIAGNOSIS — I442 Atrioventricular block, complete: Secondary | ICD-10-CM

## 2022-10-21 DIAGNOSIS — H524 Presbyopia: Secondary | ICD-10-CM | POA: Diagnosis not present

## 2022-10-21 LAB — CUP PACEART INCLINIC DEVICE CHECK
Date Time Interrogation Session: 20240502162124
Implantable Lead Connection Status: 753985
Implantable Lead Connection Status: 753985
Implantable Lead Implant Date: 20110208
Implantable Lead Implant Date: 20110208
Implantable Lead Location: 753859
Implantable Lead Location: 753860
Implantable Lead Model: 5076
Implantable Lead Model: 5076
Implantable Pulse Generator Implant Date: 20240418

## 2022-10-21 NOTE — Progress Notes (Signed)
Error in saving report. See scanned media.   Wound check appointment. Steri-strips removed. Wound without redness or edema. Incision edges approximated, wound well healed. Normal device function. Thresholds, sensing, and impedances consistent with implant measurements. Device programmed at chronic outputs ~ chronic leads. Histogram distribution appropriate for patient and level of activity. AT/AF burden 15.9%. No high ventricular rates noted. Patient educated about wound care, arm mobility, lifting restrictions. ROV in 3 months with implanting physician.

## 2022-10-21 NOTE — Patient Instructions (Signed)

## 2022-10-26 ENCOUNTER — Ambulatory Visit: Payer: Medicare HMO | Admitting: Internal Medicine

## 2022-10-26 ENCOUNTER — Encounter: Payer: Self-pay | Admitting: Internal Medicine

## 2022-10-26 VITALS — BP 124/72 | HR 88 | Ht 61.0 in | Wt 137.0 lb

## 2022-10-26 DIAGNOSIS — Z8639 Personal history of other endocrine, nutritional and metabolic disease: Secondary | ICD-10-CM | POA: Insufficient documentation

## 2022-10-26 DIAGNOSIS — E0590 Thyrotoxicosis, unspecified without thyrotoxic crisis or storm: Secondary | ICD-10-CM

## 2022-10-26 LAB — T4, FREE: Free T4: 1.03 ng/dL (ref 0.60–1.60)

## 2022-10-26 LAB — TSH: TSH: 2.67 u[IU]/mL (ref 0.35–5.50)

## 2022-10-26 LAB — T3, FREE: T3, Free: 3.1 pg/mL (ref 2.3–4.2)

## 2022-10-26 NOTE — Progress Notes (Signed)
Name: [name]  MRN/ DOB: [id number], Mar 09, 1951    Age/ Sex: 72 y.o., female     PCP: Assunta Found, MD   Reason for Endocrinology Evaluation: Hyperthyroidism     Initial Endocrinology Clinic Visit: 04/14/2016    PATIENT IDENTIFIER: [name] is a 72 y.o., female with a past medical history of HTN, PAF and hyperhtyoidism. She has followed with [facility] since 04/14/2016 for consultative assistance with management of her Hyperthyroidism.   HISTORICAL SUMMARY: The patient was first diagnosed with Hyperthyroidism in 2017.   She had declined RAI through her previous endocrinoloogist   She has been on Methimazole for years until April, 2023 when she stopped it.    Repeat TFT's in 11/2021 showed normalization and we opted to remain off methimazole    No Amiodarone intake     No FH of thyroid disease  SUBJECTIVE:    Today (10/26/2022):  [name] is here for hyperthyroidism.   She is s/p PPM generator change output 10/07/2022  Weight has been stable  Occasional  palpitations Continues with intermittent loose stools No change in anterior neck  Minimal tremors  Has dry eyes with tearing , attributes to allergies      HISTORY:  Past Medical History:  Past Medical History:  Diagnosis Date   Arthritis    Atrial tachycardia    Atypical chest pain    Bundle branch block left    Dr. Alanda Amass is cardiologist   Elevated cholesterol    Hypertension    Osteopenia 01/2019   T score -1.8   Pacemaker    Paroxysmal atrial fibrillation (HCC)    rrare episodes on pacemaker interrogation   Wears glasses    Past Surgical History:  Past Surgical History:  Procedure Laterality Date   CARDIAC CATHETERIZATION  2005   Ejection fraction is estimated at  greater than or equal to 60 %  Wall motion is normal.   CAROTID DUPLEX BILATERAL  march 08,2005   Impression : No evidence of of signficant plaque formation or stenosis.   CARPAL TUNNEL RELEASE   04/30/2011   Procedure: CARPAL TUNNEL RELEASE;  Surgeon: Wyn Forster., MD;  Location: [facility];  Service: Orthopedics;  Laterality: Left;  left carpal tunnel release, inject left thumb   CARPAL TUNNEL RELEASE  04/28/2012   Procedure: CARPAL TUNNEL RELEASE;  Surgeon: Wyn Forster., MD;  Location: [facility];  Service: Orthopedics;  Laterality: Right;   CATARACT EXTRACTION W/PHACO Right 03/01/2022   Procedure: CATARACT EXTRACTION PHACO AND INTRAOCULAR LENS PLACEMENT (IOC);  Surgeon: Fabio Pierce, MD;  Location: [facility];  Service: Ophthalmology;  Laterality: Right;  CDE: 3.75   CATARACT EXTRACTION W/PHACO Left 03/26/2022   Procedure: CATARACT EXTRACTION PHACO AND INTRAOCULAR LENS PLACEMENT (IOC);  Surgeon: Fabio Pierce, MD;  Location: [facility];  Service: Ophthalmology;  Laterality: Left;  CDE 8.45   COLONOSCOPY     COLONOSCOPY  04/14/2012   Procedure: COLONOSCOPY;  Surgeon: Corbin Ade, MD;  [facility];  Service: Endoscopy;  Laterality: N/A;  8:30 AM   DORSAL COMPARTMENT RELEASE  04/30/2011   Procedure: RELEASE DORSAL COMPARTMENT (DEQUERVAIN);  Surgeon: Wyn Forster., MD;  Location: [facility]  Service: Orthopedics;  Laterality: Left;  release 1st dorsal compartment on left   INSERT / REPLACE / REMOVE PACEMAKER  07/29/2009   PPM-Dual Chamber Systems Fluoro Guidance Venogram performed 10 ml 's to the left subclavian  vein   PACEMAKER INSERTION  07/2009   Dr. Alanda Amass  Pacemaker is a Adapta L implanted 07/2009   PPM GENERATOR CHANGEOUT N/A 10/07/2022   Procedure: PPM GENERATOR CHANGEOUT - DUAL CHAMBER;  Surgeon: Thurmon Fair, MD;  [facility];  Service: Cardiovascular;  Laterality: N/A;   RENAL CIRCULATION  07/ 08/ 08   normal renal duplex doppler    TRANSTHORACIC ECHOCARDIOGRAM  10/18/2012   Atrial tracking & V paced rhythm with pacer induced LBBB. No significant intra -ventricular  dyssynchrony.    TUBAL LIGATION  1986   Social History:  reports that she has quit smoking. Her smoking use included cigarettes. She has a 45.00 pack-year smoking history. She has never used smokeless tobacco. She reports current alcohol use of about 7.0 standard drinks of alcohol per week. She reports that she does not use drugs. Family History:  Family History  Problem Relation Age of Onset   Heart disease Mother    Diabetes Father    Heart disease Paternal Grandfather    Colon cancer Maternal Aunt    Thyroid disease Neg Hx      HOME MEDICATIONS: Allergies as of 10/26/2022       Reactions   Vicodin [hydrocodone-acetaminophen] Nausea Only   Lipitor [atorvastatin] Other (See Comments)   Muscle/joint pain        Medication List        Accurate as of Oct 26, 2022 11:56 AM. If you have any questions, ask your nurse or doctor.          STOP taking these medications    EQ FLU/SEVERE COLD/CONGESTION PO Stopped by: Scarlette Shorts, MD       TAKE these medications    acetaminophen 650 MG CR tablet Commonly known as: TYLENOL Take 650 mg by mouth every 8 (eight) hours as needed for pain.   Biofreeze 4 % Gel Generic drug: Menthol (Topical Analgesic) Apply 1 application  topically as needed.   Calcium-Vitamin D 600-400 MG-UNIT Tabs Take 1 tablet by mouth 2 (two) times daily.   celecoxib 200 MG capsule Commonly known as: CELEBREX Take 200 mg by mouth 2 (two) times daily as needed for moderate pain.   cholecalciferol 25 MCG (1000 UNIT) tablet Commonly known as: VITAMIN D3 Take 1,000 Units by mouth daily.   diclofenac Sodium 1 % Gel Commonly known as: VOLTAREN Apply 1 Application topically 3 (three) times daily as needed (joint pain).   EQ NASAL FOUR 1 % nasal spray Generic drug: phenylephrine Place 1 drop into both nostrils every 6 (six) hours as needed for congestion.   escitalopram 10 MG tablet Commonly known as: LEXAPRO Take 5-10 mg by mouth daily as needed  (anxiety).   Fish Oil 1000 MG Caps Take 1,000 mg by mouth in the morning and at bedtime. Taken at [employer] and in the Afternoon (3p)   Headache Relief 250-250-65 MG tablet Generic drug: aspirin-acetaminophen-caffeine Take 1 tablet by mouth every 8 (eight) hours as needed for headache.   lisinopril 20 MG tablet Commonly known as: ZESTRIL Take 20 mg by mouth daily.   lisinopril-hydrochlorothiazide 20-12.5 MG tablet Commonly known as: ZESTORETIC Take 1 tablet by mouth daily.   metoprolol succinate 50 MG 24 hr tablet Commonly known as: TOPROL-XL Take 1 tablet (50 mg total) by mouth daily.   oxyCODONE-acetaminophen 5-325 MG tablet Commonly known as: Percocet Take 1 tablet by mouth every 4 (four) hours as needed for severe pain.   rosuvastatin 10 MG tablet Commonly  known as: CRESTOR Take 1 tablet (10 mg total) by mouth daily. What changed: when to take this   WOMENS 50+ MULTI VITAMIN/MIN PO Take 1 tablet by mouth daily.   Xarelto 20 MG Tabs tablet Generic drug: rivaroxaban TAKE 1 TABLET BY MOUTH ONCE DAILY WITH SUPPER          OBJECTIVE:   PHYSICAL EXAM: VS: BP 124/72 (BP Location: Left Arm, Patient Position: Sitting, Cuff Size: Large)   Pulse 88   Ht 5\' 1"  (1.549 m)   Wt 137 lb (62.1 kg)   SpO2 96%   BMI 25.89 kg/m    EXAM: General: Pt appears well and is in NAD  Neck: General: Supple without adenopathy. Thyroid: Thyroid size normal.  No goiter or nodules appreciated.   Lungs: Clear with good BS bilat with no rales, rhonchi, or wheezes  Heart: Auscultation: RRR.  Abdomen:  soft, nontender  Extremities:  BL LE: No pretibial edema normal ROM and strength.  Mental Status: Judgment, insight: Intact Orientation: Oriented to time, place, and person Mood and affect: No depression, anxiety, or agitation     DATA REVIEWED:  Latest Reference Range & Units 10/26/22 10:15  TSH 0.35 - 5.50 uIU/mL 2.67  Triiodothyronine,Free,Serum 2.3 - 4.2 pg/mL 3.1   T4,Free(Direct) 0.60 - 1.60 ng/dL 1.61     ASSESSMENT / PLAN / RECOMMENDATIONS:   Hyperthyroidism   - Clinical scenario consistent with Graves' disease  - She has been off Methimazole since ~ 2023 - Repeat TFT's are normal -We reviewed symptoms of unexplained weight loss, palpitations, diarrhea, tremors and to contact us sooner to schedule an appointment   F/U  in 1 yr    Signed electronically by: Lyndle Herrlich, MD  [facility]  [facility] [address] [place] [place], [place] [address] Phone: [phone] FAX: [phone]      CC: Assunta Found, MD [address] [place] [address] Phone: [phone]  Fax: [phone]   Return to Endocrinology clinic as below: Future Appointments  Date Time Provider Department Center  11/30/2022  2:00 PM Corbin Ade, MD [facility]  01/04/2023  9:30 AM Genia Del, MD [facility] None  01/11/2023  7:05 AM [facility] DEVICE REMOTES [facility]  01/13/2023 10:20 AM Croitoru, Rachelle Hora, MD [facility] None  04/12/2023  7:05 AM [facility] DEVICE REMOTES [facility]  07/12/2023  7:05 AM [facility] DEVICE REMOTES [facility]  10/11/2023  7:05 AM [facility] DEVICE REMOTES [facility]  10/28/2023  9:50 AM Kenyona Rena, Konrad Dolores, MD [facility] None  01/10/2024  7:05 AM [facility] DEVICE REMOTES [facility]  04/10/2024  7:05 AM [facility] DEVICE REMOTES [facility]  07/10/2024  7:05 AM [facility] DEVICE REMOTES [facility]  10/09/2024  7:05 AM [facility] DEVICE REMOTES [facility]

## 2022-10-29 ENCOUNTER — Encounter: Payer: Self-pay | Admitting: Cardiovascular Disease

## 2022-11-04 ENCOUNTER — Ambulatory Visit: Payer: Medicare HMO

## 2022-11-08 NOTE — Progress Notes (Signed)
Remote pacemaker transmission.   

## 2022-11-18 DIAGNOSIS — M25511 Pain in right shoulder: Secondary | ICD-10-CM | POA: Diagnosis not present

## 2022-11-30 ENCOUNTER — Ambulatory Visit: Payer: Medicare HMO | Admitting: Internal Medicine

## 2022-11-30 ENCOUNTER — Encounter: Payer: Self-pay | Admitting: Internal Medicine

## 2022-11-30 VITALS — BP 135/83 | HR 65 | Temp 98.0°F | Ht 61.0 in | Wt 137.2 lb

## 2022-11-30 DIAGNOSIS — Z1211 Encounter for screening for malignant neoplasm of colon: Secondary | ICD-10-CM

## 2022-11-30 DIAGNOSIS — Z79899 Other long term (current) drug therapy: Secondary | ICD-10-CM | POA: Diagnosis not present

## 2022-11-30 NOTE — Patient Instructions (Signed)
It was good to see you again today!  As discussed we will schedule a screening colonoscopy sometime in the next several weeks (ASA 3).  Will hold the Xarelto 2 days prior to the procedure.  Further recommendations to follow.

## 2022-11-30 NOTE — Progress Notes (Unsigned)
Primary Care Physician:  Assunta Found, MD Primary Gastroenterologist:  Dr. Jena Gauss  Pre-Procedure History & Physical: HPI:  [name] is a 72 y.o. female here for here to set up a screening colonoscopy.  Chronically anticoagulated for atrial fibrillation.  Has a pacemaker.  No bowel symptoms.  Had a normal colonoscopy 10 years ago.  No family history colon cancer needed first or second-degree relatives.  Past Medical History:  Diagnosis Date   Arthritis    Atrial tachycardia    Atypical chest pain    Bundle branch block left    Dr. Alanda Amass is cardiologist   Elevated cholesterol    Hypertension    Osteopenia 01/2019   T score -1.8   Pacemaker    Paroxysmal atrial fibrillation (HCC)    rrare episodes on pacemaker interrogation   Wears glasses     Past Surgical History:  Procedure Laterality Date   CARDIAC CATHETERIZATION  2005   Ejection fraction is estimated at  greater than or equal to 60 %  Wall motion is normal.   CAROTID DUPLEX BILATERAL  march 08,2005   Impression : No evidence of of signficant plaque formation or stenosis.   CARPAL TUNNEL RELEASE  04/30/2011   Procedure: CARPAL TUNNEL RELEASE;  Surgeon: Wyn Forster., MD;  Location: [facility];  Service: Orthopedics;  Laterality: Left;  left carpal tunnel release, inject left thumb   CARPAL TUNNEL RELEASE  04/28/2012   Procedure: CARPAL TUNNEL RELEASE;  Surgeon: Wyn Forster., MD;  Location: [facility];  Service: Orthopedics;  Laterality: Right;   CATARACT EXTRACTION W/PHACO Right 03/01/2022   Procedure: CATARACT EXTRACTION PHACO AND INTRAOCULAR LENS PLACEMENT (IOC);  Surgeon: Fabio Pierce, MD;  Location: [facility];  Service: Ophthalmology;  Laterality: Right;  CDE: 3.75   CATARACT EXTRACTION W/PHACO Left 03/26/2022   Procedure: CATARACT EXTRACTION PHACO AND INTRAOCULAR LENS PLACEMENT (IOC);  Surgeon: Fabio Pierce, MD;  Location: [facility];  Service: Ophthalmology;  Laterality:  Left;  CDE 8.45   COLONOSCOPY     COLONOSCOPY  04/14/2012   Procedure: COLONOSCOPY;  Surgeon: Corbin Ade, MD;  [facility];  Service: Endoscopy;  Laterality: N/A;  8:30 AM   DORSAL COMPARTMENT RELEASE  04/30/2011   Procedure: RELEASE DORSAL COMPARTMENT (DEQUERVAIN);  Surgeon: Wyn Forster., MD;  Location: [facility];  Service: Orthopedics;  Laterality: Left;  release 1st dorsal compartment on left   INSERT / REPLACE / REMOVE PACEMAKER  07/29/2009   PPM-Dual Chamber Systems Fluoro Guidance Venogram performed 10 ml 's to the left subclavian vein   PACEMAKER INSERTION  07/2009   Dr. Alanda Amass  Pacemaker is a Adapta L implanted 07/2009   PPM GENERATOR CHANGEOUT N/A 10/07/2022   Procedure: PPM GENERATOR CHANGEOUT - DUAL CHAMBER;  Surgeon: Thurmon Fair, MD;  [facility];  Service: Cardiovascular;  Laterality: N/A;   RENAL CIRCULATION  07/ 08/ 08   normal renal duplex doppler    TRANSTHORACIC ECHOCARDIOGRAM  10/18/2012   Atrial tracking & V paced rhythm with pacer induced LBBB. No significant intra -ventricular  dyssynchrony.   TUBAL LIGATION  1986    Prior to Admission medications   Medication Sig Start Date End Date Taking? Authorizing Provider  acetaminophen (TYLENOL) 650 MG CR tablet Take 650 mg by mouth every 8 (eight) hours as needed for pain.   Yes [provider]  aspirin-acetaminophen-caffeine (HEADACHE RELIEF) 250-250-65 MG tablet Take 1 tablet  by mouth every 8 (eight) hours as needed for headache.   Yes [provider]  Calcium Carb-Cholecalciferol (CALCIUM-VITAMIN D) 600-400 MG-UNIT TABS Take 1 tablet by mouth 2 (two) times daily.   Yes [provider]  celecoxib (CELEBREX) 200 MG capsule Take 200 mg by mouth daily.   Yes [provider]  cholecalciferol (VITAMIN D3) 25 MCG (1000 UNIT) tablet Take 1,000 Units by mouth daily.   Yes [provider]  diclofenac Sodium (VOLTAREN) 1 % GEL  Apply 1 Application topically 3 (three) times daily as needed (joint pain).   Yes [provider]  lisinopril (ZESTRIL) 20 MG tablet Take 20 mg by mouth daily.   Yes [provider]  lisinopril-hydrochlorothiazide (ZESTORETIC) 20-12.5 MG tablet Take 1 tablet by mouth daily.   Yes [provider]  Menthol, Topical Analgesic, (BIOFREEZE) 4 % GEL Apply 1 application  topically as needed.   Yes [provider]  metoprolol succinate (TOPROL-XL) 50 MG 24 hr tablet Take 1 tablet (50 mg total) by mouth daily. 08/10/22  Yes Croitoru, Mihai, MD  Multiple Vitamins-Minerals (WOMENS 50+ MULTI VITAMIN/MIN PO) Take 1 tablet by mouth daily.   Yes [provider]  Omega-3 Fatty Acids (FISH OIL) 500 MG CAPS Take 1,000 mg by mouth daily. Taken at [employer] and in the Afternoon (3p)   Yes [provider]  oxyCODONE-acetaminophen (PERCOCET) 5-325 MG tablet Take 1 tablet by mouth every 4 (four) hours as needed for severe pain. 07/07/22 07/07/23 Yes Cheron Schaumann K, PA-C  phenylephrine (EQ NASAL FOUR) 1 % nasal spray Place 1 drop into both nostrils every 6 (six) hours as needed for congestion.   Yes [provider]  rivaroxaban (XARELTO) 20 MG TABS tablet TAKE 1 TABLET BY MOUTH ONCE DAILY WITH SUPPER 10/13/22  Yes Croitoru, Mihai, MD  rosuvastatin (CRESTOR) 10 MG tablet Take 1 tablet (10 mg total) by mouth daily. Patient taking differently: Take 10 mg by mouth at bedtime. 08/02/22  Yes Croitoru, Mihai, MD  valACYclovir (VALTREX) 1000 MG tablet Take 1,000 mg by mouth 2 (two) times daily as needed. 11/17/22  Yes [provider]    Allergies as of 11/30/2022 - Review Complete 11/30/2022  Allergen Reaction Noted   Vicodin [hydrocodone-acetaminophen] Nausea Only 02/23/2012   Lipitor [atorvastatin] Other (See Comments) 07/01/2020    Family History  Problem Relation Age of Onset   Heart disease Mother    Diabetes Father    Heart disease Paternal Grandfather     Colon cancer Maternal Aunt    Thyroid disease Neg Hx     Social History   Socioeconomic History   Marital status: Married    Spouse name: Not on file   Number of children: Not on file   Years of education: Not on file   Highest education level: Not on file  Occupational History   Not on file  Tobacco Use   Smoking status: Former    Packs/day: 1.50    Years: 30.00    Additional pack years: 0.00    Total pack years: 45.00    Types: Cigarettes   Smokeless tobacco: Never  Vaping Use   Vaping Use: Never used  Substance and Sexual Activity   Alcohol use: Yes    Alcohol/week: 7.0 standard drinks of alcohol    Types: 7 Glasses of wine per week   Drug use: No   Sexual activity: Not Currently    Birth control/protection: Post-menopausal, Surgical    Comment: Tubal lig-1st intercourse 72  yo-Fewer than 5 partners  Other Topics Concern   Not on file  Social History Narrative   Not on file   Social Determinants of Health   Financial Resource Strain: Not on file  Food Insecurity: Not on file  Transportation Needs: Not on file  Physical Activity: Not on file  Stress: Not on file  Social Connections: Not on file  Intimate Partner Violence: Not on file    Review of Systems: See HPI, otherwise negative ROS  Physical Exam: BP 135/83 (BP Location: Right Arm, Patient Position: Sitting, Cuff Size: Normal)   Pulse 65   Temp 98 F (36.7 C) (Oral)   Ht 5\' 1"  (1.549 m)   Wt 137 lb 3.2 oz (62.2 kg)   SpO2 96%   BMI 25.92 kg/m  General:   Alert,  Well-developed, well-nourished, pleasant and cooperative in NAD Neck:  Supple; no masses or thyromegaly. No significant cervical adenopathy. Lungs:  Clear throughout to auscultation.   No wheezes, crackles, or rhonchi. No acute distress. Heart: Irregularly irregular rhythm; no murmurs, clicks, rubs,  or gallops. Abdomen: Non-distended, normal bowel sounds.  Soft and nontender without appreciable mass or hepatosplenomegaly.    Impression/Plan: Pleasant 72 year old lady virtually devoid of any GI symptoms here to set up an average risk screening colonoscopy. The risks, benefits, limitations, alternatives and imponderables have been reviewed with the patient. Questions have been answered. All parties are agreeable.   Discussed we will hold her Xarelto for the 2 days prior to the procedure.  Further recommendations to follow.      Notice: This dictation was prepared with Dragon dictation along with smaller phrase technology. Any transcriptional errors that result from this process are unintentional and may not be corrected upon review.

## 2022-12-06 ENCOUNTER — Ambulatory Visit: Payer: Medicare HMO

## 2022-12-06 DIAGNOSIS — I480 Paroxysmal atrial fibrillation: Secondary | ICD-10-CM | POA: Diagnosis not present

## 2022-12-06 DIAGNOSIS — E663 Overweight: Secondary | ICD-10-CM | POA: Diagnosis not present

## 2022-12-06 DIAGNOSIS — Z6825 Body mass index (BMI) 25.0-25.9, adult: Secondary | ICD-10-CM | POA: Diagnosis not present

## 2022-12-06 DIAGNOSIS — M25561 Pain in right knee: Secondary | ICD-10-CM | POA: Diagnosis not present

## 2022-12-06 DIAGNOSIS — M19011 Primary osteoarthritis, right shoulder: Secondary | ICD-10-CM | POA: Diagnosis not present

## 2022-12-13 ENCOUNTER — Other Ambulatory Visit (HOSPITAL_COMMUNITY): Payer: Self-pay | Admitting: Orthopedic Surgery

## 2022-12-13 DIAGNOSIS — M25511 Pain in right shoulder: Secondary | ICD-10-CM

## 2022-12-20 ENCOUNTER — Telehealth: Payer: Self-pay

## 2022-12-20 ENCOUNTER — Telehealth: Payer: Self-pay | Admitting: *Deleted

## 2022-12-20 NOTE — Telephone Encounter (Signed)
Pt LVM in triage line requesting order for DEXA be sent to en external location since ML is leaving the practice.   DEXA due after 03/19/2023. Would prefer to go to location close to residence in [place], [place].   Would you prefer I advise pt to wait until new provider is planned to be in office and reading DEXAs for in-office scans for comparison purposes or advise her that she can have done at [facility] if desired? TIA

## 2022-12-20 NOTE — Telephone Encounter (Signed)
Pt left vm stating she was supposed to be scheduled for colonoscopy with Dr.Rourk for August. Advised pt that we do not have providers schedule at this time, when we get it we will call and schedule her. Pt verbalized understanding.

## 2022-12-21 NOTE — Telephone Encounter (Signed)
Per ML: "Yes, tell patient that she is likely to be able to schedule here in 03/2023 with the new provider and those scans would be comparable, being on the same machine. Dr. Elbert Ewings"   Pt notified and voiced understanding. Pt is ok with waiting until closer to the time that her DEXA scan is due to contact us to see if DEXA is available in office and/or when. Will place recall and send to provider for final review and close.

## 2022-12-29 DIAGNOSIS — I10 Essential (primary) hypertension: Secondary | ICD-10-CM | POA: Diagnosis not present

## 2023-01-04 ENCOUNTER — Encounter: Payer: Self-pay | Admitting: Obstetrics & Gynecology

## 2023-01-04 ENCOUNTER — Other Ambulatory Visit (HOSPITAL_COMMUNITY)
Admission: RE | Admit: 2023-01-04 | Discharge: 2023-01-04 | Disposition: A | Discharge status: Home / Self Care | Payer: Medicare HMO | Source: Ambulatory Visit | Attending: Obstetrics & Gynecology | Admitting: Obstetrics & Gynecology

## 2023-01-04 ENCOUNTER — Ambulatory Visit: Payer: Medicare HMO | Admitting: Obstetrics & Gynecology

## 2023-01-04 ENCOUNTER — Telehealth: Payer: Self-pay | Admitting: *Deleted

## 2023-01-04 VITALS — BP 124/80 | HR 79 | Ht 60.25 in | Wt 138.0 lb

## 2023-01-04 DIAGNOSIS — Z124 Encounter for screening for malignant neoplasm of cervix: Secondary | ICD-10-CM | POA: Diagnosis not present

## 2023-01-04 DIAGNOSIS — B009 Herpesviral infection, unspecified: Secondary | ICD-10-CM | POA: Diagnosis not present

## 2023-01-04 DIAGNOSIS — Z01419 Encounter for gynecological examination (general) (routine) without abnormal findings: Secondary | ICD-10-CM | POA: Insufficient documentation

## 2023-01-04 DIAGNOSIS — Z9289 Personal history of other medical treatment: Secondary | ICD-10-CM

## 2023-01-04 DIAGNOSIS — Z780 Asymptomatic menopausal state: Secondary | ICD-10-CM | POA: Diagnosis not present

## 2023-01-04 DIAGNOSIS — Z9189 Other specified personal risk factors, not elsewhere classified: Secondary | ICD-10-CM | POA: Diagnosis not present

## 2023-01-04 DIAGNOSIS — M8589 Other specified disorders of bone density and structure, multiple sites: Secondary | ICD-10-CM | POA: Diagnosis not present

## 2023-01-04 NOTE — Telephone Encounter (Signed)
LMOVM to call back to schedle TCS with Dr. Jena Gauss ASA 3, hold xarelto x 2 days prior

## 2023-01-04 NOTE — Progress Notes (Signed)
[name] 09/05/1950 [id number]   History:    72 y.o. G2P2L2    RP:  Established patient presenting for annual gyn exam    HPI:  Postmenopausal.  No significant hot flashes or night sweats.  No vaginal bleeding.  Pap test Negative 11/2019. Pap reflex today. Breasts normal.  Mammo Neg 04/2022. Colonoscopy 2013, scheduling this year. Osteopenia on DEXA 02/2021 with T score -1.8, stable BMD overall.  Taking vitamin D and calcium.  BMI 26.73.  Health labs with Fam MD.    Past medical history,surgical history, family history and social history were all reviewed and documented in the EPIC chart.  Gynecologic History No LMP recorded. Patient is postmenopausal.  Obstetric History OB History  Gravida Para Term Preterm AB Living  2 2 2    0 2  SAB IAB Ectopic Multiple Live Births               # Outcome Date GA Lbr Len/2nd Weight Sex Type Anes PTL Lv  2 Term           1 Term              ROS: A ROS was performed and pertinent positives and negatives are included in the history. GENERAL: No fevers or chills. HEENT: No change in vision, no earache, sore throat or sinus congestion. NECK: No pain or stiffness. CARDIOVASCULAR: No chest pain or pressure. No palpitations. PULMONARY: No shortness of breath, cough or wheeze. GASTROINTESTINAL: No abdominal pain, nausea, vomiting or diarrhea, melena or bright red blood per rectum. GENITOURINARY: No urinary frequency, urgency, hesitancy or dysuria. MUSCULOSKELETAL: No joint or muscle pain, no back pain, no recent trauma. DERMATOLOGIC: No rash, no itching, no lesions. ENDOCRINE: No polyuria, polydipsia, no heat or cold intolerance. No recent change in weight. HEMATOLOGICAL: No anemia or easy bruising or bleeding. NEUROLOGIC: No headache, seizures, numbness, tingling or weakness. PSYCHIATRIC: No depression, no loss of interest in normal activity or change in sleep pattern.     Exam:   BP 124/80   Pulse 79   Ht 5' 0.25" (1.53 m)   Wt 138 lb (62.6 kg)    SpO2 98%   BMI 26.73 kg/m   Body mass index is 26.73 kg/m.  General appearance : Well developed well nourished female. No acute distress HEENT: Eyes: no retinal hemorrhage or exudates,  Neck supple, trachea midline, no carotid bruits, no thyroidmegaly Lungs: Clear to auscultation, no rhonchi or wheezes, or rib retractions  Heart: Regular rate and rhythm, no murmurs or gallops Breast:Examined in sitting and supine position were symmetrical in appearance, no palpable masses or tenderness,  no skin retraction, no nipple inversion, no nipple discharge, no skin discoloration, no axillary or supraclavicular lymphadenopathy Abdomen: no palpable masses or tenderness, no rebound or guarding Extremities: no edema or skin discoloration or tenderness  Pelvic: Vulva: Normal             Vagina: No gross lesions or discharge  Cervix: No gross lesions or discharge.  Pap reflex done.  Uterus  AV, normal size, shape and consistency, non-tender and mobile  Adnexa  Without masses or tenderness  Anus: Normal   Assessment/Plan:  72 y.o. female for annual exam   1. Encounter for routine gynecological examination with Papanicolaou smear of cervix Postmenopausal.  No significant hot flashes or night sweats.  No vaginal bleeding.  Pap test Negative 11/2019. Pap reflex today.  Breasts normal.  Mammo Neg 04/2022. Colonoscopy 2013, scheduling this year. Osteopenia on  DEXA 02/2021 with T score -1.8, stable BMD overall.  Taking vitamin D and calcium.  BMI 26.73.  Health labs with Fam MD.  - Cytology - PAP( [place])  2. Postmenopause Postmenopausal.  No significant hot flashes or night sweats.  No vaginal bleeding.    3. Osteopenia of multiple sites Osteopenia on DEXA 02/2021 with T score -1.8, stable BMD overall. Taking vitamin D and calcium.  BMI 26.73.  Repeat BD in 02/2023.  4. Personal history of other medical treatment   Genia Del MD, 9:45 AM

## 2023-01-05 LAB — CYTOLOGY - PAP: Diagnosis: NEGATIVE

## 2023-01-06 ENCOUNTER — Ambulatory Visit: Payer: Medicare HMO

## 2023-01-06 DIAGNOSIS — M11261 Other chondrocalcinosis, right knee: Secondary | ICD-10-CM | POA: Insufficient documentation

## 2023-01-06 DIAGNOSIS — M25561 Pain in right knee: Secondary | ICD-10-CM | POA: Insufficient documentation

## 2023-01-11 ENCOUNTER — Ambulatory Visit (INDEPENDENT_AMBULATORY_CARE_PROVIDER_SITE_OTHER): Payer: Medicare HMO

## 2023-01-11 DIAGNOSIS — I442 Atrioventricular block, complete: Secondary | ICD-10-CM | POA: Diagnosis not present

## 2023-01-12 LAB — CUP PACEART REMOTE DEVICE CHECK
Battery Remaining Longevity: 146 mo
Battery Voltage: 3.18 V
Brady Statistic AP VP Percent: 9.48 %
Brady Statistic AP VS Percent: 0 %
Brady Statistic AS VP Percent: 90.36 %
Brady Statistic AS VS Percent: 0.1 %
Brady Statistic RA Percent Paced: 8.38 %
Brady Statistic RV Percent Paced: 99.91 %
Date Time Interrogation Session: 20240723025126
Implantable Lead Connection Status: 753985
Implantable Lead Connection Status: 753985
Implantable Lead Implant Date: 20110208
Implantable Lead Implant Date: 20110208
Implantable Lead Location: 753859
Implantable Lead Location: 753860
Implantable Lead Model: 5076
Implantable Lead Model: 5076
Implantable Pulse Generator Implant Date: 20240418
Lead Channel Impedance Value: 304 Ohm
Lead Channel Impedance Value: 342 Ohm
Lead Channel Impedance Value: 361 Ohm
Lead Channel Impedance Value: 437 Ohm
Lead Channel Pacing Threshold Amplitude: 0.375 V
Lead Channel Pacing Threshold Amplitude: 1 V
Lead Channel Pacing Threshold Pulse Width: 0.4 ms
Lead Channel Pacing Threshold Pulse Width: 0.4 ms
Lead Channel Sensing Intrinsic Amplitude: 0.75 mV
Lead Channel Sensing Intrinsic Amplitude: 0.75 mV
Lead Channel Setting Pacing Amplitude: 1.5 V
Lead Channel Setting Pacing Amplitude: 2 V
Lead Channel Setting Pacing Pulse Width: 0.4 ms
Lead Channel Setting Sensing Sensitivity: 1.2 mV
Zone Setting Status: 755011

## 2023-01-13 ENCOUNTER — Ambulatory Visit: Payer: Medicare HMO | Attending: Cardiovascular Disease | Admitting: Cardiovascular Disease

## 2023-01-13 ENCOUNTER — Encounter: Payer: Self-pay | Admitting: Cardiovascular Disease

## 2023-01-13 VITALS — BP 154/80 | HR 68 | Ht 60.25 in | Wt 138.6 lb

## 2023-01-13 DIAGNOSIS — Z8639 Personal history of other endocrine, nutritional and metabolic disease: Secondary | ICD-10-CM

## 2023-01-13 DIAGNOSIS — I480 Paroxysmal atrial fibrillation: Secondary | ICD-10-CM | POA: Diagnosis not present

## 2023-01-13 DIAGNOSIS — I442 Atrioventricular block, complete: Secondary | ICD-10-CM

## 2023-01-13 DIAGNOSIS — Z950 Presence of cardiac pacemaker: Secondary | ICD-10-CM

## 2023-01-13 DIAGNOSIS — E7800 Pure hypercholesterolemia, unspecified: Secondary | ICD-10-CM

## 2023-01-13 DIAGNOSIS — D6869 Other thrombophilia: Secondary | ICD-10-CM

## 2023-01-13 DIAGNOSIS — I10 Essential (primary) hypertension: Secondary | ICD-10-CM

## 2023-01-13 MED ORDER — RIVAROXABAN 20 MG PO TABS: 20.0000 mg | ORAL_TABLET | Freq: Every day | ORAL | 0 refills | Status: DC

## 2023-01-13 NOTE — Patient Instructions (Signed)

## 2023-01-13 NOTE — Progress Notes (Signed)
Cardiology Office Note    Date:  01/13/2023   ID:  Sylvia, Santana 1950/11/24, MRN [id number]  PCP:  Assunta Found, MD  Cardiologist:  Nanetta Batty, MD: Thurmon Fair, MD   Chief Complaint  Patient presents with   Follow-up  Pacemaker/complete heart block/atrial fibrillation  History of Present Illness:  [name] is a 72 y.o. female with complete heart block, paroxysmal atrial fibrillation, hypertension, hyperlipidemia returning for follow-up.  Her device was implanted for symptomatic bradycardia and she has a history of left bundle branch block. She subsequently has developed complete heart block and is now pacemaker dependent. Her Medtronic Adapta device was implanted in 2011.  Subsequent to the pacemaker implantation she developed episodes of infrequent paroxysmal atrial fibrillation that have been consistently asymptomatic.  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  The episodes of atrial fibrillation were not clearly associated with a thyroid disorder and have occurred even when she became euthyroid.  Underwent pacemaker generator change out 10/07/2022 Medtronic Azure.  Since undergoing pacemaker change out she has done well.  She has had 2 lengthy episodes of persistent atrial fibrillation, lasting for over a week each and has been completely unaware of the arrhythmia.  She is actually in atrial fibrillation today and is unaware of it.  Overall burden of atrial fibrillation since generator change out is 25%.  She has not had any episodes of high ventricular response.  She has 7% atrial pacing and 99.9% ventricular pacing.  With current settings the device longevity is estimated at just over 12 years.  Heart rate histograms are appropriate.   The patient specifically denies any chest pain at rest exertion, dyspnea at rest or with exertion, orthopnea, paroxysmal nocturnal dyspnea, syncope, palpitations, focal neurological  deficits, intermittent claudication, lower extremity edema, unexplained weight gain, cough, hemoptysis or wheezing.  Denies weight loss, heat intolerance, hair or voice changes or other symptoms of thyrotoxicosis.  She has well-controlled hypertension and takes a statin for hyperlipidemia, with labs monitored via Dr. Phillips Odor. She had a normal nuclear perfusion stress test in May 2017  Past Medical History:  Diagnosis Date   Arthritis    Atrial tachycardia    Atypical chest pain    Bundle branch block left    Dr. Alanda Amass is cardiologist   Elevated cholesterol    HSV-1 infection    Hypertension    Osteopenia 01/2019   T score -1.8   Pacemaker    Paroxysmal atrial fibrillation (HCC)    rrare episodes on pacemaker interrogation   Wears glasses     Past Surgical History:  Procedure Laterality Date   CARDIAC CATHETERIZATION  2005   Ejection fraction is estimated at  greater than or equal to 60 %  Wall motion is normal.   CAROTID DUPLEX BILATERAL  march 08,2005   Impression : No evidence of of signficant plaque formation or stenosis.   CARPAL TUNNEL RELEASE  04/30/2011   Procedure: CARPAL TUNNEL RELEASE;  Surgeon: Wyn Forster., MD;  Location: [facility];  Service: Orthopedics;  Laterality: Left;  left carpal tunnel release, inject left thumb   CARPAL TUNNEL RELEASE  04/28/2012   Procedure: CARPAL TUNNEL RELEASE;  Surgeon: Wyn Forster., MD;  Location: [facility];  Service: Orthopedics;  Laterality: Right;   CATARACT EXTRACTION W/PHACO Right 03/01/2022   Procedure: CATARACT EXTRACTION PHACO AND INTRAOCULAR LENS PLACEMENT (IOC);  Surgeon: Fabio Pierce, MD;  Location: [facility];  Service: Ophthalmology;  Laterality: Right;  CDE: 3.75   CATARACT EXTRACTION W/PHACO Left 03/26/2022   Procedure: CATARACT EXTRACTION PHACO AND INTRAOCULAR LENS PLACEMENT (IOC);  Surgeon: Fabio Pierce, MD;  Location: [facility];  Service: Ophthalmology;  Laterality: Left;  CDE  8.45   COLONOSCOPY     COLONOSCOPY  04/14/2012   Procedure: COLONOSCOPY;  Surgeon: Corbin Ade, MD;  Location: [facility];  Service: Endoscopy;  Laterality: N/A;  8:30 AM   DORSAL COMPARTMENT RELEASE  04/30/2011   Procedure: RELEASE DORSAL COMPARTMENT (DEQUERVAIN);  Surgeon: Wyn Forster., MD;  Location[facility];  Service: Orthopedics;  Laterality: Left;  release 1st dorsal compartment on left   INSERT / REPLACE / REMOVE PACEMAKER  07/29/2009   PPM-Dual Chamber Systems Fluoro Guidance Venogram performed 10 ml 's to the left subclavian vein   PACEMAKER INSERTION  07/2009   Dr. Alanda Amass  Pacemaker is a Adapta L implanted 07/2009   PPM GENERATOR CHANGEOUT N/A 10/07/2022   Procedure: PPM GENERATOR CHANGEOUT - DUAL CHAMBER;  Surgeon: Thurmon Fair, MD;  Location: [facility];  Service: Cardiovascular;  Laterality: N/A;   RENAL CIRCULATION  07/ 08/ 08   normal renal duplex doppler    TRANSTHORACIC ECHOCARDIOGRAM  10/18/2012   Atrial tracking & V paced rhythm with pacer induced LBBB. No significant intra -ventricular  dyssynchrony.   TUBAL LIGATION  1986    Current Medications: Outpatient Medications Prior to Visit  Medication Sig Dispense Refill   acetaminophen (TYLENOL) 650 MG CR tablet Take 650 mg by mouth every 8 (eight) hours as needed for pain.     aspirin-acetaminophen-caffeine (HEADACHE RELIEF) 250-250-65 MG tablet Take 1 tablet by mouth every 8 (eight) hours as needed for headache.     Calcium Carb-Cholecalciferol (CALCIUM-VITAMIN D) 600-400 MG-UNIT TABS Take 1 tablet by mouth 2 (two) times daily.     cholecalciferol (VITAMIN D3) 25 MCG (1000 UNIT) tablet Take 1,000 Units by mouth daily.     diclofenac Sodium (VOLTAREN) 1 % GEL Apply 1 Application topically 3 (three) times daily as needed (joint pain).     lisinopril (ZESTRIL) 20 MG tablet Take 20 mg by mouth daily.     lisinopril-hydrochlorothiazide (ZESTORETIC) 20-12.5 MG tablet Take 1 tablet by  mouth daily.     Menthol, Topical Analgesic, (BIOFREEZE) 4 % GEL Apply 1 application  topically as needed.     metoprolol succinate (TOPROL-XL) 50 MG 24 hr tablet Take 1 tablet (50 mg total) by mouth daily. 90 tablet 2   Multiple Vitamins-Minerals (WOMENS 50+ MULTI VITAMIN/MIN PO) Take 1 tablet by mouth daily.     Omega-3 Fatty Acids (FISH OIL) 500 MG CAPS Take 500 mg by mouth daily. Taken at [employer] and in the Afternoon (3p)     phenylephrine (EQ NASAL FOUR) 1 % nasal spray Place 1 drop into both nostrils every 6 (six) hours as needed for congestion.     rivaroxaban (XARELTO) 20 MG TABS tablet TAKE 1 TABLET BY MOUTH ONCE DAILY WITH SUPPER 30 tablet 5   rosuvastatin (CRESTOR) 10 MG tablet Take 1 tablet (10 mg total) by mouth daily. (Patient taking differently: Take 10 mg by mouth at bedtime.) 90 tablet 2   valACYclovir (VALTREX) 1000 MG tablet Take 1,000 mg by mouth 2 (two) times daily as needed.     celecoxib (CELEBREX) 200 MG capsule Take 200 mg by mouth daily. (Patient not taking: Reported on 01/13/2023)     oxyCODONE-acetaminophen (PERCOCET) 5-325 MG tablet  Take 1 tablet by mouth every 4 (four) hours as needed for severe pain. (Patient not taking: Reported on 01/13/2023) 15 tablet 0   No facility-administered medications prior to visit.     Allergies:   Vicodin [hydrocodone-acetaminophen] and Lipitor [atorvastatin]   Social History   Socioeconomic History   Marital status: Married    Spouse name: Not on file   Number of children: Not on file   Years of education: Not on file   Highest education level: Not on file  Occupational History   Not on file  Tobacco Use   Smoking status: Former    Current packs/day: 1.50    Average packs/day: 1.5 packs/day for 30.0 years (45.0 ttl pk-yrs)    Types: Cigarettes   Smokeless tobacco: Never  Vaping Use   Vaping status: Never Used  Substance and Sexual Activity   Alcohol use: Yes    Alcohol/week: 7.0 standard drinks of alcohol    Types: 7  Glasses of wine per week   Drug use: No   Sexual activity: Not Currently    Birth control/protection: Post-menopausal, Surgical    Comment: Tubal lig-1st intercourse 72 yo-Fewer than 5 partners  Other Topics Concern   Not on file  Social History Narrative   Not on file   Social Determinants of Health   Financial Resource Strain: Not on file  Food Insecurity: Not on file  Transportation Needs: Not on file  Physical Activity: Not on file  Stress: Not on file  Social Connections: Not on file     Family History:  The patient's family history includes Colon cancer in her maternal aunt; Diabetes in her father; Heart disease in her mother and paternal grandfather.   ROS:   Please see the history of present illness.    ROS All other systems are reviewed and are negative.   PHYSICAL EXAM:   VS:  BP (!) 154/80 (BP Location: Left Arm, Patient Position: Sitting, Cuff Size: Normal)   Pulse 68   Ht 5' 0.25" (1.53 m)   Wt 138 lb 9.6 oz (62.9 kg)   SpO2 93%   BMI 26.84 kg/m      General: Alert, oriented x3, no distress, appears well and younger than stated age.  Healthy left subclavian pacemaker site. Head: no evidence of trauma, PERRL, EOMI, no exophtalmos or lid lag, no myxedema, no xanthelasma; normal ears, nose and oropharynx Neck: normal jugular venous pulsations and no hepatojugular reflux; brisk carotid pulses without delay and no carotid bruits Chest: clear to auscultation, no signs of consolidation by percussion or palpation, normal fremitus, symmetrical and full respiratory excursions Cardiovascular: normal position and quality of the apical impulse, regular rhythm, normal first and second heart sounds, no murmurs, rubs or gallops Abdomen: no tenderness or distention, no masses by palpation, no abnormal pulsatility or arterial bruits, normal bowel sounds, no hepatosplenomegaly Extremities: no clubbing, cyanosis or edema; 2+ radial, ulnar and brachial pulses bilaterally; 2+ right  femoral, posterior tibial and dorsalis pedis pulses; 2+ left femoral, posterior tibial and dorsalis pedis pulses; no subclavian or femoral bruits Neurological: grossly nonfocal Psych: Normal mood and affect     Wt Readings from Last 3 Encounters:  01/13/23 138 lb 9.6 oz (62.9 kg)  01/04/23 138 lb (62.6 kg)  11/30/22 137 lb 3.2 oz (62.2 kg)      Studies/Labs Reviewed:   EKG: Ordered today shows background atrial flutter in the 100% ventricular paced rhythm.  Recent Labs: 10/06/2022: BUN 20; Creatinine, Ser 0.94; Hemoglobin  12.9; Platelets 225; Potassium 4.3; Sodium 143 10/26/2022: TSH 2.67   Lipid Panel    Component Value Date/Time   CHOL 187 04/28/2020 0813   TRIG 154 (H) 04/28/2020 0813   HDL 55 04/28/2020 0813   CHOLHDL 3.4 04/28/2020 0813   CHOLHDL 3.3 03/28/2015 0859   VLDL 27 03/28/2015 0859   LDLCALC 105 (H) 04/28/2020 0813    April 17, 2019 Total cholesterol 171, HDL 55, LDL 88, triglycerides 162 Hemoglobin A1c 5.5% Hemoglobin 14.4, creatinine 0.87, potassium 5.3, normal liver function tests TSH 3.32 on 12/18/2019  07/11/2021 Cholesterol 151, HDL 55, LDL 73, triglycerides 130 Hemoglobin A1c 5.8% Hemoglobin 13.0, creatinine 0.89, potassium 4.3, ALT 25  12/09/2021 TSH 2.510 ASSESSMENT:    1. CHB (complete heart block) (HCC)   2. Pacemaker   3. Paroxysmal atrial fibrillation (HCC)   4. Acquired thrombophilia (HCC)   5. Essential hypertension   6. Hypercholesterolemia   7. History of hyperthyroidism      PLAN:  In order of problems listed above:  CHB: No underlying escape rhythm.  Pacemaker dependent. PPM: MRI conditional system.  Normal device function.  Continue remote downloads every 3 months. AFib: There has been a substantial increase in the burden of atrial fibrillation which is now up to about 25%, compared with less than 1% last year.  Remains completely asymptomatic.  On appropriate anticoagulation.  CHADVasc score of 3 (age, gender,  HTN). Xarelto: No falls or bleeding incidents..  Can consider switching to Eliquis if GI bleeding becomes an issue. HTN: Little high today, but was only 120/80 a few days ago.  No changes made to medications.Marland Kitchen HLP: Excellent lipid measures. Hyperthyroidism: Clinically euthyroid.  Roughly 2 months ago TSH was 2.670, off methimazole.  Not sure if the increased burden of arrhythmia can be attributed to recurrent hyperthyroidism.  Defer to her endocrinologist.    Medication Adjustments/Labs and Tests Ordered: Current medicines are reviewed at length with the patient today.  Concerns regarding medicines are outlined above.  Medication changes, Labs and Tests ordered today are listed in the Patient Instructions below. Patient Instructions  Medication Instructions:  No changes *If you need a refill on your cardiac medications before your next appointment, please call your pharmacy*  Follow-Up: At [facility], you and your health needs are our priority.  As part of our continuing mission to provide you with exceptional heart care, we have created designated Provider Care Teams.  These Care Teams include your primary Cardiologist (physician) and Advanced Practice Providers (APPs -  Physician Assistants and Nurse Practitioners) who all work together to provide you with the care you need, when you need it.  We recommend signing up for the patient portal called "MyChart".  Sign up information is provided on this After Visit Summary.  MyChart is used to connect with patients for Virtual Visits (Telemedicine).  Patients are able to view lab/test results, encounter notes, upcoming appointments, etc.  Non-urgent messages can be sent to your provider as well.   To learn more about what you can do with MyChart, go to [email or web address].    Your next appointment:   1 year(s)  Provider:   Thurmon Fair, MD       Signed, Thurmon Fair, MD  01/13/2023 4:50 PM    [facility]  [facility] [address] [place], [place], [place]  [address] Phone: [phone]; Fax: [phone]

## 2023-01-18 ENCOUNTER — Encounter: Payer: Self-pay | Admitting: Internal Medicine

## 2023-01-18 ENCOUNTER — Telehealth: Payer: Self-pay | Admitting: Internal Medicine

## 2023-01-18 ENCOUNTER — Other Ambulatory Visit: Payer: Self-pay | Admitting: Internal Medicine

## 2023-01-18 DIAGNOSIS — E0590 Thyrotoxicosis, unspecified without thyrotoxic crisis or storm: Secondary | ICD-10-CM

## 2023-01-18 NOTE — Telephone Encounter (Signed)
Patient called back to say that she does not want to come in for labs until she has spoken with someone about the reason why she needs them in the first place.  Patient is scheduled for tomorrow, 01/19/2023, and wants to cancel for the previous reason.  I have left the appointment scheduled at this time.

## 2023-01-18 NOTE — Telephone Encounter (Signed)
Patient contacted and is coming in tomorrow for Labs per Dr. Vito Berger request

## 2023-01-19 ENCOUNTER — Other Ambulatory Visit (INDEPENDENT_AMBULATORY_CARE_PROVIDER_SITE_OTHER): Payer: Medicare HMO

## 2023-01-19 DIAGNOSIS — E0590 Thyrotoxicosis, unspecified without thyrotoxic crisis or storm: Secondary | ICD-10-CM | POA: Diagnosis not present

## 2023-01-19 LAB — TSH: TSH: 2.99 u[IU]/mL (ref 0.35–5.50)

## 2023-01-19 LAB — T3, FREE: T3, Free: 3.3 pg/mL (ref 2.3–4.2)

## 2023-01-19 LAB — T4, FREE: Free T4: 0.99 ng/dL (ref 0.60–1.60)

## 2023-01-27 NOTE — Progress Notes (Signed)
Remote pacemaker transmission.   

## 2023-01-28 NOTE — Telephone Encounter (Signed)
Called pt to schedule. None of dates for sept worked for her. She will wait for Oct schedule.

## 2023-02-07 ENCOUNTER — Ambulatory Visit: Payer: Medicare HMO

## 2023-02-19 ENCOUNTER — Encounter: Payer: Self-pay | Admitting: Cardiovascular Disease

## 2023-02-28 ENCOUNTER — Ambulatory Visit (HOSPITAL_COMMUNITY)
Admission: RE | Admit: 2023-02-28 | Discharge: 2023-02-28 | Disposition: A | Discharge status: Home / Self Care | Payer: Medicare HMO | Source: Ambulatory Visit | Attending: Orthopedic Surgery | Admitting: Orthopedic Surgery

## 2023-02-28 DIAGNOSIS — M25511 Pain in right shoulder: Secondary | ICD-10-CM | POA: Insufficient documentation

## 2023-02-28 DIAGNOSIS — M75121 Complete rotator cuff tear or rupture of right shoulder, not specified as traumatic: Secondary | ICD-10-CM | POA: Diagnosis not present

## 2023-02-28 DIAGNOSIS — S46811A Strain of other muscles, fascia and tendons at shoulder and upper arm level, right arm, initial encounter: Secondary | ICD-10-CM | POA: Diagnosis not present

## 2023-02-28 DIAGNOSIS — M7581 Other shoulder lesions, right shoulder: Secondary | ICD-10-CM | POA: Diagnosis not present

## 2023-03-03 NOTE — Telephone Encounter (Signed)
LMOVM to call back 

## 2023-03-04 MED ORDER — PEG 3350-KCL-NA BICARB-NACL 420 G PO SOLR: 4000.0000 mL | Freq: Once | ORAL | 0 refills | Status: AC

## 2023-03-04 NOTE — Addendum Note (Signed)
Addended by: Armstead Peaks on: 03/04/2023 08:53 AM   Modules accepted: Orders

## 2023-03-07 ENCOUNTER — Encounter: Payer: Self-pay | Admitting: *Deleted

## 2023-03-15 DIAGNOSIS — H6500 Acute serous otitis media, unspecified ear: Secondary | ICD-10-CM | POA: Diagnosis not present

## 2023-03-15 DIAGNOSIS — Z6826 Body mass index (BMI) 26.0-26.9, adult: Secondary | ICD-10-CM | POA: Diagnosis not present

## 2023-03-15 DIAGNOSIS — E663 Overweight: Secondary | ICD-10-CM | POA: Diagnosis not present

## 2023-03-17 ENCOUNTER — Other Ambulatory Visit (HOSPITAL_COMMUNITY): Payer: Self-pay | Admitting: Family Medicine

## 2023-03-17 ENCOUNTER — Other Ambulatory Visit: Payer: Self-pay

## 2023-03-17 DIAGNOSIS — Z1231 Encounter for screening mammogram for malignant neoplasm of breast: Secondary | ICD-10-CM

## 2023-03-17 DIAGNOSIS — M8589 Other specified disorders of bone density and structure, multiple sites: Secondary | ICD-10-CM

## 2023-03-25 DIAGNOSIS — Z23 Encounter for immunization: Secondary | ICD-10-CM | POA: Diagnosis not present

## 2023-04-07 DIAGNOSIS — M25511 Pain in right shoulder: Secondary | ICD-10-CM | POA: Diagnosis not present

## 2023-04-08 NOTE — Patient Instructions (Signed)
[name]  04/08/2023     @PREFPERIOPPHARMACY @   Your procedure is scheduled on  04/13/2023.    [facility] to [facility] at  0700  A.M.    Call this number if you have problems the morning of surgery:  [phone]  If you experience any cold or flu symptoms such as cough, fever, chills, shortness of breath, etc. between now and your scheduled surgery, please notify us at the above number.   Remember:  Follow the diet and prep instructions given to you by the office.   You may drink clear liquids until 0500 am on 04/13/2023.   Clear liquids allowed are:                    Water, Juice (No red color; non-citric and without pulp; diabetics please choose diet or no sugar options), Carbonated beverages (diabetics please choose diet or no sugar options), Clear Tea (No creamer, milk, or cream, including half & half and powdered creamer), Black Coffee Only (No creamer, milk or cream, including half & half and powdered creamer), and Clear Sports drink (No red color; diabetics please choose diet or no sugar options)     Take these medicines the morning of surgery with A SIP OF WATER                                            metoprolol.     Do not wear jewelry, make-up or nail polish, including gel polish,  artificial nails, or any other type of covering on natural nails (fingers and  toes).  Do not wear lotions, powders, or perfumes, or deodorant.  Do not shave 48 hours prior to surgery.  Men may shave face and neck.  Do not bring valuables to the hospital.  [facility] is not responsible for any belongings or valuables.  Contacts, dentures or bridgework may not be worn into surgery.  Leave your suitcase in the car.  After surgery it may be brought to your room.  For patients admitted to the hospital, discharge time will be determined by your treatment team.  Patients discharged the day of surgery will not be allowed to drive home and must have someone with them for 24  hours.    Special instructions:   DO NOT smoke tobacco or vape for 24 hours before your procedure.  Please read over the following fact sheets that you were given. Anesthesia Post-op Instructions and Care and Recovery After Surgery      Colonoscopy, Adult, Care After The following information offers guidance on how to care for yourself after your procedure. Your health care provider may also give you more specific instructions. If you have problems or questions, contact your health care provider. What can I expect after the procedure? After the procedure, it is common to have: A small amount of blood in your stool for 24 hours after the procedure. Some gas. Mild cramping or bloating of your abdomen. Follow these instructions at home: Eating and drinking  Drink enough fluid to keep your urine pale yellow. Follow instructions from your health care provider about eating or drinking restrictions. Resume your normal diet as told by your health care provider. Avoid heavy or fried foods that are hard to digest. Activity Rest as told by your health care provider. Avoid sitting for a long  time without moving. Get up to take short walks every 1-2 hours. This is important to improve blood flow and breathing. Ask for help if you feel weak or unsteady. Return to your normal activities as told by your health care provider. Ask your health care provider what activities are safe for you. Managing cramping and bloating  Try walking around when you have cramps or feel bloated. If directed, apply heat to your abdomen as told by your health care provider. Use the heat source that your health care provider recommends, such as a moist heat pack or a heating pad. Place a towel between your skin and the heat source. Leave the heat on for 20-30 minutes. Remove the heat if your skin turns bright red. This is especially important if you are unable to feel pain, heat, or cold. You have a greater risk of getting  burned. General instructions If you were given a sedative during the procedure, it can affect you for several hours. Do not drive or operate machinery until your health care provider says that it is safe. For the first 24 hours after the procedure: Do not sign important documents. Do not drink alcohol. Do your regular daily activities at a slower pace than normal. Eat soft foods that are easy to digest. Take over-the-counter and prescription medicines only as told by your health care provider. Keep all follow-up visits. This is important. Contact a health care provider if: You have blood in your stool 2-3 days after the procedure. Get help right away if: You have more than a small spotting of blood in your stool. You have large blood clots in your stool. You have swelling of your abdomen. You have nausea or vomiting. You have a fever. You have increasing pain in your abdomen that is not relieved with medicine. These symptoms may be an emergency. Get help right away. Call 911. Do not wait to see if the symptoms will go away. Do not drive yourself to the hospital. Summary After the procedure, it is common to have a small amount of blood in your stool. You may also have mild cramping and bloating of your abdomen. If you were given a sedative during the procedure, it can affect you for several hours. Do not drive or operate machinery until your health care provider says that it is safe. Get help right away if you have a lot of blood in your stool, nausea or vomiting, a fever, or increased pain in your abdomen. This information is not intended to replace advice given to you by your health care provider. Make sure you discuss any questions you have with your health care provider. Document Revised: 07/20/2022 Document Reviewed: 01/28/2021 Elsevier Patient Education  2024 [employer] Monitored Anesthesia Care, Care After The following information offers guidance on how to care for yourself  after your procedure. Your health care provider may also give you more specific instructions. If you have problems or questions, contact your health care provider. What can I expect after the procedure? After the procedure, it is common to have: Tiredness. Little or no memory about what happened during or after the procedure. Impaired judgment when it comes to making decisions. Nausea or vomiting. Some trouble with balance. Follow these instructions at home: For the time period you were told by your health care provider:  Rest. Do not participate in activities where you could fall or become injured. Do not drive or use machinery. Do not drink alcohol. Do not take sleeping pills or  medicines that cause drowsiness. Do not make important decisions or sign legal documents. Do not take care of children on your own. Medicines Take over-the-counter and prescription medicines only as told by your health care provider. If you were prescribed antibiotics, take them as told by your health care provider. Do not stop using the antibiotic even if you start to feel better. Eating and drinking Follow instructions from your health care provider about what you may eat and drink. Drink enough fluid to keep your urine pale yellow. If you vomit: Drink clear fluids slowly and in small amounts as you are able. Clear fluids include water, ice chips, low-calorie sports drinks, and fruit juice that has water added to it (diluted fruit juice). Eat light and bland foods in small amounts as you are able. These foods include bananas, applesauce, rice, lean meats, toast, and crackers. General instructions  Have a responsible adult stay with you for the time you are told. It is important to have someone help care for you until you are awake and alert. If you have sleep apnea, surgery and some medicines can increase your risk for breathing problems. Follow instructions from your health care provider about wearing your  sleep device: When you are sleeping. This includes during daytime naps. While taking prescription pain medicines, sleeping medicines, or medicines that make you drowsy. Do not use any products that contain nicotine or tobacco. These products include cigarettes, chewing tobacco, and vaping devices, such as e-cigarettes. If you need help quitting, ask your health care provider. Contact a health care provider if: You feel nauseous or vomit every time you eat or drink. You feel light-headed. You are still sleepy or having trouble with balance after 24 hours. You get a rash. You have a fever. You have redness or swelling around the IV site. Get help right away if: You have trouble breathing. You have new confusion after you get home. These symptoms may be an emergency. Get help right away. Call 911. Do not wait to see if the symptoms will go away. Do not drive yourself to the hospital. This information is not intended to replace advice given to you by your health care provider. Make sure you discuss any questions you have with your health care provider. Document Revised: 11/02/2021 Document Reviewed: 11/02/2021 Elsevier Patient Education  2024 [employer].

## 2023-04-11 ENCOUNTER — Other Ambulatory Visit: Payer: Self-pay

## 2023-04-11 ENCOUNTER — Encounter (HOSPITAL_COMMUNITY)
Admission: RE | Admit: 2023-04-11 | Discharge: 2023-04-11 | Disposition: A | Discharge status: Home / Self Care | Payer: Medicare HMO | Source: Ambulatory Visit | Attending: Internal Medicine

## 2023-04-11 ENCOUNTER — Encounter (HOSPITAL_COMMUNITY): Payer: Self-pay

## 2023-04-11 VITALS — BP 135/75 | HR 62 | Temp 97.9°F

## 2023-04-11 DIAGNOSIS — Z01818 Encounter for other preprocedural examination: Secondary | ICD-10-CM | POA: Insufficient documentation

## 2023-04-11 DIAGNOSIS — I10 Essential (primary) hypertension: Secondary | ICD-10-CM | POA: Diagnosis not present

## 2023-04-11 DIAGNOSIS — I4719 Other supraventricular tachycardia: Secondary | ICD-10-CM | POA: Diagnosis not present

## 2023-04-11 DIAGNOSIS — Z79899 Other long term (current) drug therapy: Secondary | ICD-10-CM | POA: Insufficient documentation

## 2023-04-11 LAB — BASIC METABOLIC PANEL
Anion gap: 7 (ref 5–15)
BUN: 16 mg/dL (ref 8–23)
CO2: 25 mmol/L (ref 22–32)
Calcium: 8.6 mg/dL — ABNORMAL LOW (ref 8.9–10.3)
Chloride: 105 mmol/L (ref 98–111)
Creatinine, Ser: 0.89 mg/dL (ref 0.44–1.00)
GFR, Estimated: >60 mL/min (ref >60)
Glucose, Bld: 104 mg/dL — ABNORMAL HIGH (ref 70–99)
Potassium: 3.4 mmol/L — ABNORMAL LOW (ref 3.5–5.1)
Sodium: 137 mmol/L (ref 135–145)

## 2023-04-12 ENCOUNTER — Ambulatory Visit (INDEPENDENT_AMBULATORY_CARE_PROVIDER_SITE_OTHER): Payer: Medicare HMO

## 2023-04-12 DIAGNOSIS — I480 Paroxysmal atrial fibrillation: Secondary | ICD-10-CM | POA: Diagnosis not present

## 2023-04-12 NOTE — Anesthesia Preprocedure Evaluation (Signed)
Anesthesia Evaluation  Patient identified by MRN, date of birth, ID band Patient awake    Reviewed: Allergy & Precautions, NPO status , Patient's Chart, lab work & pertinent test results, reviewed documented beta blocker date and time   History of Anesthesia Complications Negative for: history of anesthetic complications  Airway Mallampati: II  TM Distance: >3 FB Neck ROM: Full    Dental no notable dental hx. (+) Dental Advisory Given, Caps, Teeth Intact   Pulmonary neg pulmonary ROS, former smoker   Pulmonary exam normal breath sounds clear to auscultation       Cardiovascular Exercise Tolerance: Good hypertension, Pt. on medications and Pt. on home beta blockers Normal cardiovascular exam+ dysrhythmias Atrial Fibrillation + pacemaker (complete heart block)  Rhythm:Regular Rate:Normal  LBBB. Atrial tachycardia.  AT CP   Neuro/Psych negative neurological ROS  negative psych ROS   GI/Hepatic negative GI ROS, Neg liver ROS,,,  Endo/Other   Hyperthyroidism   Renal/GU negative Renal ROS  negative genitourinary   Musculoskeletal  (+) Arthritis , Osteoarthritis,    Abdominal   Peds negative pediatric ROS (+)  Hematology negative hematology ROS (+)   Anesthesia Other Findings   Reproductive/Obstetrics negative OB ROS                             Anesthesia Physical Anesthesia Plan  ASA: 3  Anesthesia Plan: General   Post-op Pain Management: Minimal or no pain anticipated   Induction: Intravenous  PONV Risk Score and Plan: Propofol infusion  Airway Management Planned: Nasal Cannula and Natural Airway  Additional Equipment: None  Intra-op Plan:   Post-operative Plan:   Informed Consent: I have reviewed the patients History and Physical, chart, labs and discussed the procedure including the risks, benefits and alternatives for the proposed anesthesia with the patient or authorized  representative who has indicated his/her understanding and acceptance.     Dental advisory given  Plan Discussed with: CRNA  Anesthesia Plan Comments:         Anesthesia Quick Evaluation

## 2023-04-13 ENCOUNTER — Ambulatory Visit (HOSPITAL_COMMUNITY): Payer: Self-pay | Admitting: Certified Registered Nurse Anesthetist

## 2023-04-13 ENCOUNTER — Encounter (HOSPITAL_COMMUNITY): Payer: Self-pay | Admitting: Internal Medicine

## 2023-04-13 ENCOUNTER — Ambulatory Visit (HOSPITAL_COMMUNITY)
Admission: RE | Admit: 2023-04-13 | Discharge: 2023-04-13 | Disposition: A | Discharge status: Home / Self Care | Payer: Medicare HMO | Attending: Internal Medicine | Admitting: Internal Medicine

## 2023-04-13 ENCOUNTER — Encounter (HOSPITAL_COMMUNITY)
Admission: RE | Disposition: A | Discharge status: Home / Self Care | Payer: Self-pay | Source: Home / Self Care | Attending: Internal Medicine

## 2023-04-13 DIAGNOSIS — I480 Paroxysmal atrial fibrillation: Secondary | ICD-10-CM | POA: Diagnosis not present

## 2023-04-13 DIAGNOSIS — I4891 Unspecified atrial fibrillation: Secondary | ICD-10-CM

## 2023-04-13 DIAGNOSIS — I442 Atrioventricular block, complete: Secondary | ICD-10-CM | POA: Insufficient documentation

## 2023-04-13 DIAGNOSIS — Z1211 Encounter for screening for malignant neoplasm of colon: Secondary | ICD-10-CM

## 2023-04-13 DIAGNOSIS — I10 Essential (primary) hypertension: Secondary | ICD-10-CM | POA: Insufficient documentation

## 2023-04-13 DIAGNOSIS — D120 Benign neoplasm of cecum: Secondary | ICD-10-CM | POA: Insufficient documentation

## 2023-04-13 DIAGNOSIS — Z7901 Long term (current) use of anticoagulants: Secondary | ICD-10-CM | POA: Insufficient documentation

## 2023-04-13 DIAGNOSIS — I4892 Unspecified atrial flutter: Secondary | ICD-10-CM | POA: Diagnosis not present

## 2023-04-13 DIAGNOSIS — Z87891 Personal history of nicotine dependence: Secondary | ICD-10-CM | POA: Insufficient documentation

## 2023-04-13 DIAGNOSIS — D126 Benign neoplasm of colon, unspecified: Secondary | ICD-10-CM

## 2023-04-13 DIAGNOSIS — K635 Polyp of colon: Secondary | ICD-10-CM | POA: Diagnosis not present

## 2023-04-13 DIAGNOSIS — Z950 Presence of cardiac pacemaker: Secondary | ICD-10-CM | POA: Insufficient documentation

## 2023-04-13 HISTORY — PX: COLONOSCOPY WITH PROPOFOL: SHX5780

## 2023-04-13 HISTORY — PX: POLYPECTOMY: SHX5525

## 2023-04-13 LAB — CUP PACEART REMOTE DEVICE CHECK
Battery Remaining Longevity: 144 mo
Battery Voltage: 3.13 V
Brady Statistic AP VP Percent: 5.46 %
Brady Statistic AP VS Percent: 0 %
Brady Statistic AS VP Percent: 94.06 %
Brady Statistic AS VS Percent: 0.05 %
Brady Statistic RA Percent Paced: 1.15 %
Brady Statistic RV Percent Paced: 99.9 %
Date Time Interrogation Session: 20241021220227
Implantable Lead Connection Status: 753985
Implantable Lead Connection Status: 753985
Implantable Lead Implant Date: 20110208
Implantable Lead Implant Date: 20110208
Implantable Lead Location: 753859
Implantable Lead Location: 753860
Implantable Lead Model: 5076
Implantable Lead Model: 5076
Implantable Pulse Generator Implant Date: 20240418
Lead Channel Impedance Value: 342 Ohm
Lead Channel Impedance Value: 361 Ohm
Lead Channel Impedance Value: 399 Ohm
Lead Channel Impedance Value: 437 Ohm
Lead Channel Pacing Threshold Amplitude: 0.375 V
Lead Channel Pacing Threshold Amplitude: 0.875 V
Lead Channel Pacing Threshold Pulse Width: 0.4 ms
Lead Channel Pacing Threshold Pulse Width: 0.4 ms
Lead Channel Sensing Intrinsic Amplitude: 1.125 mV
Lead Channel Sensing Intrinsic Amplitude: 1.125 mV
Lead Channel Setting Pacing Amplitude: 1.5 V
Lead Channel Setting Pacing Amplitude: 2 V
Lead Channel Setting Pacing Pulse Width: 0.4 ms
Lead Channel Setting Sensing Sensitivity: 1.2 mV
Zone Setting Status: 755011

## 2023-04-13 SURGERY — COLONOSCOPY WITH PROPOFOL
Anesthesia: General

## 2023-04-13 MED ORDER — PROPOFOL 500 MG/50ML IV EMUL
INTRAVENOUS | Status: DC | PRN
  Administered 2023-04-13: 150 ug/kg/min via INTRAVENOUS

## 2023-04-13 MED ORDER — PROPOFOL 500 MG/50ML IV EMUL
INTRAVENOUS | Status: AC
  Filled 2023-04-13: qty 50

## 2023-04-13 MED ORDER — LACTATED RINGERS IV SOLN: INTRAVENOUS | Status: DC | PRN

## 2023-04-13 MED ORDER — PROPOFOL 10 MG/ML IV BOLUS
INTRAVENOUS | Status: DC | PRN
  Administered 2023-04-13: 60 mg via INTRAVENOUS

## 2023-04-13 MED ORDER — STERILE WATER FOR IRRIGATION IR SOLN
Status: DC | PRN
  Administered 2023-04-13: 60 mL

## 2023-04-13 NOTE — Op Note (Signed)
[facility] Patient Name: [name] Procedure Date: 04/13/2023 8:54 AM MRN: [id number] Date of Birth: 03/27/1951 Attending MD: Gennette Pac , MD, [id number] CSN: [id number] Age: 72 Admit Type: Outpatient Procedure:                Colonoscopy Indications:              Screening for colorectal malignant neoplasm Providers:                Gennette Pac, MD, Sheran Fava,                            Zena Amos Referring MD:              Medicines:                Propofol per Anesthesia Complications:            No immediate complications. Estimated Blood Loss:     Estimated blood loss was minimal. Procedure:                Pre-Anesthesia Assessment:                           - Prior to the procedure, a History and Physical                            was performed, and patient medications and                            allergies were reviewed. The patient's tolerance of                            previous anesthesia was also reviewed. The risks                            and benefits of the procedure and the sedation                            options and risks were discussed with the patient.                            All questions were answered, and informed consent                            was obtained. Prior Anticoagulants: The patient                            last took Eliquis (apixaban) 3 days prior to the                            procedure. ASA Grade Assessment: III - A patient                            with severe systemic disease. After reviewing the  risks and benefits, the patient was deemed in                            satisfactory condition to undergo the procedure.                           After obtaining informed consent, the colonoscope                            was passed under direct vision. Throughout the                            procedure, the patient's blood pressure, pulse, and                             oxygen saturations were monitored continuously. The                            [id number]) scope was introduced through the                            anus and advanced to the the cecum, identified by                            appendiceal orifice and ileocecal valve. The                            colonoscopy was performed without difficulty. The                            patient tolerated the procedure well. The quality                            of the bowel preparation was adequate. The                            ileocecal valve, appendiceal orifice, and rectum                            were photographed. Scope In: 9:16:54 AM Scope Out: 9:30:38 AM Scope Withdrawal Time: 0 hours 6 minutes 16 seconds  Total Procedure Duration: 0 hours 13 minutes 44 seconds  Findings:      The perianal and digital rectal examinations were normal.      A 3 mm polyp was found in the cecum. The polyp was sessile. The polyp       was removed with a cold snare. Resection and retrieval were complete.       Estimated blood loss was minimal.      The exam was otherwise without abnormality on direct and retroflexion       views. Impression:               - One 3 mm polyp in the cecum, removed with a cold  snare. Resected and retrieved.                           - The examination was otherwise normal on direct                            and retroflexion views. Moderate Sedation:      Moderate (conscious) sedation was personally administered by an       anesthesia professional. The following parameters were monitored: oxygen       saturation, heart rate, blood pressure, respiratory rate, EKG, adequacy       of pulmonary ventilation, and response to care. Recommendation:           - Patient has a contact number available for                            emergencies. The signs and symptoms of potential                            delayed complications were discussed with the                             patient. Return to normal activities tomorrow.                            Written discharge instructions were provided to the                            patient.                           - Resume previous diet.                           - Continue present medications. Resume Eliquis                            tomorrow.                           - Repeat colonoscopy date to be determined after                            pending pathology results are reviewed for                            surveillance.                           - Return to GI office (date not yet determined). Procedure Code(s):        --- Professional ---                           [id number], Colonoscopy, flexible; with removal of                            tumor(s), polyp(s),  or other lesion(s) by snare                            technique Diagnosis Code(s):        --- Professional ---                           Z12.11, Encounter for screening for malignant                            neoplasm of colon                           D12.0, Benign neoplasm of cecum CPT copyright 2022 American [employer] Association. All rights reserved. The codes documented in this report are preliminary and upon coder review may  be revised to meet current compliance requirements. Gerrit Friends. Olive Zmuda, MD Gennette Pac, MD 04/13/2023 9:40:07 AM This report has been signed electronically. Number of Addenda: 0

## 2023-04-13 NOTE — Transfer of Care (Signed)
Immediate Anesthesia Transfer of Care Note  Patient: [name]  Procedure(s) Performed: COLONOSCOPY WITH PROPOFOL POLYPECTOMY  Patient Location: Short Stay  Anesthesia Type:General  Level of Consciousness: awake, alert , and oriented  Airway & Oxygen Therapy: Patient Spontanous Breathing  Post-op Assessment: Report given to RN, Post -op Vital signs reviewed and stable, Patient moving all extremities X 4, and Patient able to stick tongue midline  Post vital signs: Reviewed and stable  Last Vitals:  Vitals Value Taken Time  BP 123/71   Temp 97.7   Pulse 60   Resp 27   SpO2 97     Last Pain:  Vitals:   04/13/23 0911  TempSrc:   PainSc: 0-No pain         Complications: No notable events documented.

## 2023-04-13 NOTE — H&P (Signed)
@LOGO @   Primary Care Physician:  Assunta Found, MD Primary Gastroenterologist:  Dr. Jena Gauss  Pre-Procedure History & Physical: HPI:  [name] is a 72 y.o. female is here for a screening colonoscopy.  Negative colonoscopy 10 years ago.  No GI symptoms.  On Xarelto-held x 2 days.  Past Medical History:  Diagnosis Date   Arthritis    Atrial tachycardia (HCC)    Atypical chest pain    Bundle branch block left    Dr. Alanda Amass is cardiologist   Elevated cholesterol    HSV-1 infection    Hypertension    Osteopenia 01/2019   T score -1.8   Pacemaker    Paroxysmal atrial fibrillation (HCC)    rrare episodes on pacemaker interrogation   Wears glasses     Past Surgical History:  Procedure Laterality Date   CARDIAC CATHETERIZATION  2005   Ejection fraction is estimated at  greater than or equal to 60 %  Wall motion is normal.   CAROTID DUPLEX BILATERAL  march 08,2005   Impression : No evidence of of signficant plaque formation or stenosis.   CARPAL TUNNEL RELEASE  04/30/2011   Procedure: CARPAL TUNNEL RELEASE;  Surgeon: Wyn Forster., MD;  Location: [facility];  Service: Orthopedics;  Laterality: Left;  left carpal tunnel release, inject left thumb   CARPAL TUNNEL RELEASE  04/28/2012   Procedure: CARPAL TUNNEL RELEASE;  Surgeon: Wyn Forster., MD;  Location: [facility];  Service: Orthopedics;  Laterality: Right;   CATARACT EXTRACTION W/PHACO Right 03/01/2022   Procedure: CATARACT EXTRACTION PHACO AND INTRAOCULAR LENS PLACEMENT (IOC);  Surgeon: Fabio Pierce, MD;  Location: [facility];  Service: Ophthalmology;  Laterality: Right;  CDE: 3.75   CATARACT EXTRACTION W/PHACO Left 03/26/2022   Procedure: CATARACT EXTRACTION PHACO AND INTRAOCULAR LENS PLACEMENT (IOC);  Surgeon: Fabio Pierce, MD;  Location: [facility];  Service: Ophthalmology;  Laterality: Left;  CDE 8.45   COLONOSCOPY     COLONOSCOPY  04/14/2012   Procedure: COLONOSCOPY;  Surgeon: Corbin Ade, MD;  Location: [facility];  Service: Endoscopy;  Laterality: N/A;  8:30 AM   DORSAL COMPARTMENT RELEASE  04/30/2011   Procedure: RELEASE DORSAL COMPARTMENT (DEQUERVAIN);  Surgeon: Wyn Forster., MD;  Location: [facility];  Service: Orthopedics;  Laterality: Left;  release 1st dorsal compartment on left   INSERT / REPLACE / REMOVE PACEMAKER  07/29/2009   PPM-Dual Chamber Systems Fluoro Guidance Venogram performed 10 ml 's to the left subclavian vein   PACEMAKER INSERTION  07/2009   Dr. Alanda Amass  Pacemaker is a Adapta L implanted 07/2009   PPM GENERATOR CHANGEOUT N/A 10/07/2022   Procedure: PPM GENERATOR CHANGEOUT - DUAL CHAMBER;  Surgeon: Thurmon Fair, MD;  Location: [facility];  Service: Cardiovascular;  Laterality: N/A;   RENAL CIRCULATION  07/ 08/ 08   normal renal duplex doppler    TRANSTHORACIC ECHOCARDIOGRAM  10/18/2012   Atrial tracking & V paced rhythm with pacer induced LBBB. No significant intra -ventricular  dyssynchrony.   TUBAL LIGATION  1986    Prior to Admission medications   Medication Sig Start Date End Date Taking? Authorizing Provider  acetaminophen (TYLENOL) 650 MG CR tablet Take 650 mg by mouth every 8 (eight) hours as needed for pain.   Yes [provider]  aspirin-acetaminophen-caffeine (HEADACHE RELIEF) 250-250-65 MG tablet Take 1 tablet by mouth every 8 (eight) hours as needed for headache.   Yes  [provider]  Calcium Carb-Cholecalciferol (CALCIUM-VITAMIN D) 600-400 MG-UNIT TABS Take 1 tablet by mouth 2 (two) times daily.   Yes [provider]  cholecalciferol (VITAMIN D3) 25 MCG (1000 UNIT) tablet Take 1,000 Units by mouth daily.   Yes [provider]  diclofenac Sodium (VOLTAREN) 1 % GEL Apply 1 Application topically 3 (three) times daily as needed (joint pain).   Yes [provider]  lisinopril (ZESTRIL) 20 MG tablet Take 20 mg by mouth daily.   Yes [provider]   lisinopril-hydrochlorothiazide (ZESTORETIC) 20-12.5 MG tablet Take 1 tablet by mouth daily.   Yes [provider]  Menthol, Topical Analgesic, (BIOFREEZE) 4 % GEL Apply 1 application  topically as needed.   Yes [provider]  metoprolol succinate (TOPROL-XL) 50 MG 24 hr tablet Take 1 tablet (50 mg total) by mouth daily. 08/10/22  Yes Croitoru, Mihai, MD  Multiple Vitamins-Minerals (WOMENS 50+ MULTI VITAMIN/MIN PO) Take 1 tablet by mouth daily.   Yes [provider]  Omega-3 Fatty Acids (FISH OIL) 500 MG CAPS Take 500 mg by mouth daily. Taken at [employer] and in the Afternoon (3p)   Yes [provider]  phenylephrine (EQ NASAL FOUR) 1 % nasal spray Place 1 drop into both nostrils every 6 (six) hours as needed for congestion.   Yes [provider]  rivaroxaban (XARELTO) 20 MG TABS tablet TAKE 1 TABLET BY MOUTH ONCE DAILY WITH SUPPER 10/13/22  Yes Croitoru, Mihai, MD  rivaroxaban (XARELTO) 20 MG TABS tablet Take 1 tablet (20 mg total) by mouth daily with supper. 01/13/23  Yes Croitoru, Mihai, MD  valACYclovir (VALTREX) 1000 MG tablet Take 1,000 mg by mouth 2 (two) times daily as needed. 11/17/22  Yes [provider]  celecoxib (CELEBREX) 200 MG capsule Take 200 mg by mouth daily. Patient not taking: Reported on 01/13/2023    [provider]  oxyCODONE-acetaminophen (PERCOCET) 5-325 MG tablet Take 1 tablet by mouth every 4 (four) hours as needed for severe pain. Patient not taking: Reported on 01/13/2023 07/07/22 07/07/23  Elson Areas, PA-C  rosuvastatin (CRESTOR) 10 MG tablet Take 1 tablet (10 mg total) by mouth daily. Patient taking differently: Take 10 mg by mouth at bedtime. 08/02/22   Croitoru, Rachelle Hora, MD    Allergies as of 03/04/2023 - Review Complete 01/13/2023  Allergen Reaction Noted   Vicodin [hydrocodone-acetaminophen] Nausea Only 02/23/2012   Lipitor [atorvastatin] Other (See Comments) 07/01/2020    Family History  Problem  Relation Age of Onset   Heart disease Mother    Diabetes Father    Colon cancer Maternal Aunt    Heart disease Paternal Grandfather     Social History   Socioeconomic History   Marital status: Married    Spouse name: Not on file   Number of children: Not on file   Years of education: Not on file   Highest education level: Not on file  Occupational History   Not on file  Tobacco Use   Smoking status: Former    Current packs/day: 1.50    Average packs/day: 1.5 packs/day for 30.0 years (45.0 ttl pk-yrs)    Types: Cigarettes   Smokeless tobacco: Never  Vaping Use   Vaping status: Never Used  Substance and Sexual Activity   Alcohol use: Yes    Alcohol/week: 7.0 standard drinks of alcohol    Types: 7 Glasses of wine per week   Drug use: No   Sexual activity: Not Currently    Birth  control/protection: Post-menopausal, Surgical    Comment: Tubal lig-1st intercourse 72 yo-Fewer than 5 partners  Other Topics Concern   Not on file  Social History Narrative   Not on file   Social Determinants of Health   Financial Resource Strain: Not on file  Food Insecurity: Not on file  Transportation Needs: Not on file  Physical Activity: Not on file  Stress: Not on file  Social Connections: Not on file  Intimate Partner Violence: Not on file    Review of Systems: See HPI, otherwise negative ROS  Physical Exam: BP (!) 180/93   Pulse 69   Temp 98 F (36.7 C) (Oral)   Resp (!) 25   Ht 5' (1.524 m)   Wt 62.6 kg   SpO2 98%   BMI 26.95 kg/m  General:   Alert,  Well-developed, well-nourished, pleasant and cooperative in NAD Lungs:  Clear throughout to auscultation.   No wheezes, crackles, or rhonchi. No acute distress. Heart:  Regular rate and rhythm; no murmurs, clicks, rubs,  or gallops. Abdomen:  Soft, nontender and nondistended. No masses, hepatosplenomegaly or hernias noted. Normal bowel sounds, without guarding, and without rebound.   Impression/Plan: [name] is now  here to undergo a screening colonoscopy.  Average risk screening colonoscopy  Risks, benefits, limitations, imponderables and alternatives regarding colonoscopy have been reviewed with the patient. Questions have been answered. All parties agreeable.  Last dose of Xarelto was on the 21st.    Notice:  This dictation was prepared with Dragon dictation along with smaller phrase technology. Any transcriptional errors that result from this process are unintentional and may not be corrected upon review.

## 2023-04-13 NOTE — Anesthesia Postprocedure Evaluation (Signed)
Anesthesia Post Note  Patient: [name]  Procedure(s) Performed: COLONOSCOPY WITH PROPOFOL POLYPECTOMY  Patient location during evaluation: PACU Anesthesia Type: General Level of consciousness: awake and alert Pain management: pain level controlled Vital Signs Assessment: post-procedure vital signs reviewed and stable Respiratory status: spontaneous breathing, nonlabored ventilation, respiratory function stable and patient connected to nasal cannula oxygen Cardiovascular status: blood pressure returned to baseline and stable Postop Assessment: no apparent nausea or vomiting Anesthetic complications: no   There were no known notable events for this encounter.   Last Vitals:  Vitals:   04/13/23 0717 04/13/23 0934  BP: (!) 180/93 123/71  Pulse: 69 61  Resp: (!) 25 16  Temp: 36.7 C 36.5 C  SpO2: 98% 96%    Last Pain:  Vitals:   04/13/23 0934  TempSrc: Oral  PainSc: 0-No pain                 Vontrell Pullman L Altheia Shafran

## 2023-04-13 NOTE — Discharge Instructions (Signed)
  Colonoscopy Discharge Instructions  Read the instructions outlined below and refer to this sheet in the next few weeks. These discharge instructions provide you with general information on caring for yourself after you leave the hospital. Your doctor may also give you specific instructions. While your treatment has been planned according to the most current medical practices available, unavoidable complications occasionally occur. If you have any problems or questions after discharge, call Dr. Jena Gauss at [phone]. ACTIVITY You may resume your regular activity, but move at a slower pace for the next 24 hours.  Take frequent rest periods for the next 24 hours.  Walking will help get rid of the air and reduce the bloated feeling in your belly (abdomen).  No driving for 24 hours (because of the medicine (anesthesia) used during the test).   Do not sign any important legal documents or operate any machinery for 24 hours (because of the anesthesia used during the test).  NUTRITION Drink plenty of fluids.  You may resume your normal diet as instructed by your doctor.  Begin with a light meal and progress to your normal diet. Heavy or fried foods are harder to digest and may make you feel sick to your stomach (nauseated).  Avoid alcoholic beverages for 24 hours or as instructed.  MEDICATIONS You may resume your normal medications unless your doctor tells you otherwise.  WHAT YOU CAN EXPECT TODAY Some feelings of bloating in the abdomen.  Passage of more gas than usual.  Spotting of blood in your stool or on the toilet paper.  IF YOU HAD POLYPS REMOVED DURING THE COLONOSCOPY: No aspirin products for 7 days or as instructed.  No alcohol for 7 days or as instructed.  Eat a soft diet for the next 24 hours.  FINDING OUT THE RESULTS OF YOUR TEST Not all test results are available during your visit. If your test results are not back during the visit, make an appointment with your caregiver to find out the  results. Do not assume everything is normal if you have not heard from your caregiver or the medical facility. It is important for you to follow up on all of your test results.  SEEK IMMEDIATE MEDICAL ATTENTION IF: You have more than a spotting of blood in your stool.  Your belly is swollen (abdominal distention).  You are nauseated or vomiting.  You have a temperature over 101.  You have abdominal pain or discomfort that is severe or gets worse throughout the day.    1 small polyp removed from your colon  Further recommendations to follow pending review of pathology report  Resume Xarelto tomorrow.  At patient request, I called Alden Server at [phone]

## 2023-04-14 LAB — SURGICAL PATHOLOGY

## 2023-04-17 ENCOUNTER — Encounter: Payer: Self-pay | Admitting: Internal Medicine

## 2023-04-20 ENCOUNTER — Encounter (HOSPITAL_COMMUNITY): Payer: Self-pay | Admitting: Internal Medicine

## 2023-04-21 ENCOUNTER — Telehealth: Payer: Self-pay

## 2023-04-21 DIAGNOSIS — M8589 Other specified disorders of bone density and structure, multiple sites: Secondary | ICD-10-CM

## 2023-04-21 NOTE — Telephone Encounter (Signed)
Former ML pt, UTD on AEX. LVM in triage line stating that she would rather go to another imaging location for her DEXA than [facility]. Preferably one closer to [place].   Ok to send to [facility]?

## 2023-04-21 NOTE — Telephone Encounter (Signed)
Yes, thank you.

## 2023-04-25 ENCOUNTER — Encounter: Payer: Self-pay | Admitting: Cardiovascular Disease

## 2023-04-26 NOTE — Telephone Encounter (Signed)
Order modified for pt to go to [facility].

## 2023-04-29 NOTE — Progress Notes (Signed)
Remote pacemaker transmission.   

## 2023-04-30 ENCOUNTER — Other Ambulatory Visit: Payer: Self-pay | Admitting: Cardiovascular Disease

## 2023-05-02 ENCOUNTER — Encounter (HOSPITAL_COMMUNITY): Payer: Self-pay

## 2023-05-02 ENCOUNTER — Ambulatory Visit (HOSPITAL_COMMUNITY)
Admission: RE | Admit: 2023-05-02 | Discharge: 2023-05-02 | Disposition: A | Discharge status: Home / Self Care | Payer: Medicare HMO | Source: Ambulatory Visit | Attending: Radiology | Admitting: Radiology

## 2023-05-02 ENCOUNTER — Ambulatory Visit (HOSPITAL_COMMUNITY)
Admission: RE | Admit: 2023-05-02 | Discharge: 2023-05-02 | Disposition: A | Discharge status: Home / Self Care | Payer: Medicare HMO | Source: Ambulatory Visit | Attending: Family Medicine | Admitting: Family Medicine

## 2023-05-02 DIAGNOSIS — M8589 Other specified disorders of bone density and structure, multiple sites: Secondary | ICD-10-CM | POA: Diagnosis not present

## 2023-05-02 DIAGNOSIS — Z1231 Encounter for screening mammogram for malignant neoplasm of breast: Secondary | ICD-10-CM | POA: Diagnosis not present

## 2023-05-02 NOTE — Telephone Encounter (Signed)
Pt scheduled to received DEXA on 05/02/2023. Routing to provider for final review and closing encounter.

## 2023-05-05 ENCOUNTER — Other Ambulatory Visit: Payer: Self-pay | Admitting: Cardiovascular Disease

## 2023-06-06 ENCOUNTER — Other Ambulatory Visit: Payer: Self-pay | Admitting: Cardiovascular Disease

## 2023-06-06 DIAGNOSIS — I480 Paroxysmal atrial fibrillation: Secondary | ICD-10-CM

## 2023-06-06 NOTE — Telephone Encounter (Signed)
Prescription refill request for Xarelto received.  Indication: Afib  Last office visit: 01/13/23 (Croitoru)  Weight: 62.6kg Age: 72 Scr: 0.89 (04/11/23)  CrCl: 55.15ml/min  Appropriate dose. Refill sent.

## 2023-07-06 DIAGNOSIS — G243 Spasmodic torticollis: Secondary | ICD-10-CM | POA: Diagnosis not present

## 2023-07-06 DIAGNOSIS — I480 Paroxysmal atrial fibrillation: Secondary | ICD-10-CM | POA: Diagnosis not present

## 2023-07-06 DIAGNOSIS — I10 Essential (primary) hypertension: Secondary | ICD-10-CM | POA: Diagnosis not present

## 2023-07-06 DIAGNOSIS — E663 Overweight: Secondary | ICD-10-CM | POA: Diagnosis not present

## 2023-07-06 DIAGNOSIS — Z6826 Body mass index (BMI) 26.0-26.9, adult: Secondary | ICD-10-CM | POA: Diagnosis not present

## 2023-07-06 DIAGNOSIS — M26629 Arthralgia of temporomandibular joint, unspecified side: Secondary | ICD-10-CM | POA: Diagnosis not present

## 2023-07-12 ENCOUNTER — Ambulatory Visit (INDEPENDENT_AMBULATORY_CARE_PROVIDER_SITE_OTHER): Payer: Medicare HMO

## 2023-07-12 DIAGNOSIS — I442 Atrioventricular block, complete: Secondary | ICD-10-CM

## 2023-07-12 LAB — CUP PACEART REMOTE DEVICE CHECK
Battery Remaining Longevity: 131 mo
Battery Voltage: 3.06 V
Brady Statistic AP VP Percent: 9.67 %
Brady Statistic AP VS Percent: 0 %
Brady Statistic AS VP Percent: 89.85 %
Brady Statistic AS VS Percent: 0.05 %
Brady Statistic RA Percent Paced: 0.61 %
Brady Statistic RV Percent Paced: 99.86 %
Date Time Interrogation Session: 20250120213738
Implantable Lead Connection Status: 753985
Implantable Lead Connection Status: 753985
Implantable Lead Implant Date: 20110208
Implantable Lead Implant Date: 20110208
Implantable Lead Location: 753859
Implantable Lead Location: 753860
Implantable Lead Model: 5076
Implantable Lead Model: 5076
Implantable Pulse Generator Implant Date: 20240418
Lead Channel Impedance Value: 323 Ohm
Lead Channel Impedance Value: 342 Ohm
Lead Channel Impedance Value: 380 Ohm
Lead Channel Impedance Value: 437 Ohm
Lead Channel Pacing Threshold Amplitude: 0.5 V
Lead Channel Pacing Threshold Amplitude: 1.125 V
Lead Channel Pacing Threshold Pulse Width: 0.4 ms
Lead Channel Pacing Threshold Pulse Width: 0.4 ms
Lead Channel Sensing Intrinsic Amplitude: 1.5 mV
Lead Channel Sensing Intrinsic Amplitude: 1.5 mV
Lead Channel Setting Pacing Amplitude: 1.5 V
Lead Channel Setting Pacing Amplitude: 2.25 V
Lead Channel Setting Pacing Pulse Width: 0.4 ms
Lead Channel Setting Sensing Sensitivity: 1.2 mV
Zone Setting Status: 755011

## 2023-07-16 ENCOUNTER — Encounter: Payer: Self-pay | Admitting: Cardiovascular Disease

## 2023-07-22 DIAGNOSIS — Z0001 Encounter for general adult medical examination with abnormal findings: Secondary | ICD-10-CM | POA: Diagnosis not present

## 2023-07-22 DIAGNOSIS — Z1331 Encounter for screening for depression: Secondary | ICD-10-CM | POA: Diagnosis not present

## 2023-07-22 DIAGNOSIS — Z950 Presence of cardiac pacemaker: Secondary | ICD-10-CM | POA: Diagnosis not present

## 2023-07-22 DIAGNOSIS — I480 Paroxysmal atrial fibrillation: Secondary | ICD-10-CM | POA: Diagnosis not present

## 2023-07-22 DIAGNOSIS — I10 Essential (primary) hypertension: Secondary | ICD-10-CM | POA: Diagnosis not present

## 2023-07-22 DIAGNOSIS — R7309 Other abnormal glucose: Secondary | ICD-10-CM | POA: Diagnosis not present

## 2023-07-22 DIAGNOSIS — E7849 Other hyperlipidemia: Secondary | ICD-10-CM | POA: Diagnosis not present

## 2023-07-22 DIAGNOSIS — Z6825 Body mass index (BMI) 25.0-25.9, adult: Secondary | ICD-10-CM | POA: Diagnosis not present

## 2023-07-22 DIAGNOSIS — E663 Overweight: Secondary | ICD-10-CM | POA: Diagnosis not present

## 2023-07-22 DIAGNOSIS — E782 Mixed hyperlipidemia: Secondary | ICD-10-CM | POA: Diagnosis not present

## 2023-08-02 DIAGNOSIS — E663 Overweight: Secondary | ICD-10-CM | POA: Diagnosis not present

## 2023-08-02 DIAGNOSIS — M25571 Pain in right ankle and joints of right foot: Secondary | ICD-10-CM | POA: Diagnosis not present

## 2023-08-02 DIAGNOSIS — Z6826 Body mass index (BMI) 26.0-26.9, adult: Secondary | ICD-10-CM | POA: Diagnosis not present

## 2023-08-11 ENCOUNTER — Encounter: Payer: Self-pay | Admitting: Cardiovascular Disease

## 2023-08-15 MED ORDER — METOPROLOL SUCCINATE ER 100 MG PO TB24: 100.0000 mg | ORAL_TABLET | Freq: Every day | ORAL | 3 refills | Status: AC

## 2023-08-15 NOTE — Telephone Encounter (Signed)
 Please send in a new prescription for metoprolol succinate 100 mg once daily, with refills.  I told her she can take 50 mg (half a tablet) in the morning and 50 mg at night.  Also recommended that she take amlodipine 5 mg once daily (she already has this medication).  Thank you

## 2023-08-16 DIAGNOSIS — D225 Melanocytic nevi of trunk: Secondary | ICD-10-CM | POA: Diagnosis not present

## 2023-08-16 DIAGNOSIS — L820 Inflamed seborrheic keratosis: Secondary | ICD-10-CM | POA: Diagnosis not present

## 2023-08-16 DIAGNOSIS — L821 Other seborrheic keratosis: Secondary | ICD-10-CM | POA: Diagnosis not present

## 2023-08-16 DIAGNOSIS — L814 Other melanin hyperpigmentation: Secondary | ICD-10-CM | POA: Diagnosis not present

## 2023-08-16 DIAGNOSIS — D2262 Melanocytic nevi of left upper limb, including shoulder: Secondary | ICD-10-CM | POA: Diagnosis not present

## 2023-08-22 NOTE — Progress Notes (Signed)
 Remote pacemaker transmission.

## 2023-08-28 NOTE — Telephone Encounter (Signed)
 Can we please make sure amlodipine 5 mg once daily is added to her list?

## 2023-08-29 MED ORDER — AMLODIPINE BESYLATE 5 MG PO TABS: 5.0000 mg | ORAL_TABLET | Freq: Every day | ORAL | Status: DC

## 2023-08-29 NOTE — Addendum Note (Signed)
 Addended by: Benay Spice on: 08/29/2023 09:01 AM   Modules accepted: Orders

## 2023-09-05 DIAGNOSIS — D582 Other hemoglobinopathies: Secondary | ICD-10-CM | POA: Diagnosis not present

## 2023-09-16 DIAGNOSIS — E538 Deficiency of other specified B group vitamins: Secondary | ICD-10-CM | POA: Diagnosis not present

## 2023-09-23 ENCOUNTER — Telehealth: Payer: Self-pay

## 2023-09-23 NOTE — Telephone Encounter (Signed)
 Yes, thank you.

## 2023-09-23 NOTE — Telephone Encounter (Signed)
 Spoke w/ the pt and advised her. Stated she plans to continue to receive her valtrex from her derm or PCP and will scheduled B&P in 2026. Recall updated. Routing to provider for final review. Encounter closed.

## 2023-09-23 NOTE — Telephone Encounter (Signed)
 Former ML pt LVM in triage line stating that she feels as if there was some sort of mis-communication when it comes to her risk MCR questions. She was asked if any hx of STIs in the past and reported oral HSV/fever blisters. However, has not been exposed to any STI/STDs in the past and wants clarification on this when it comes to her needing B&Ps q year versus q other year.   Please advise.

## 2023-10-10 DIAGNOSIS — M25561 Pain in right knee: Secondary | ICD-10-CM | POA: Diagnosis not present

## 2023-10-10 DIAGNOSIS — M79671 Pain in right foot: Secondary | ICD-10-CM | POA: Insufficient documentation

## 2023-10-10 DIAGNOSIS — M11261 Other chondrocalcinosis, right knee: Secondary | ICD-10-CM | POA: Diagnosis not present

## 2023-10-11 ENCOUNTER — Ambulatory Visit (INDEPENDENT_AMBULATORY_CARE_PROVIDER_SITE_OTHER): Payer: Medicare HMO

## 2023-10-11 DIAGNOSIS — I442 Atrioventricular block, complete: Secondary | ICD-10-CM | POA: Diagnosis not present

## 2023-10-12 LAB — CUP PACEART REMOTE DEVICE CHECK
Battery Remaining Longevity: 139 mo
Battery Voltage: 3.03 V
Brady Statistic AP VP Percent: 5.73 %
Brady Statistic AP VS Percent: 0 %
Brady Statistic AS VP Percent: 93.97 %
Brady Statistic AS VS Percent: 0.04 %
Brady Statistic RA Percent Paced: 2.96 %
Brady Statistic RV Percent Paced: 99.86 %
Date Time Interrogation Session: 20250421232443
Implantable Lead Connection Status: 753985
Implantable Lead Connection Status: 753985
Implantable Lead Implant Date: 20110208
Implantable Lead Implant Date: 20110208
Implantable Lead Location: 753859
Implantable Lead Location: 753860
Implantable Lead Model: 5076
Implantable Lead Model: 5076
Implantable Pulse Generator Implant Date: 20240418
Lead Channel Impedance Value: 342 Ohm
Lead Channel Impedance Value: 361 Ohm
Lead Channel Impedance Value: 399 Ohm
Lead Channel Impedance Value: 437 Ohm
Lead Channel Pacing Threshold Amplitude: 0.5 V
Lead Channel Pacing Threshold Amplitude: 0.875 V
Lead Channel Pacing Threshold Pulse Width: 0.4 ms
Lead Channel Pacing Threshold Pulse Width: 0.4 ms
Lead Channel Sensing Intrinsic Amplitude: 2.125 mV
Lead Channel Sensing Intrinsic Amplitude: 2.125 mV
Lead Channel Setting Pacing Amplitude: 1.5 V
Lead Channel Setting Pacing Amplitude: 2 V
Lead Channel Setting Pacing Pulse Width: 0.4 ms
Lead Channel Setting Sensing Sensitivity: 1.2 mV
Zone Setting Status: 755011

## 2023-10-22 ENCOUNTER — Encounter: Payer: Self-pay | Admitting: Cardiovascular Disease

## 2023-10-25 ENCOUNTER — Other Ambulatory Visit: Payer: Self-pay

## 2023-10-25 MED ORDER — ROSUVASTATIN CALCIUM 10 MG PO TABS: 10.0000 mg | ORAL_TABLET | Freq: Every day | ORAL | 0 refills | Status: DC

## 2023-10-26 DIAGNOSIS — H04123 Dry eye syndrome of bilateral lacrimal glands: Secondary | ICD-10-CM | POA: Diagnosis not present

## 2023-10-26 NOTE — Telephone Encounter (Signed)
 It is okay.  She has already figured out how she wants to do it.

## 2023-10-28 ENCOUNTER — Encounter: Payer: Self-pay | Admitting: Internal Medicine

## 2023-10-28 ENCOUNTER — Ambulatory Visit: Payer: Medicare HMO | Admitting: Internal Medicine

## 2023-10-28 VITALS — BP 104/66 | HR 71 | Ht 60.0 in | Wt 135.0 lb

## 2023-10-28 DIAGNOSIS — Z8639 Personal history of other endocrine, nutritional and metabolic disease: Secondary | ICD-10-CM | POA: Diagnosis not present

## 2023-10-28 LAB — T4, FREE: Free T4: 1.3 ng/dL (ref 0.8–1.8)

## 2023-10-28 LAB — TSH: TSH: 2.43 m[IU]/L (ref 0.40–4.50)

## 2023-10-28 LAB — T3, FREE: T3, Free: 3.4 pg/mL (ref 2.3–4.2)

## 2023-10-28 NOTE — Progress Notes (Unsigned)
 Name: [name]  MRN/ DOB: [id number], [birth year]    Age/ Sex: 73 y.o., female     PCP: Minus Amel, MD   Reason for Endocrinology Evaluation: Hyperthyroidism     Initial Endocrinology Clinic Visit: 04/14/2016    PATIENT IDENTIFIER: [name] is a 73 y.o., female with a past medical history of HTN, PAF and hyperhtyoidism. She has followed with [facility] since 04/14/2016 for consultative assistance with management of her Hyperthyroidism.   HISTORICAL SUMMARY: The patient was first diagnosed with Hyperthyroidism in 2017.   She had declined RAI through her previous endocrinoloogist   She has been on Methimazole  for years until April, 2023 when she stopped it.    Repeat TFT's in 11/2021 showed normalization and we opted to remain off methimazole     No Amiodarone intake     No FH of thyroid  disease  SUBJECTIVE:    Today (10/28/2023):  Ms. [name] is here for hyperthyroidism.   She continues to follow-up with cardiology for history of complete heart block, pacemaker, paroxysmal A-fib  Weight continues to be stable without major changes Denies palpitations  Denies tremors  No local neck swelling  Has been having loose stools  Continues with dry eyes, up to date on eye exams      HISTORY:  Past Medical History:  Past Medical History:  Diagnosis Date   Arthritis    Atrial tachycardia (HCC)    Atypical chest pain    Bundle branch block left    Dr. Ed Gondola is cardiologist   Elevated cholesterol    HSV-1 infection    Hypertension    Osteopenia 01/2019   T score -1.8   Pacemaker    Paroxysmal atrial fibrillation (HCC)    rrare episodes on pacemaker interrogation   Wears glasses    Past Surgical History:  Past Surgical History:  Procedure Laterality Date   CARDIAC CATHETERIZATION  2005   Ejection fraction is estimated at  greater than or equal to 60 %  Wall motion is normal.   CAROTID DUPLEX BILATERAL  march 08,2005    Impression : No evidence of of signficant plaque formation or stenosis.   CARPAL TUNNEL RELEASE  04/30/2011   Procedure: CARPAL TUNNEL RELEASE;  Surgeon: Amelie Baize., MD;  Location: [facility];  Service: Orthopedics;  Laterality: Left;  left carpal tunnel release, inject left thumb   CARPAL TUNNEL RELEASE  04/28/2012   Procedure: CARPAL TUNNEL RELEASE;  Surgeon: Amelie Baize., MD;  Location: [facility];  Service: Orthopedics;  Laterality: Right;   CATARACT EXTRACTION W/PHACO Right 03/01/2022   Procedure: CATARACT EXTRACTION PHACO AND INTRAOCULAR LENS PLACEMENT (IOC);  Surgeon: Tarri Farm, MD;  Location: [facility];  Service: Ophthalmology;  Laterality: Right;  CDE: 3.75   CATARACT EXTRACTION W/PHACO Left 03/26/2022   Procedure: CATARACT EXTRACTION PHACO AND INTRAOCULAR LENS PLACEMENT (IOC);  Surgeon: Tarri Farm, MD;  Location: [facility];  Service: Ophthalmology;  Laterality: Left;  CDE 8.45   COLONOSCOPY     COLONOSCOPY  04/14/2012   Procedure: COLONOSCOPY;  Surgeon: Suzette Espy, MD;  [facility]: [facility];  Service: Endoscopy;  Laterality: N/A;  8:30 AM   COLONOSCOPY WITH PROPOFOL  N/A 04/13/2023   Procedure: COLONOSCOPY WITH PROPOFOL ;  Surgeon: Suzette Espy, MD;  [facility];  Service: Endoscopy;  Laterality: N/A;  9:00am, asa 3   DORSAL COMPARTMENT RELEASE  04/30/2011   Procedure: RELEASE DORSAL COMPARTMENT (  DEQUERVAIN);  Surgeon: Amelie Baize., MD;  Location: [facility];  Service: Orthopedics;  Laterality: Left;  release 1st dorsal compartment on left   INSERT / REPLACE / REMOVE PACEMAKER  07/29/2009   PPM-Dual [employer] Systems Fluoro Guidance Venogram performed 10 ml 's to the left subclavian vein   PACEMAKER INSERTION  07/2009   Dr. Ed Gondola  Pacemaker is a Adapta L implanted 07/2009   POLYPECTOMY  04/13/2023   Procedure: POLYPECTOMY;  Surgeon: Suzette Espy, MD;  [facility]: [facility];  Service: Endoscopy;;    PPM GENERATOR CHANGEOUT N/A 10/07/2022   Procedure: PPM GENERATOR CHANGEOUT - DUAL CHAMBER;  Surgeon: Luana Rumple, MD;  [facility]: [facility];  Service: Cardiovascular;  Laterality: N/A;   RENAL CIRCULATION  07/ 08/ 08   normal renal duplex doppler    TRANSTHORACIC ECHOCARDIOGRAM  10/18/2012   Atrial tracking & V paced rhythm with pacer induced LBBB. No significant intra -ventricular  dyssynchrony.   TUBAL LIGATION  1986   Social History:  reports that she has quit smoking. Her smoking use included cigarettes. She has a 45 pack-year smoking history. She has never used smokeless tobacco. She reports current alcohol use of about 7.0 standard drinks of alcohol per week. She reports that she does not use drugs. Family History:  Family History  Problem Relation Age of Onset   Heart disease Mother    Diabetes Father    Colon cancer Maternal Aunt    Heart disease Paternal Grandfather      HOME MEDICATIONS: Allergies as of 10/28/2023       Reactions   Vicodin [hydrocodone-acetaminophen ] Nausea Only   Lipitor [atorvastatin ] Other (See Comments)   Muscle/joint pain        Medication List        Accurate as of Oct 28, 2023  9:48 AM. If you have any questions, ask your nurse or doctor.          Abrysvo 120 MCG/0.5ML injection Generic drug: RSV bivalent vaccine   acetaminophen  650 MG CR tablet Commonly known as: TYLENOL  Take 650 mg by mouth every 8 (eight) hours as needed for pain.   amLODipine  10 MG tablet Commonly known as: NORVASC  Take 5 mg by mouth daily.   amLODipine  5 MG tablet Commonly known as: NORVASC  Take 1 tablet (5 mg total) by mouth daily.   Biofreeze 4 % Gel Generic drug: Menthol (Topical Analgesic) Apply 1 application  topically as needed.   Calcium -Vitamin D  600-400 MG-UNIT Tabs Take 1 tablet by mouth 2 (two) times daily.   celecoxib 200 MG capsule Commonly known as: CELEBREX Take 200 mg by mouth daily.   cholecalciferol 25 MCG (1000  UNIT) tablet Commonly known as: VITAMIN D3 Take 1,000 Units by mouth daily.   diclofenac Sodium 1 % Gel Commonly known as: VOLTAREN Apply 1 Application topically 3 (three) times daily as needed (joint pain).   EQ NASAL FOUR 1 % nasal spray Generic drug: phenylephrine  Place 1 drop into both nostrils every 6 (six) hours as needed for congestion.   escitalopram 10 MG tablet Commonly known as: LEXAPRO Take 10 mg by mouth every morning.   Fish Oil 500 MG Caps Take 500 mg by mouth daily. Taken at [employer] and in the Afternoon (3p)   Headache Relief 250-250-65 MG tablet Generic drug: aspirin-acetaminophen -caffeine Take 1 tablet by mouth every 8 (eight) hours as needed for headache.   lisinopril 20 MG tablet Commonly known as: ZESTRIL Take 20 mg  by mouth daily.   lisinopril-hydrochlorothiazide 20-12.5 MG tablet Commonly known as: ZESTORETIC Take 1 tablet by mouth daily.   metoprolol  succinate 100 MG 24 hr tablet Commonly known as: TOPROL -XL Take 1 tablet (100 mg total) by mouth daily.   rosuvastatin  10 MG tablet Commonly known as: CRESTOR  Take 1 tablet (10 mg total) by mouth daily.   valACYclovir 1000 MG tablet Commonly known as: VALTREX Take 1,000 mg by mouth 2 (two) times daily as needed.   WOMENS 50+ MULTI VITAMIN/MIN PO Take 1 tablet by mouth daily.   Xarelto  20 MG Tabs tablet Generic drug: rivaroxaban  TAKE 1 TABLET BY MOUTH ONCE DAILY WITH SUPPER What changed: Another medication with the same name was removed. Continue taking this medication, and follow the directions you see here. Changed by: Camilla Cedar Lydiann Bonifas          OBJECTIVE:   PHYSICAL EXAM: VS: BP 104/66 (BP Location: Left Arm, Patient Position: Sitting, Cuff Size: Normal)   Pulse 71   Ht 5' (1.524 m)   Wt 135 lb (61.2 kg)   SpO2 98%   BMI 26.37 kg/m    EXAM: General: Pt appears well and is in NAD  Neck: General: Supple without adenopathy. Thyroid : Thyroid  size normal.  No goiter or nodules  appreciated.   Lungs: Clear with good BS bilat with no rales, rhonchi, or wheezes  Heart: Auscultation: RRR.  Abdomen:  soft, nontender  Extremities:  BL LE: No pretibial edema   Mental Status: Judgment, insight: Intact Orientation: Oriented to time, place, and person Mood and affect: No depression, anxiety, or agitation     DATA REVIEWED:    Latest Reference Range & Units 10/28/23 10:04  TSH 0.40 - 4.50 mIU/L 2.43  Triiodothyronine,Free,Serum 2.3 - 4.2 pg/mL 3.4  T4,Free(Direct) 0.8 - 1.8 ng/dL 1.3     ASSESSMENT / PLAN / RECOMMENDATIONS:   Hx of Hyperthyroidism   - Clinical scenario consistent with Graves' disease  - She has been off Methimazole  since ~ 2023 - Repeat TFT's remain within normal range    Recommend continuation of current Tx with primary MD and consultative f/u at [facility] in the future if needed    Signed electronically by: Natale Bail, MD  [facility]  [facility] [address] [place] [place], [place] [address] Phone: [phone] FAX: [phone]      CC: Minus Amel, MD [address] [place] [address] Phone: [phone]  Fax: [phone]   Return to Endocrinology clinic as below: Future Appointments  Date Time Provider Department Center  10/28/2023  9:50 AM Abrina Petz, Julian Obey, MD [facility] None  01/10/2024  7:05 AM [facility] DEVICE REMOTES [facility]  01/12/2024 10:40 AM Croitoru, Mihai, MD [facility]  04/10/2024  7:05 AM [facility] DEVICE REMOTES [facility]  07/10/2024  7:05 AM [facility] DEVICE REMOTES [facility]  10/09/2024  7:05 AM [facility] DEVICE REMOTES [facility]

## 2023-10-31 ENCOUNTER — Encounter: Payer: Self-pay | Admitting: Internal Medicine

## 2023-11-24 NOTE — Addendum Note (Signed)
 Addended by: Lott Rouleau A on: 11/24/2023 03:02 PM   Modules accepted: Orders

## 2023-11-24 NOTE — Progress Notes (Signed)
 Remote pacemaker transmission.

## 2023-12-05 ENCOUNTER — Other Ambulatory Visit: Payer: Self-pay | Admitting: Cardiovascular Disease

## 2023-12-05 DIAGNOSIS — I480 Paroxysmal atrial fibrillation: Secondary | ICD-10-CM

## 2023-12-05 NOTE — Telephone Encounter (Signed)
 Prescription refill request for Xarelto  received.  Indication:afib Last office visit:7/24 Weight:61.2  kg Age:73 Scr:0.89  10/24 CrCl:54.39  ml/min  Prescription refilled

## 2023-12-10 IMAGING — DX DG CHEST 2V
2 series · 2 of 2 positions shown · non-contrast
Comparison: March 13, 2016

CLINICAL DATA: Congestion and dry cough x3 weeks.

EXAM:
CHEST - 2 VIEW

[chest pa]
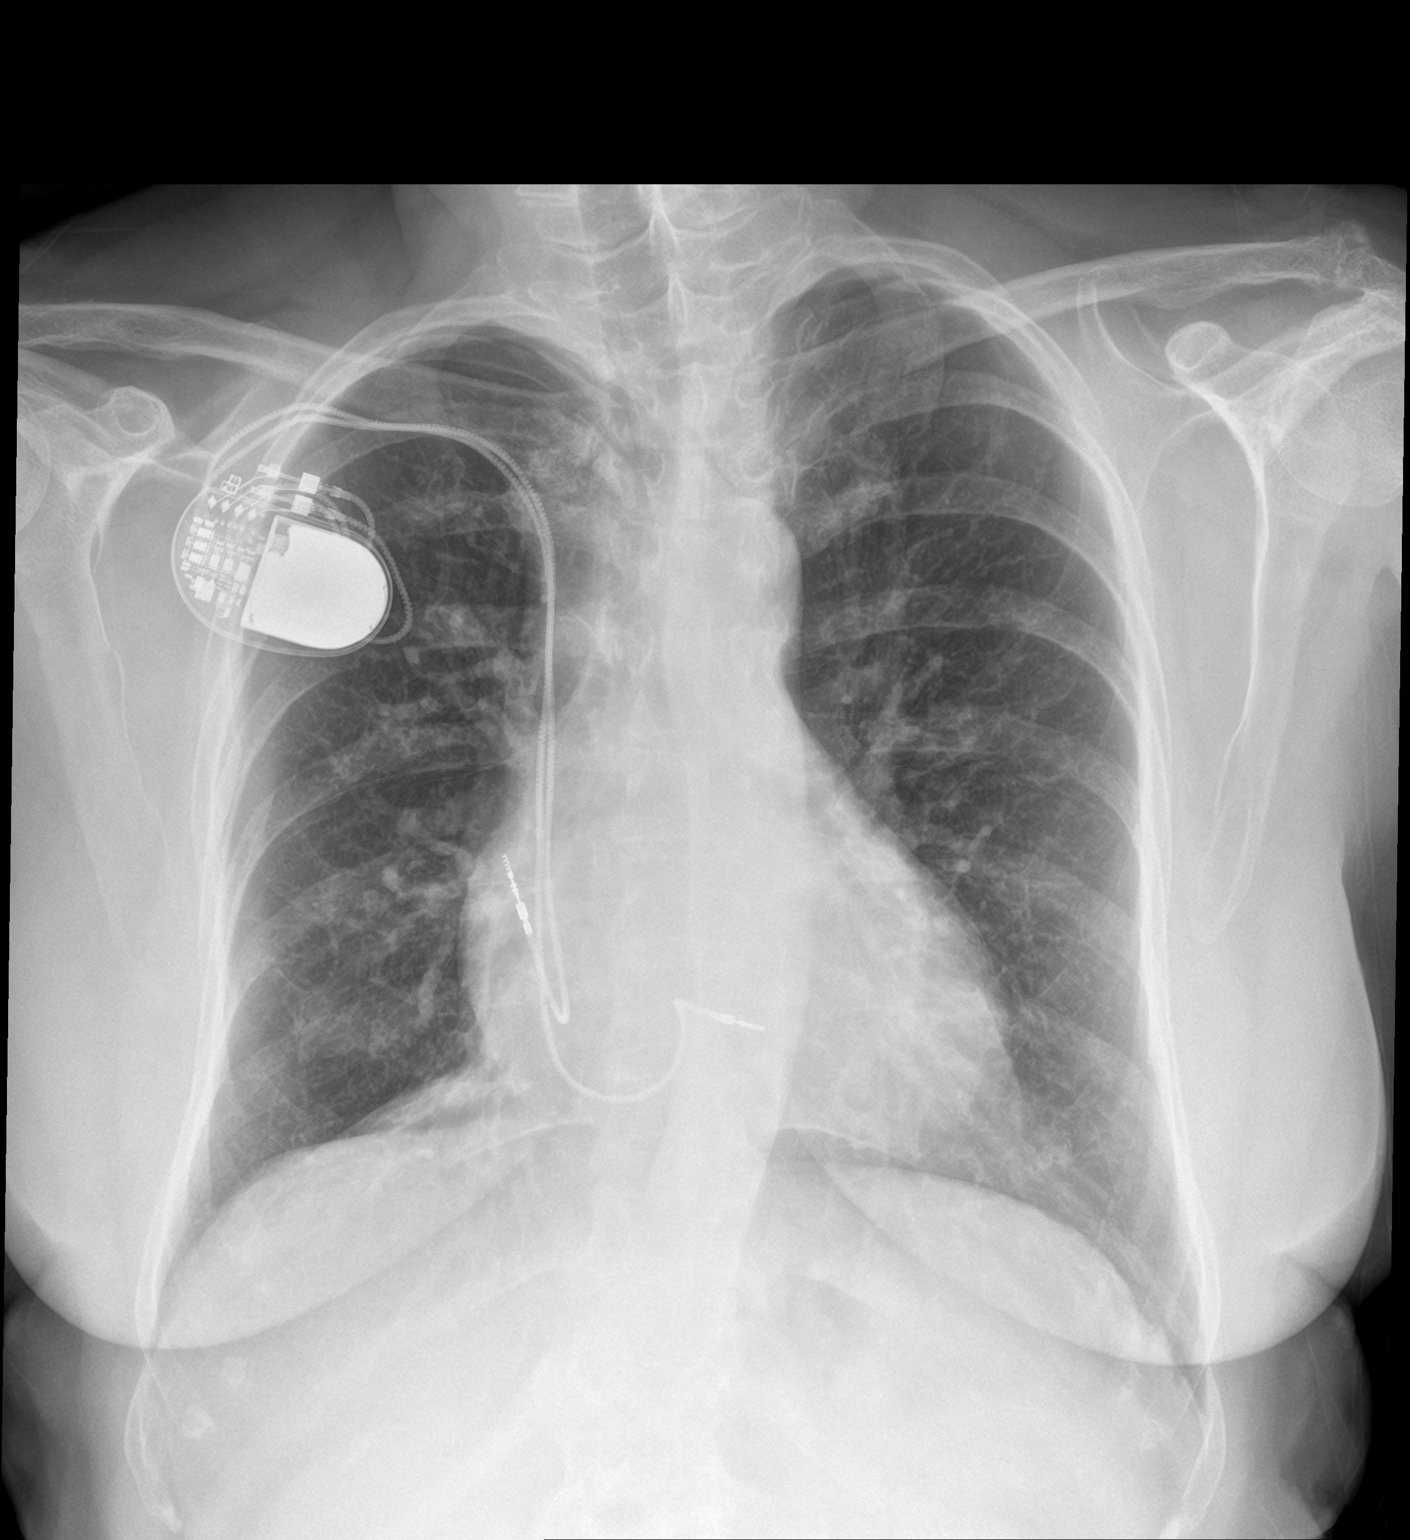

[chest lat]
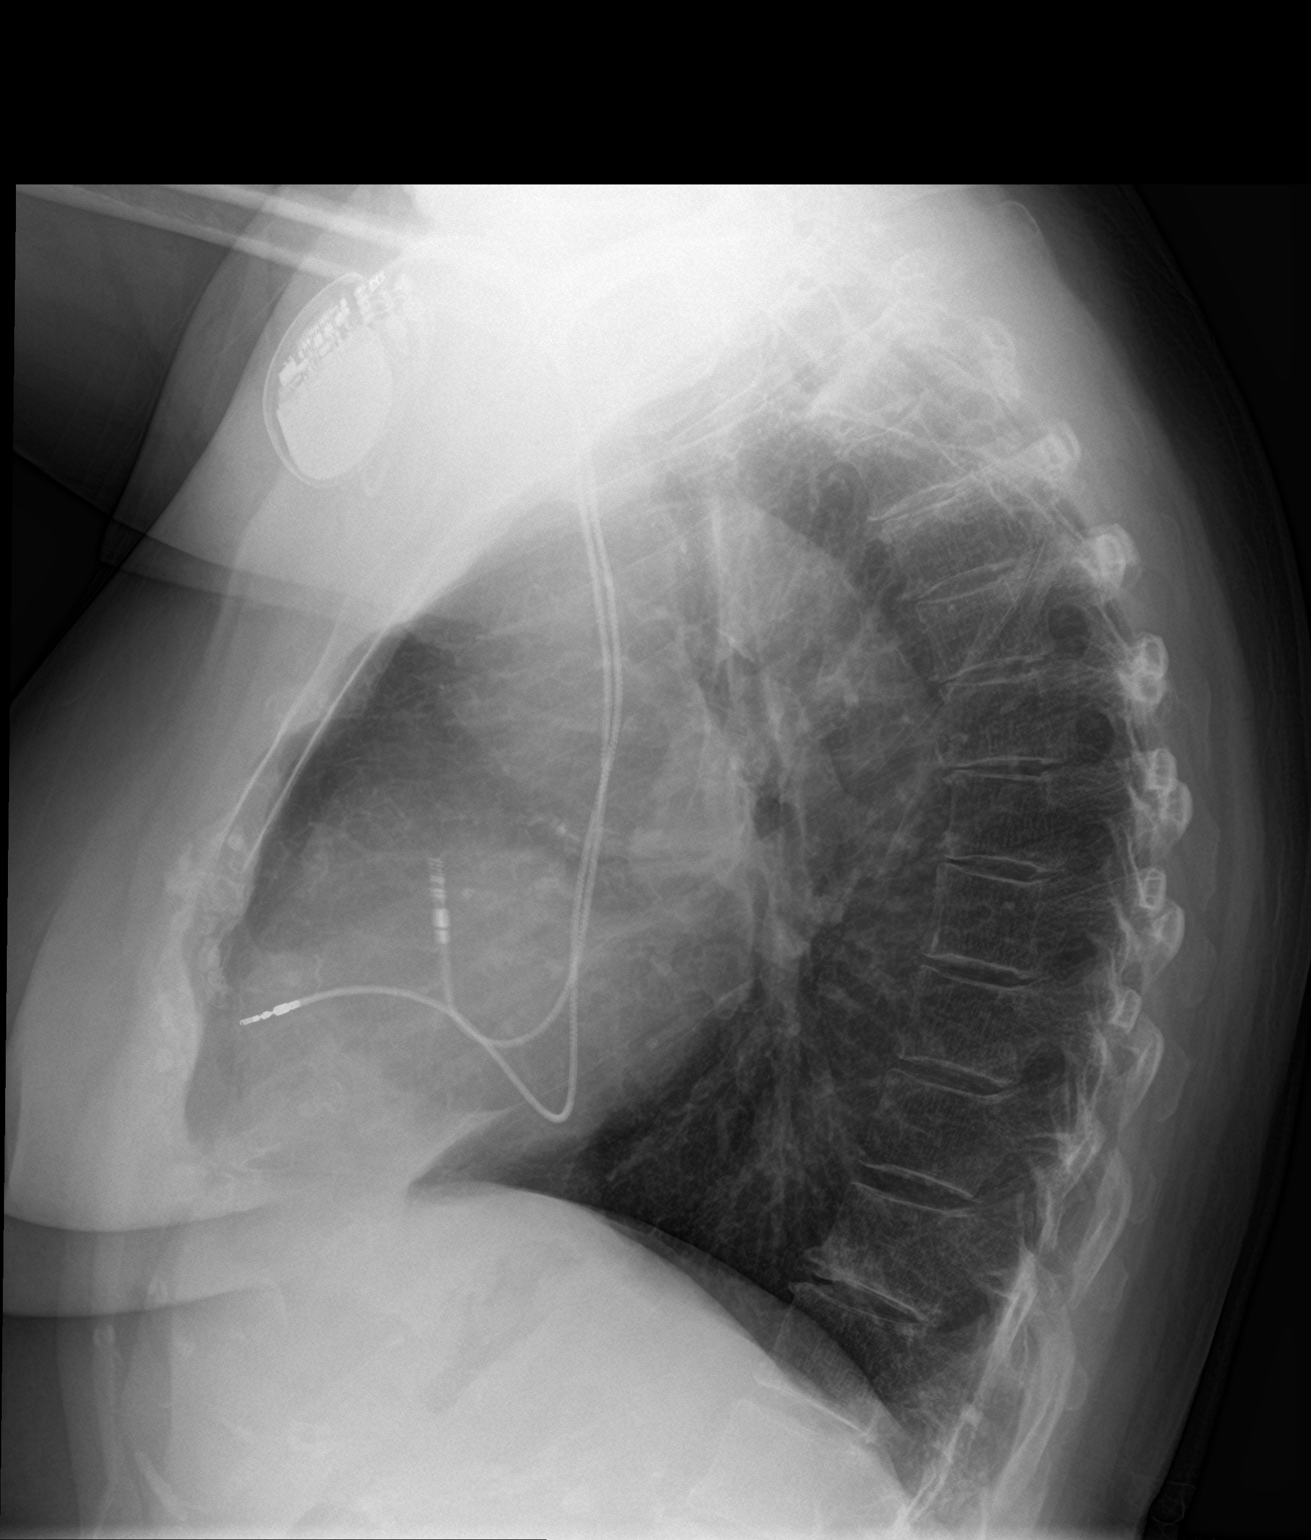

[2 of 2 positions shown; findings below may reference images not displayed]

FINDINGS: There is a dual lead AICD. The heart size and mediastinal contours
are within normal limits. The lungs are hyperinflated. Mild,
diffuse, chronic appearing increased lung markings are noted. There
is no evidence of acute infiltrate, pleural effusion or
pneumothorax. The visualized skeletal structures are unremarkable.
IMPRESSION: No active cardiopulmonary disease.

## 2024-01-10 ENCOUNTER — Ambulatory Visit (INDEPENDENT_AMBULATORY_CARE_PROVIDER_SITE_OTHER): Payer: Medicare HMO

## 2024-01-10 DIAGNOSIS — I480 Paroxysmal atrial fibrillation: Secondary | ICD-10-CM

## 2024-01-11 LAB — CUP PACEART REMOTE DEVICE CHECK
Battery Remaining Longevity: 136 mo
Battery Voltage: 3.02 V
Brady Statistic AP VP Percent: 12.57 %
Brady Statistic AP VS Percent: 0 %
Brady Statistic AS VP Percent: 87.3 %
Brady Statistic AS VS Percent: 0.13 %
Brady Statistic RA Percent Paced: 12.48 %
Brady Statistic RV Percent Paced: 99.87 %
Date Time Interrogation Session: 20250722020310
Implantable Lead Connection Status: 753985
Implantable Lead Connection Status: 753985
Implantable Lead Implant Date: 20110208
Implantable Lead Implant Date: 20110208
Implantable Lead Location: 753859
Implantable Lead Location: 753860
Implantable Lead Model: 5076
Implantable Lead Model: 5076
Implantable Pulse Generator Implant Date: 20240418
Lead Channel Impedance Value: 361 Ohm
Lead Channel Impedance Value: 380 Ohm
Lead Channel Impedance Value: 418 Ohm
Lead Channel Impedance Value: 456 Ohm
Lead Channel Pacing Threshold Amplitude: 0.5 V
Lead Channel Pacing Threshold Amplitude: 0.875 V
Lead Channel Pacing Threshold Pulse Width: 0.4 ms
Lead Channel Pacing Threshold Pulse Width: 0.4 ms
Lead Channel Sensing Intrinsic Amplitude: 2 mV
Lead Channel Sensing Intrinsic Amplitude: 2 mV
Lead Channel Setting Pacing Amplitude: 1.5 V
Lead Channel Setting Pacing Amplitude: 2 V
Lead Channel Setting Pacing Pulse Width: 0.4 ms
Lead Channel Setting Sensing Sensitivity: 1.2 mV
Zone Setting Status: 755011

## 2024-01-12 ENCOUNTER — Ambulatory Visit: Payer: Self-pay | Attending: Cardiovascular Disease | Admitting: Cardiovascular Disease

## 2024-01-12 ENCOUNTER — Encounter: Payer: Self-pay | Admitting: Cardiovascular Disease

## 2024-01-12 VITALS — BP 120/68 | HR 60 | Ht 60.0 in | Wt 138.0 lb

## 2024-01-12 DIAGNOSIS — I442 Atrioventricular block, complete: Secondary | ICD-10-CM

## 2024-01-12 DIAGNOSIS — I10 Essential (primary) hypertension: Secondary | ICD-10-CM

## 2024-01-12 DIAGNOSIS — Z8639 Personal history of other endocrine, nutritional and metabolic disease: Secondary | ICD-10-CM | POA: Diagnosis not present

## 2024-01-12 DIAGNOSIS — I480 Paroxysmal atrial fibrillation: Secondary | ICD-10-CM

## 2024-01-12 DIAGNOSIS — D6869 Other thrombophilia: Secondary | ICD-10-CM | POA: Diagnosis not present

## 2024-01-12 DIAGNOSIS — Z950 Presence of cardiac pacemaker: Secondary | ICD-10-CM

## 2024-01-12 DIAGNOSIS — E7800 Pure hypercholesterolemia, unspecified: Secondary | ICD-10-CM

## 2024-01-12 NOTE — Patient Instructions (Signed)
 Medication Instructions:  No changes *If you need a refill on your cardiac medications before your next appointment, please call your pharmacy*  Lab Work: No orders If you have labs (blood work) drawn today and your tests are completely normal, you will receive your results only by: MyChart Message (if you have MyChart) OR A paper copy in the mail If you have any lab test that is abnormal or we need to change your treatment, we will call you to review the results.  Testing/Procedures: No orders  Follow-Up: At [facility], you and your health needs are our priority.  As part of our continuing mission to provide you with exceptional heart care, our providers are all part of one team.  This team includes your primary Cardiologist (physician) and Advanced Practice Providers or APPs (Physician Assistants and Nurse Practitioners) who all work together to provide you with the care you need, when you need it.  Your next appointment:   1 year(s)  Provider:   Jerel Balding, MD    We recommend signing up for the patient portal called MyChart.  Sign up information is provided on this After Visit Summary.  MyChart is used to connect with patients for Virtual Visits (Telemedicine).  Patients are able to view lab/test results, encounter notes, upcoming appointments, etc.  Non-urgent messages can be sent to your provider as well.   To learn more about what you can do with MyChart, go to [email or web address].

## 2024-01-13 NOTE — Progress Notes (Signed)
 Cardiology Office Note    Date:  01/17/2024   ID:  Sylvia, Santana Nov 18, 1950, MRN [id number]  PCP:  Marvine Rush, MD  Cardiologist:  Dorn Lesches, MD: Jerel Balding, MD   Chief Complaint  Patient presents with   Follow-up  Pacemaker/complete heart block/atrial fibrillation  History of Present Illness:  [name] is a 73 y.o. female with complete heart block, paroxysmal atrial fibrillation, hypertension, hyperlipidemia returning for follow-up.  Her device was implanted for symptomatic bradycardia and she has a history of left bundle branch block. She subsequently has developed complete heart block and is now pacemaker dependent. Her Medtronic Adapta device was implanted in 2011.  Subsequent to the pacemaker implantation she developed episodes of infrequent paroxysmal atrial fibrillation that have been consistently asymptomatic.  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  The episodes of atrial fibrillation were not clearly associated with a thyroid  disorder and have occurred even when she became euthyroid.  Underwent pacemaker generator change out 10/07/2022 Medtronic Azure.  Doing quite well from a cardiovascular point of view.  Has no complaints. The patient specifically denies any chest pain at rest or with exertion, dyspnea at rest or with exertion, orthopnea, paroxysmal nocturnal dyspnea, syncope, palpitations, focal neurological deficits, intermittent claudication, lower extremity edema, unexplained weight gain, cough, hemoptysis or wheezing.  Pacemaker interrogation shows normal device function.  Her pacemaker generator is now roughly 74 year old.  She has a MRI conditional system with a Medtronic Azure DR MRI device and 5076 atrial and ventricular leads.  Estimated generator longevity is 11 years.  Presenting rhythm is atrial sensed ventricular paced and she only requires 4% atrial pacing but has 99.9% ventricular pacing and  is pacemaker dependent due to complete heart block.  She has a excellent activity level at 6.6 hours a day which has been very stable over the last 12 months.  Interestingly she spent most of the time in atrial fibrillation between August 2024 and March 2025, but there has been virtually no atrial fibrillation in the last 3 months.  She has not had any falls or serious bleeding problems while on anticoagulation with Xarelto .  Metabolic control is good with a HDL of 54, LDL 99 and triglycerides 150 (on rosuvastatin , no known CAD or PAD) but she has a borderline hemoglobin A1c of 6.5%.  She is not taking any medicines for diabetes.  She has well-controlled hypertension and takes a statin for hyperlipidemia, with labs monitored via Dr. Marvine. She had a normal nuclear perfusion stress test in May 2017  Past Medical History:  Diagnosis Date   Arthritis    Atrial tachycardia (HCC)    Atypical chest pain    Bundle branch block left    Dr. Maye is cardiologist   Elevated cholesterol    HSV-1 infection    Hypertension    Osteopenia 01/2019   T score -1.8   Pacemaker    Paroxysmal atrial fibrillation (HCC)    rrare episodes on pacemaker interrogation   Wears glasses     Past Surgical History:  Procedure Laterality Date   CARDIAC CATHETERIZATION  2005   Ejection fraction is estimated at  greater than or equal to 60 %  Wall motion is normal.   CAROTID DUPLEX BILATERAL  march 08,2005   Impression : No evidence of of signficant plaque formation or stenosis.   CARPAL TUNNEL RELEASE  04/30/2011   Procedure: CARPAL TUNNEL RELEASE;  Surgeon: Lamar LULLA Leonor Mickey.,  MD;  Location: [facility];  Service: Orthopedics;  Laterality: Left;  left carpal tunnel release, inject left thumb   CARPAL TUNNEL RELEASE  04/28/2012   Procedure: CARPAL TUNNEL RELEASE;  Surgeon: Lamar LULLA Leonor Mickey., MD;  Location: [facility];  Service: Orthopedics;  Laterality: Right;   CATARACT  EXTRACTION W/PHACO Right 03/01/2022   Procedure: CATARACT EXTRACTION PHACO AND INTRAOCULAR LENS PLACEMENT (IOC);  Surgeon: Harrie Agent, MD;  Location: [facility];  Service: Ophthalmology;  Laterality: Right;  CDE: 3.75   CATARACT EXTRACTION W/PHACO Left 03/26/2022   Procedure: CATARACT EXTRACTION PHACO AND INTRAOCULAR LENS PLACEMENT (IOC);  Surgeon: Harrie Agent, MD;  Location: [facility];  Service: Ophthalmology;  Laterality: Left;  CDE 8.45   COLONOSCOPY     COLONOSCOPY  04/14/2012   Procedure: COLONOSCOPY;  Surgeon: Lamar CHRISTELLA Hollingshead, MD;  Location: [facility];  Service: Endoscopy;  Laterality: N/A;  8:30 AM   COLONOSCOPY WITH PROPOFOL  N/A 04/13/2023   Procedure: COLONOSCOPY WITH PROPOFOL ;  Surgeon: Hollingshead Lamar CHRISTELLA, MD;  Location: [facility];  Service: Endoscopy;  Laterality: N/A;  9:00am, asa 3   DORSAL COMPARTMENT RELEASE  04/30/2011   Procedure: RELEASE DORSAL COMPARTMENT (DEQUERVAIN);  Surgeon: Lamar LULLA Leonor Mickey., MD;  Location: [facility];  Service: Orthopedics;  Laterality: Left;  release 1st dorsal compartment on left   INSERT / REPLACE / REMOVE PACEMAKER  07/29/2009   PPM-Dual Chamber Systems Fluoro Guidance Venogram performed 10 ml 's to the left subclavian vein   PACEMAKER INSERTION  07/2009   Dr. Justin  Pacemaker is a Adapta L implanted 07/2009   POLYPECTOMY  04/13/2023   Procedure: POLYPECTOMY;  Surgeon: Hollingshead Lamar CHRISTELLA, MD;  Location: [facility];  Service: Endoscopy;;   PPM GENERATOR CHANGEOUT N/A 10/07/2022   Procedure: PPM GENERATOR CHANGEOUT - DUAL CHAMBER;  Surgeon: Francyne Headland, MD;  Location: [facility];  Service: Cardiovascular;  Laterality: N/A;   RENAL CIRCULATION  07/ 08/ 08   normal renal duplex doppler    TRANSTHORACIC ECHOCARDIOGRAM  10/18/2012   Atrial tracking & V paced rhythm with pacer induced LBBB. No significant intra -ventricular  dyssynchrony.   TUBAL LIGATION  1986    Current Medications: Outpatient Medications Prior to  Visit  Medication Sig Dispense Refill   ABRYSVO 120 MCG/0.5ML injection      acetaminophen  (TYLENOL ) 650 MG CR tablet Take 650 mg by mouth every 8 (eight) hours as needed for pain.     amLODipine  (NORVASC ) 10 MG tablet Take 5 mg by mouth daily.     aspirin-acetaminophen -caffeine (HEADACHE RELIEF) 250-250-65 MG tablet Take 1 tablet by mouth every 8 (eight) hours as needed for headache.     Calcium  Carb-Cholecalciferol (CALCIUM  600+D) 600-20 MG-MCG TABS Take 1 tablet by mouth daily at 6 (six) AM.     Calcium  Carb-Cholecalciferol (CALCIUM -VITAMIN D ) 600-400 MG-UNIT TABS Take 1 tablet by mouth 2 (two) times daily.     celecoxib (CELEBREX) 200 MG capsule Take 200 mg by mouth daily.     cyanocobalamin (VITAMIN B12) 500 MCG tablet Take 500 mcg by mouth daily.     diclofenac Sodium (VOLTAREN) 1 % GEL Apply 1 Application topically 3 (three) times daily as needed (joint pain).     escitalopram (LEXAPRO) 10 MG tablet Take 10 mg by mouth every morning.     lisinopril (ZESTRIL) 20 MG tablet Take 20 mg by mouth daily.     lisinopril-hydrochlorothiazide (ZESTORETIC) 20-12.5 MG tablet Take 1  tablet by mouth daily.     Menthol, Topical Analgesic, (BIOFREEZE) 4 % GEL Apply 1 application  topically as needed.     metoprolol  succinate (TOPROL -XL) 100 MG 24 hr tablet Take 1 tablet (100 mg total) by mouth daily. (Patient taking differently: Take 50 mg by mouth 2 (two) times daily.) 90 tablet 3   Multiple Vitamins-Minerals (WOMENS 50+ MULTI VITAMIN/MIN PO) Take 1 tablet by mouth daily.     Omega-3 Fatty Acids (FISH OIL) 500 MG CAPS Take 500 mg by mouth daily. Taken at [employer] and in the Afternoon (3p)     OVER THE COUNTER MEDICATION Take 1 tablet by mouth daily at 6 (six) AM.     phenylephrine  (EQ NASAL FOUR) 1 % nasal spray Place 1 drop into both nostrils every 6 (six) hours as needed for congestion.     rivaroxaban  (XARELTO ) 20 MG TABS tablet TAKE 1 TABLET BY MOUTH ONCE DAILY WITH SUPPER 90 tablet 1   rosuvastatin   (CRESTOR ) 10 MG tablet Take 1 tablet (10 mg total) by mouth daily. 90 tablet 0   valACYclovir (VALTREX) 1000 MG tablet Take 1,000 mg by mouth 2 (two) times daily as needed.     Vitamin D -Vitamin K (D3 + K2) 125-100 MCG CAPS Take 1 capsule by mouth daily at 6 (six) AM.     amLODipine  (NORVASC ) 5 MG tablet Take 1 tablet (5 mg total) by mouth daily. (Patient not taking: Reported on 10/28/2023)     cholecalciferol (VITAMIN D3) 25 MCG (1000 UNIT) tablet Take 1,000 Units by mouth daily. (Patient not taking: Reported on 01/12/2024)     No facility-administered medications prior to visit.     Allergies:   Vicodin [hydrocodone-acetaminophen ], Atorvastatin , and Atorvastatin  calcium    Social History   Socioeconomic History   Marital status: Married    Spouse name: Not on file   Number of children: Not on file   Years of education: Not on file   Highest education level: Not on file  Occupational History   Not on file  Tobacco Use   Smoking status: Former    Current packs/day: 1.50    Average packs/day: 1.5 packs/day for 30.0 years (45.0 ttl pk-yrs)    Types: Cigarettes   Smokeless tobacco: Never  Vaping Use   Vaping status: Never Used  Substance and Sexual Activity   Alcohol use: Yes    Alcohol/week: 7.0 standard drinks of alcohol    Types: 7 Glasses of wine per week   Drug use: No   Sexual activity: Not Currently    Birth control/protection: Post-menopausal, Surgical    Comment: Tubal lig-1st intercourse 73 yo-Fewer than 5 partners  Other Topics Concern   Not on file  Social History Narrative   Not on file   Social Drivers of Health   Financial Resource Strain: Not on file  Food Insecurity: Not on file  Transportation Needs: Not on file  Physical Activity: Not on file  Stress: Not on file  Social Connections: Not on file     Family History:  The patient's family history includes Colon cancer in her maternal aunt; Diabetes in her father; Heart disease in her mother and paternal  grandfather.   ROS:   Please see the history of present illness.    ROS All other systems are reviewed and are negative.   PHYSICAL EXAM:   VS:  BP 120/68 (BP Location: Left Arm, Patient Position: Sitting, Cuff Size: Normal)   Pulse 60   Ht 5' (1.524  m)   Wt 138 lb (62.6 kg)   SpO2 98%   BMI 26.95 kg/m      General: Alert, oriented x3, no distress, healthy left subclavian pacemaker site pacemaker Head: no evidence of trauma, PERRL, EOMI, no exophtalmos or lid lag, no myxedema, no xanthelasma; normal ears, nose and oropharynx Neck: normal jugular venous pulsations and no hepatojugular reflux; brisk carotid pulses without delay and no carotid bruits Chest: clear to auscultation, no signs of consolidation by percussion or palpation, normal fremitus, symmetrical and full respiratory excursions Cardiovascular: normal position and quality of the apical impulse, regular rhythm, normal first and second heart sounds, no murmurs, rubs or gallops Abdomen: no tenderness or distention, no masses by palpation, no abnormal pulsatility or arterial bruits, normal bowel sounds, no hepatosplenomegaly Extremities: no clubbing, cyanosis or edema; 2+ radial, ulnar and brachial pulses bilaterally; 2+ right femoral, posterior tibial and dorsalis pedis pulses; 2+ left femoral, posterior tibial and dorsalis pedis pulses; no subclavian or femoral bruits Neurological: grossly nonfocal Psych: Normal mood and affect     Wt Readings from Last 3 Encounters:  01/12/24 138 lb (62.6 kg)  10/28/23 135 lb (61.2 kg)  04/13/23 138 lb (62.6 kg)      Studies/Labs Reviewed:   EKG:   EKG Interpretation Date/Time:  Thursday January 12 2024 10:44:59 EDT Ventricular Rate:  60 PR Interval:  136 QRS Duration:  168 QT Interval:  494 QTC Calculation: 494 R Axis:   -65  Text Interpretation: AV dual-paced rhythm When compared with ECG of 11-Apr-2023 10:19, No significant change was found Confirmed by Rosario Kushner  ([id number]) on 01/13/2024 5:44:26 PM         Recent Labs: 04/11/2023: BUN 16; Creatinine, Ser 0.89; Potassium 3.4; Sodium 137 10/28/2023: TSH 2.43   Lipid Panel    Component Value Date/Time   CHOL 187 04/28/2020 0813   TRIG 154 (H) 04/28/2020 0813   HDL 55 04/28/2020 0813   CHOLHDL 3.4 04/28/2020 0813   CHOLHDL 3.3 03/28/2015 0859   VLDL 27 03/28/2015 0859   LDLCALC 105 (H) 04/28/2020 0813    April 17, 2019 Total cholesterol 171, HDL 55, LDL 88, triglycerides 162 Hemoglobin A1c 5.5% Hemoglobin 14.4, creatinine 0.87, potassium 5.3, normal liver function tests TSH 3.32 on 12/18/2019  07/11/2021 Cholesterol 151, HDL 55, LDL 73, triglycerides 130 Hemoglobin A1c 5.8% Hemoglobin 13.0, creatinine 0.89, potassium 4.3, ALT 25  12/09/2021 TSH 2.510 ASSESSMENT:    1. CHB (complete heart block) (HCC)   2. Pacemaker   3. Paroxysmal atrial fibrillation (HCC)   4. Acquired thrombophilia (HCC)   5. Essential hypertension   6. Hypercholesterolemia   7. History of hyperthyroidism      PLAN:  In order of problems listed above:  CHB: Pacemaker dependent PPM: MRI conditional system.  Normal device function.  Continue remote downloads every 3 months. AFib: Markable increase in the overall burden of atrial fibrillation last year, but also remarkable absence of atrial fibrillation last 3 months.  She has never been aware of the arrhythmia.  Appropriately anticoagulated.  CHADVasc score of 3 (age, gender, HTN). Xarelto : Has not had any falls or bleeding problems HTN: Excellent blood pressure control on the current medications.  Continue amlodipine  10 mg daily, lisinopril 20 mg daily, lisinopril hydrochlorothiazide 20/12.5 mg daily and metoprolol  succinate 50 mg twice daily she does not HLP: Have known CAD or PAD.  LDL cholesterol less than 899 is appropriate.. Hyperthyroidism: Clinically euthyroid.  Her TSH was 2.430, fully in normal range, roughly  2 months ago.  No longer on antithyroid  medications.    Medication Adjustments/Labs and Tests Ordered: Current medicines are reviewed at length with the patient today.  Concerns regarding medicines are outlined above.  Medication changes, Labs and Tests ordered today are listed in the Patient Instructions below. Patient Instructions  Medication Instructions:  No changes *If you need a refill on your cardiac medications before your next appointment, please call your pharmacy*  Lab Work: No orders If you have labs (blood work) drawn today and your tests are completely normal, you will receive your results only by: MyChart Message (if you have MyChart) OR A paper copy in the mail If you have any lab test that is abnormal or we need to change your treatment, we will call you to review the results.  Testing/Procedures: No orders  Follow-Up: At [facility], you and your health needs are our priority.  As part of our continuing mission to provide you with exceptional heart care, our providers are all part of one team.  This team includes your primary Cardiologist (physician) and Advanced Practice Providers or APPs (Physician Assistants and Nurse Practitioners) who all work together to provide you with the care you need, when you need it.  Your next appointment:   1 year(s)  Provider:   Jerel Balding, MD    We recommend signing up for the patient portal called MyChart.  Sign up information is provided on this After Visit Summary.  MyChart is used to connect with patients for Virtual Visits (Telemedicine).  Patients are able to view lab/test results, encounter notes, upcoming appointments, etc.  Non-urgent messages can be sent to your provider as well.   To learn more about what you can do with MyChart, go to [email or web address].          Signed, Jerel Balding, MD  01/17/2024 7:12 PM    [facility] [address] [place], [place], [place]  [address] Phone: [phone]; Fax: [phone]

## 2024-01-14 ENCOUNTER — Ambulatory Visit: Payer: Self-pay | Admitting: Cardiovascular Disease

## 2024-01-24 ENCOUNTER — Encounter: Payer: Self-pay | Admitting: Family Medicine

## 2024-01-24 ENCOUNTER — Ambulatory Visit: Admitting: Family Medicine

## 2024-01-24 VITALS — BP 120/70 | HR 66 | Temp 97.7°F | Ht 60.0 in | Wt 137.8 lb

## 2024-01-24 DIAGNOSIS — I10 Essential (primary) hypertension: Secondary | ICD-10-CM | POA: Diagnosis not present

## 2024-01-24 DIAGNOSIS — E7800 Pure hypercholesterolemia, unspecified: Secondary | ICD-10-CM

## 2024-01-24 DIAGNOSIS — I2510 Atherosclerotic heart disease of native coronary artery without angina pectoris: Secondary | ICD-10-CM | POA: Diagnosis not present

## 2024-01-24 DIAGNOSIS — Z950 Presence of cardiac pacemaker: Secondary | ICD-10-CM | POA: Diagnosis not present

## 2024-01-24 DIAGNOSIS — E0590 Thyrotoxicosis, unspecified without thyrotoxic crisis or storm: Secondary | ICD-10-CM | POA: Diagnosis not present

## 2024-01-24 NOTE — Progress Notes (Signed)
 Subjective:    Patient ID: [name], female    DOB: 12-23-1950, 73 y.o.   MRN: [id number]  HPI  Patient is a very pleasant 73 year old Caucasian female here today to establish care with me.  Her last mammogram was in November 24.  She is due for that in November of this year and she had a colonoscopy October 2024.  She does not require another colonoscopy due to age.  Likewise, due to her age she does not require Pap smear.  Her bone density test showed mild osteopenia but is not due again for 2 to 3 years.  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  She also has a history of hypertension and hyperlipidemia.  She has a history of atrial fibrillation.  On a CAT scan several years ago, she was found to have total coronary artery atherosclerosis.  Interested in a coronary artery calcium  for risk stratification.  Otherwise she is doing well with no concerns.  She is due for fasting lab work. Past Medical History:  Diagnosis Date   Arthritis    Atrial tachycardia (HCC)    Atypical chest pain    Bundle branch block left    Dr. Maye is cardiologist   Elevated cholesterol    HSV-1 infection    Hypertension    Osteopenia 01/2019   T score -1.8   Pacemaker    Paroxysmal atrial fibrillation (HCC)    rrare episodes on pacemaker interrogation   Wears glasses    Past Surgical History:  Procedure Laterality Date   CARDIAC CATHETERIZATION  2005   Ejection fraction is estimated at  greater than or equal to 60 %  Wall motion is normal.   CAROTID DUPLEX BILATERAL  march 08,2005   Impression : No evidence of of signficant plaque formation or stenosis.   CARPAL TUNNEL RELEASE  04/30/2011   Procedure: CARPAL TUNNEL RELEASE;  Surgeon: Lamar LULLA Leonor Mickey., MD;  Location: [facility];  Service: Orthopedics;  Laterality: Left;  left carpal tunnel release, inject left thumb   CARPAL TUNNEL RELEASE  04/28/2012   Procedure: CARPAL TUNNEL RELEASE;  Surgeon: Lamar LULLA Leonor Mickey.,  MD;  Location: [facility];  Service: Orthopedics;  Laterality: Right;   CATARACT EXTRACTION W/PHACO Right 03/01/2022   Procedure: CATARACT EXTRACTION PHACO AND INTRAOCULAR LENS PLACEMENT (IOC);  Surgeon: Harrie Agent, MD;  Location: [facility];  Service: Ophthalmology;  Laterality: Right;  CDE: 3.75   CATARACT EXTRACTION W/PHACO Left 03/26/2022   Procedure: CATARACT EXTRACTION PHACO AND INTRAOCULAR LENS PLACEMENT (IOC);  Surgeon: Harrie Agent, MD;  Location: [facility];  Service: Ophthalmology;  Laterality: Left;  CDE 8.45   COLONOSCOPY     COLONOSCOPY  04/14/2012   Procedure: COLONOSCOPY;  Surgeon: Lamar CHRISTELLA Hollingshead, MD;  Location: [facility];  Service: Endoscopy;  Laterality: N/A;  8:30 AM   COLONOSCOPY WITH PROPOFOL  N/A 04/13/2023   Procedure: COLONOSCOPY WITH PROPOFOL ;  Surgeon: Hollingshead Lamar CHRISTELLA, MD;  Location: [facility];  Service: Endoscopy;  Laterality: N/A;  9:00am, asa 3   DORSAL COMPARTMENT RELEASE  04/30/2011   Procedure: RELEASE DORSAL COMPARTMENT (DEQUERVAIN);  Surgeon: Lamar LULLA Leonor Mickey., MD;  Location: [facility];  Service: Orthopedics;  Laterality: Left;  release 1st dorsal compartment on left   INSERT / REPLACE / REMOVE PACEMAKER  07/29/2009   PPM-Dual Chamber Systems Fluoro Guidance Venogram performed 10 ml 's to the left subclavian vein   PACEMAKER  INSERTION  07/2009   Dr. Justin  Pacemaker is a Adapta L implanted 07/2009   POLYPECTOMY  04/13/2023   Procedure: POLYPECTOMY;  Surgeon: Shaaron Lamar HERO, MD;  Location: [facility];  Service: Endoscopy;;   PPM GENERATOR CHANGEOUT N/A 10/07/2022   Procedure: PPM GENERATOR CHANGEOUT - DUAL CHAMBER;  Surgeon: Francyne Headland, MD;  Location: [facility];  Service: Cardiovascular;  Laterality: N/A;   RENAL CIRCULATION  07/ 08/ 08   normal renal duplex doppler    TRANSTHORACIC ECHOCARDIOGRAM  10/18/2012   Atrial tracking & V paced rhythm with pacer induced LBBB. No significant intra -ventricular   dyssynchrony.   TUBAL LIGATION  1986   Current Outpatient Medications on File Prior to Visit  Medication Sig Dispense Refill   ABRYSVO 120 MCG/0.5ML injection      acetaminophen  (TYLENOL ) 650 MG CR tablet Take 650 mg by mouth every 8 (eight) hours as needed for pain.     amLODipine  (NORVASC ) 10 MG tablet Take 5 mg by mouth daily.     aspirin-acetaminophen -caffeine (HEADACHE RELIEF) 250-250-65 MG tablet Take 1 tablet by mouth every 8 (eight) hours as needed for headache.     Calcium  Carb-Cholecalciferol (CALCIUM  600+D) 600-20 MG-MCG TABS Take 1 tablet by mouth daily at 6 (six) AM.     celecoxib (CELEBREX) 200 MG capsule Take 200 mg by mouth daily.     cyanocobalamin (VITAMIN B12) 500 MCG tablet Take 500 mcg by mouth daily.     diclofenac Sodium (VOLTAREN) 1 % GEL Apply 1 Application topically 3 (three) times daily as needed (joint pain).     escitalopram (LEXAPRO) 10 MG tablet Take 10 mg by mouth every morning.     lisinopril (ZESTRIL) 20 MG tablet Take 20 mg by mouth daily.     lisinopril-hydrochlorothiazide (ZESTORETIC) 20-12.5 MG tablet Take 1 tablet by mouth daily.     Menthol, Topical Analgesic, (BIOFREEZE) 4 % GEL Apply 1 application  topically as needed.     metoprolol  succinate (TOPROL -XL) 100 MG 24 hr tablet Take 1 tablet (100 mg total) by mouth daily. 90 tablet 3   Multiple Vitamins-Minerals (WOMENS 50+ MULTI VITAMIN/MIN PO) Take 1 tablet by mouth daily.     Omega-3 Fatty Acids (FISH OIL) 500 MG CAPS Take 500 mg by mouth daily. Taken at [employer] and in the Afternoon (3p)     rivaroxaban  (XARELTO ) 20 MG TABS tablet TAKE 1 TABLET BY MOUTH ONCE DAILY WITH SUPPER 90 tablet 1   rosuvastatin  (CRESTOR ) 10 MG tablet Take 1 tablet (10 mg total) by mouth daily. 90 tablet 0   valACYclovir (VALTREX) 1000 MG tablet Take 1,000 mg by mouth 2 (two) times daily as needed.     Vitamin D -Vitamin K (D3 + K2) 125-100 MCG CAPS Take 1 capsule by mouth daily at 6 (six) AM.     No current facility-administered  medications on file prior to visit.   Allergies  Allergen Reactions   Vicodin [Hydrocodone-Acetaminophen ] Nausea Only   Atorvastatin  Other (See Comments)    Muscle/joint pain  atorvastatin    Social History   Socioeconomic History   Marital status: Married    Spouse name: Not on file   Number of children: Not on file   Years of education: Not on file   Highest education level: Associate degree: academic program  Occupational History   Not on file  Tobacco Use   Smoking status: Former    Current packs/day: 1.50    Average packs/day: 1.5 packs/day for  30.0 years (45.0 ttl pk-yrs)    Types: Cigarettes   Smokeless tobacco: Never  Vaping Use   Vaping status: Never Used  Substance and Sexual Activity   Alcohol use: Yes    Alcohol/week: 7.0 standard drinks of alcohol    Types: 7 Glasses of wine per week   Drug use: No   Sexual activity: Not Currently    Birth control/protection: Post-menopausal, Surgical    Comment: Tubal lig-1st intercourse 73 yo-Fewer than 5 partners  Other Topics Concern   Not on file  Social History Narrative   Not on file   Social Drivers of Health   Financial Resource Strain: Low Risk  (01/21/2024)   Overall Financial Resource Strain (CARDIA)    Difficulty of Paying Living Expenses: Not hard at all  Food Insecurity: No Food Insecurity (01/21/2024)   Hunger Vital Sign    Worried About Running Out of Food in the Last Year: Never true    Ran Out of Food in the Last Year: Never true  Transportation Needs: No Transportation Needs (01/21/2024)   PRAPARE - Administrator, Civil Service (Medical): No    Lack of Transportation (Non-Medical): No  Physical Activity: Insufficiently Active (01/21/2024)   Exercise Vital Sign    Days of Exercise per Week: 2 days    Minutes of Exercise per Session: 30 min  Stress: No Stress Concern Present (01/21/2024)   [employer] of Occupational Health - Occupational Stress Questionnaire    Feeling of Stress: Not  at all  Social Connections: Socially Integrated (01/21/2024)   Social Connection and Isolation Panel    Frequency of Communication with Friends and Family: Twice a week    Frequency of Social Gatherings with Friends and Family: Twice a week    Attends Religious Services: More than 4 times per year    Active Member of [employer] or Organizations: Yes    Attends Engineer, structural: More than 4 times per year    Marital Status: Married  Catering manager Violence: Not on file   Family History  Problem Relation Age of Onset   Heart disease Mother    Diabetes Father    Colon cancer Maternal Aunt    Heart disease Paternal Grandfather      Review of Systems     Objective:   Physical Exam Vitals reviewed.  Constitutional:      Appearance: Normal appearance. She is normal weight.  HENT:     Mouth/Throat:     Pharynx: No oropharyngeal exudate or posterior oropharyngeal erythema.  Eyes:     Extraocular Movements: Extraocular movements intact.     Pupils: Pupils are equal, round, and reactive to light.  Neck:     Vascular: No carotid bruit.  Cardiovascular:     Rate and Rhythm: Normal rate and regular rhythm.     Heart sounds: Normal heart sounds. No murmur heard.    No gallop.  Pulmonary:     Effort: Pulmonary effort is normal. No respiratory distress.     Breath sounds: Normal breath sounds. No wheezing, rhonchi or rales.  Abdominal:     General: Bowel sounds are normal. There is no distension.     Palpations: Abdomen is soft. There is no mass.     Tenderness: There is no abdominal tenderness.  Musculoskeletal:     Right lower leg: No edema.     Left lower leg: No edema.  Lymphadenopathy:     Cervical: No cervical adenopathy.  Neurological:  General: No focal deficit present.     Mental Status: She is alert and oriented to person, place, and time. Mental status is at baseline.     Cranial Nerves: No cranial nerve deficit.     Motor: No weakness.     Coordination:  Coordination normal.     Gait: Gait normal.  Psychiatric:        Mood and Affect: Mood normal.        Behavior: Behavior normal.           Assessment & Plan:   Atherosclerosis of native coronary artery of native heart without angina pectoris - Plan: CT CARDIAC SCORING (SELF PAY ONLY)  Essential hypertension  Hypercholesterolemia  Hyperthyroidism  History of permanent cardiac pacemaker placement Patient has a history of hypothyroidism however she has been off methimazole  for 2 years.  I will recheck a TSH when she comes in for fasting lab work.  Her blood pressure today is well-controlled.  Patient will schedule her mammogram in November.  Her bone density test is not due again until 2027.  Her Pap smear and her colonoscopy are up-to-date.  I have asked her to come in fasting for CBC a CMP and a lipid panel.  I would like her LDL cholesterol to be less than 70.  We will obtain a coronary artery calcium  score per the patient's request significantly elevated, we may need to try to get her LDL cholesterol less than 55.

## 2024-01-25 ENCOUNTER — Other Ambulatory Visit

## 2024-01-25 DIAGNOSIS — E0590 Thyrotoxicosis, unspecified without thyrotoxic crisis or storm: Secondary | ICD-10-CM

## 2024-01-25 DIAGNOSIS — I10 Essential (primary) hypertension: Secondary | ICD-10-CM | POA: Diagnosis not present

## 2024-01-25 DIAGNOSIS — I2510 Atherosclerotic heart disease of native coronary artery without angina pectoris: Secondary | ICD-10-CM

## 2024-01-25 DIAGNOSIS — E7800 Pure hypercholesterolemia, unspecified: Secondary | ICD-10-CM

## 2024-01-26 ENCOUNTER — Ambulatory Visit: Payer: Self-pay | Admitting: Family Medicine

## 2024-01-26 LAB — COMPREHENSIVE METABOLIC PANEL WITH GFR
AG Ratio: 2 (calc) (ref 1.0–2.5)
ALT: 17 U/L (ref 6–29)
AST: 18 U/L (ref 10–35)
Albumin: 4.3 g/dL (ref 3.6–5.1)
Alkaline phosphatase (APISO): 50 U/L (ref 37–153)
BUN: 16 mg/dL (ref 7–25)
CO2: 32 mmol/L (ref 20–32)
Calcium: 9.5 mg/dL (ref 8.6–10.4)
Chloride: 105 mmol/L (ref 98–110)
Creat: 0.92 mg/dL (ref 0.60–1.00)
Globulin: 2.2 g/dL (ref 1.9–3.7)
Glucose, Bld: 102 mg/dL — ABNORMAL HIGH (ref 65–99)
Potassium: 4.3 mmol/L (ref 3.5–5.3)
Sodium: 144 mmol/L (ref 135–146)
Total Bilirubin: 0.6 mg/dL (ref 0.2–1.2)
Total Protein: 6.5 g/dL (ref 6.1–8.1)
eGFR: 65 mL/min/1.73m2 (ref >=60)

## 2024-01-26 LAB — CBC WITH DIFFERENTIAL/PLATELET
Absolute Lymphocytes: 1355 {cells}/uL (ref 850–3900)
Absolute Monocytes: 500 {cells}/uL (ref 200–950)
Basophils Absolute: 40 {cells}/uL (ref 0–200)
Basophils Relative: 0.8 %
Eosinophils Absolute: 380 {cells}/uL (ref 15–500)
Eosinophils Relative: 7.6 %
HCT: 38.8 % (ref 35.0–45.0)
Hemoglobin: 13.2 g/dL (ref 11.7–15.5)
MCH: 35 pg — ABNORMAL HIGH (ref 27.0–33.0)
MCHC: 34 g/dL (ref 32.0–36.0)
MCV: 102.9 fL — ABNORMAL HIGH (ref 80.0–100.0)
MPV: 9.9 fL (ref 7.5–12.5)
Monocytes Relative: 10 %
Neutro Abs: 2725 {cells}/uL (ref 1500–7800)
Neutrophils Relative %: 54.5 %
Platelets: 206 Thousand/uL (ref 140–400)
RBC: 3.77 Million/uL — ABNORMAL LOW (ref 3.80–5.10)
RDW: 11.8 % (ref 11.0–15.0)
Total Lymphocyte: 27.1 %
WBC: 5 Thousand/uL (ref 3.8–10.8)

## 2024-01-26 LAB — LIPID PANEL
Cholesterol: 161 mg/dL (ref <200)
HDL: 55 mg/dL (ref >=50)
LDL Cholesterol (Calc): 80 mg/dL
Non-HDL Cholesterol (Calc): 106 mg/dL (ref <130)
Total CHOL/HDL Ratio: 2.9 (calc) (ref <5.0)
Triglycerides: 165 mg/dL — ABNORMAL HIGH (ref <150)

## 2024-01-26 LAB — TSH: TSH: 3.02 m[IU]/L (ref 0.40–4.50)

## 2024-02-23 ENCOUNTER — Telehealth: Payer: Self-pay | Admitting: Family Medicine

## 2024-02-23 ENCOUNTER — Other Ambulatory Visit: Payer: Self-pay

## 2024-02-23 DIAGNOSIS — I10 Essential (primary) hypertension: Secondary | ICD-10-CM

## 2024-02-23 MED ORDER — LISINOPRIL-HYDROCHLOROTHIAZIDE 20-12.5 MG PO TABS: 1.0000 | ORAL_TABLET | Freq: Every day | ORAL | 1 refills | Status: DC

## 2024-02-23 NOTE — Telephone Encounter (Signed)
 Prescription Request  02/23/2024  LOV: 01/24/2024  What is the name of the medication or equipment? lisinopril -hydrochlorothiazide  (ZESTORETIC ) 20-12.5 MG tablet   Have you contacted your pharmacy to request a refill? Yes   Which pharmacy would you like this sent to?  [facility] [address], [place] - [address] [place] [address] Phone: [phone] Fax: [phone]    Patient notified that their request is being sent to the clinical staff for review and that they should receive a response within 2 business days.   Please advise at [facility] [phone]

## 2024-03-08 ENCOUNTER — Ambulatory Visit (HOSPITAL_COMMUNITY)
Admission: RE | Admit: 2024-03-08 | Discharge: 2024-03-08 | Disposition: A | Discharge status: Home / Self Care | Payer: Self-pay | Source: Ambulatory Visit | Attending: Family Medicine | Admitting: Family Medicine

## 2024-03-08 DIAGNOSIS — I2510 Atherosclerotic heart disease of native coronary artery without angina pectoris: Secondary | ICD-10-CM | POA: Insufficient documentation

## 2024-03-20 ENCOUNTER — Encounter: Payer: Self-pay | Admitting: Cardiovascular Disease

## 2024-03-20 MED ORDER — ROSUVASTATIN CALCIUM 20 MG PO TABS: 20.0000 mg | ORAL_TABLET | Freq: Every day | ORAL | 3 refills | Status: AC

## 2024-03-22 ENCOUNTER — Ambulatory Visit: Admitting: *Deleted

## 2024-03-22 VITALS — Ht 60.0 in | Wt 137.0 lb

## 2024-03-22 DIAGNOSIS — Z0000 Encounter for general adult medical examination without abnormal findings: Secondary | ICD-10-CM

## 2024-03-22 NOTE — Patient Instructions (Signed)
 [name] , Thank you for taking time to come for your Medicare Wellness Visit. I appreciate your ongoing commitment to your health goals. Please review the following plan we discussed and let me know if I can assist you in the future.   Screening recommendations/referrals: Colonoscopy:  Mammogram: up to date Bone Density: up to date Recommended yearly ophthalmology/optometry visit for glaucoma screening and checkup Recommended yearly dental visit for hygiene and checkup  Vaccinations: Influenza vaccine:  Pneumococcal vaccine:  Tdap vaccine:  Shingles vaccine:       Preventive Care 65 Years and Older, Female Preventive care refers to lifestyle choices and visits with your health care provider that can promote health and wellness. What does preventive care include? A yearly physical exam. This is also called an annual well check. Dental exams once or twice a year. Routine eye exams. Ask your health care provider how often you should have your eyes checked. Personal lifestyle choices, including: Daily care of your teeth and gums. Regular physical activity. Eating a healthy diet. Avoiding tobacco and drug use. Limiting alcohol use. Practicing safe sex. Taking low-dose aspirin every day. Taking vitamin and mineral supplements as recommended by your health care provider. What happens during an annual well check? The services and screenings done by your health care provider during your annual well check will depend on your age, overall health, lifestyle risk factors, and family history of disease. Counseling  Your health care provider may ask you questions about your: Alcohol use. Tobacco use. Drug use. Emotional well-being. Home and relationship well-being. Sexual activity. Eating habits. History of falls. Memory and ability to understand (cognition). Work and work Astronomer. Reproductive health. Screening  You may have the following tests or measurements: Height,  weight, and BMI. Blood pressure. Lipid and cholesterol levels. These may be checked every 5 years, or more frequently if you are over 70 years old. Skin check. Lung cancer screening. You may have this screening every year starting at age 47 if you have a 30-pack-year history of smoking and currently smoke or have quit within the past 15 years. Fecal occult blood test (FOBT) of the stool. You may have this test every year starting at age 73. Flexible sigmoidoscopy or colonoscopy. You may have a sigmoidoscopy every 5 years or a colonoscopy every 10 years starting at age 87. Hepatitis C blood test. Hepatitis B blood test. Sexually transmitted disease (STD) testing. Diabetes screening. This is done by checking your blood sugar (glucose) after you have not eaten for a while (fasting). You may have this done every 1-3 years. Bone density scan. This is done to screen for osteoporosis. You may have this done starting at age 1. Mammogram. This may be done every 1-2 years. Talk to your health care provider about how often you should have regular mammograms. Talk with your health care provider about your test results, treatment options, and if necessary, the need for more tests. Vaccines  Your health care provider may recommend certain vaccines, such as: Influenza vaccine. This is recommended every year. Tetanus, diphtheria, and acellular pertussis (Tdap, Td) vaccine. You may need a Td booster every 10 years. Zoster vaccine. You may need this after age 75. Pneumococcal 13-valent conjugate (PCV13) vaccine. One dose is recommended after age 13. Pneumococcal polysaccharide (PPSV23) vaccine. One dose is recommended after age 74. Talk to your health care provider about which screenings and vaccines you need and how often you need them. This information is not intended to replace advice given to you  by your health care provider. Make sure you discuss any questions you have with your health care  provider. Document Released: 07/04/2015 Document Revised: 02/25/2016 Document Reviewed: 04/08/2015 Elsevier Interactive Patient Education  2017 [employer].  Fall Prevention in the Home Falls can cause injuries. They can happen to people of all ages. There are many things you can do to make your home safe and to help prevent falls. What can I do on the outside of my home? Regularly fix the edges of walkways and driveways and fix any cracks. Remove anything that might make you trip as you walk through a door, such as a raised step or threshold. Trim any bushes or trees on the path to your home. Use bright outdoor lighting. Clear any walking paths of anything that might make someone trip, such as rocks or tools. Regularly check to see if handrails are loose or broken. Make sure that both sides of any steps have handrails. Any raised decks and porches should have guardrails on the edges. Have any leaves, snow, or ice cleared regularly. Use sand or salt on walking paths during winter. Clean up any spills in your garage right away. This includes oil or grease spills. What can I do in the bathroom? Use night lights. Install grab bars by the toilet and in the tub and shower. Do not use towel bars as grab bars. Use non-skid mats or decals in the tub or shower. If you need to sit down in the shower, use a plastic, non-slip stool. Keep the floor dry. Clean up any water  that spills on the floor as soon as it happens. Remove soap buildup in the tub or shower regularly. Attach bath mats securely with double-sided non-slip rug tape. Do not have throw rugs and other things on the floor that can make you trip. What can I do in the bedroom? Use night lights. Make sure that you have a light by your bed that is easy to reach. Do not use any sheets or blankets that are too big for your bed. They should not hang down onto the floor. Have a firm chair that has side arms. You can use this for support while  you get dressed. Do not have throw rugs and other things on the floor that can make you trip. What can I do in the kitchen? Clean up any spills right away. Avoid walking on wet floors. Keep items that you use a lot in easy-to-reach places. If you need to reach something above you, use a strong step stool that has a grab bar. Keep electrical cords out of the way. Do not use floor polish or wax that makes floors slippery. If you must use wax, use non-skid floor wax. Do not have throw rugs and other things on the floor that can make you trip. What can I do with my stairs? Do not leave any items on the stairs. Make sure that there are handrails on both sides of the stairs and use them. Fix handrails that are broken or loose. Make sure that handrails are as long as the stairways. Check any carpeting to make sure that it is firmly attached to the stairs. Fix any carpet that is loose or worn. Avoid having throw rugs at the top or bottom of the stairs. If you do have throw rugs, attach them to the floor with carpet tape. Make sure that you have a light switch at the top of the stairs and the bottom of the stairs. If  you do not have them, ask someone to add them for you. What else can I do to help prevent falls? Wear shoes that: Do not have high heels. Have rubber bottoms. Are comfortable and fit you well. Are closed at the toe. Do not wear sandals. If you use a stepladder: Make sure that it is fully opened. Do not climb a closed stepladder. Make sure that both sides of the stepladder are locked into place. Ask someone to hold it for you, if possible. Clearly mark and make sure that you can see: Any grab bars or handrails. First and last steps. Where the edge of each step is. Use tools that help you move around (mobility aids) if they are needed. These include: Canes. Walkers. Scooters. Crutches. Turn on the lights when you go into a dark area. Replace any light bulbs as soon as they burn  out. Set up your furniture so you have a clear path. Avoid moving your furniture around. If any of your floors are uneven, fix them. If there are any pets around you, be aware of where they are. Review your medicines with your doctor. Some medicines can make you feel dizzy. This can increase your chance of falling. Ask your doctor what other things that you can do to help prevent falls. This information is not intended to replace advice given to you by your health care provider. Make sure you discuss any questions you have with your health care provider. Document Released: 04/03/2009 Document Revised: 11/13/2015 Document Reviewed: 07/12/2014 Elsevier Interactive Patient Education  2017 [employer].

## 2024-03-22 NOTE — Progress Notes (Signed)
 Subjective:   [name] is a 73 y.o. female who presents for an Initial Medicare Annual Wellness Visit.  Visit Complete: Virtual I connected with  [name] on 03/22/24 by a audio enabled telemedicine application and verified that I am speaking with the correct person using two identifiers.  Patient Location: Home  Provider Location: Home Office  I discussed the limitations of evaluation and management by telemedicine. The patient expressed understanding and agreed to proceed.  Vital Signs: Because this visit was a virtual/telehealth visit, some criteria may be missing or patient reported. Any vitals not documented were not able to be obtained and vitals that have been documented are patient reported.   Cardiac Risk Factors include: advanced age (>67men, >60 women);hypertension;obesity (BMI >30kg/m2);family history of premature cardiovascular disease     Objective:    Today's Vitals   03/22/24 0907  Weight: 137 lb (62.1 kg)  Height: 5' (1.524 m)   Body mass index is 26.76 kg/m.     03/22/2024    9:05 AM 04/13/2023    7:13 AM 04/11/2023   10:46 AM 10/07/2022    2:21 PM 03/26/2022   10:34 AM 03/01/2022    8:17 AM 03/13/2016    3:27 PM  Advanced Directives  Does Patient Have a Medical Advance Directive? Yes No No Yes No Yes No   Type of Estate agent of [employer] Power of [place];Living will  Living will;Healthcare Power of Attorney   Does patient want to make changes to medical advance directive?      No - Guardian declined   Copy of Healthcare Power of Attorney in Chart? No - copy requested     No - copy requested   Would patient like information on creating a medical advance directive?  No - Patient declined No - Patient declined  No - Patient declined  No - patient declined information      Data saved with a previous flowsheet row definition    Current Medications (verified) Outpatient Encounter Medications as of 03/22/2024   Medication Sig   ABRYSVO 120 MCG/0.5ML injection    acetaminophen  (TYLENOL ) 650 MG CR tablet Take 650 mg by mouth every 8 (eight) hours as needed for pain.   amLODipine  (NORVASC ) 10 MG tablet Take 5 mg by mouth daily.   aspirin-acetaminophen -caffeine (HEADACHE RELIEF) 250-250-65 MG tablet Take 1 tablet by mouth every 8 (eight) hours as needed for headache.   Calcium  Carb-Cholecalciferol (CALCIUM  600+D) 600-20 MG-MCG TABS Take 1 tablet by mouth daily at 6 (six) AM.   celecoxib (CELEBREX) 200 MG capsule Take 200 mg by mouth daily.   cyanocobalamin (VITAMIN B12) 500 MCG tablet Take 500 mcg by mouth daily.   diclofenac Sodium (VOLTAREN) 1 % GEL Apply 1 Application topically 3 (three) times daily as needed (joint pain).   escitalopram (LEXAPRO) 10 MG tablet Take 10 mg by mouth every morning.   lisinopril  (ZESTRIL ) 20 MG tablet Take 20 mg by mouth daily.   lisinopril -hydrochlorothiazide  (ZESTORETIC ) 20-12.5 MG tablet Take 1 tablet by mouth daily.   Menthol, Topical Analgesic, (BIOFREEZE) 4 % GEL Apply 1 application  topically as needed.   metoprolol  succinate (TOPROL -XL) 100 MG 24 hr tablet Take 1 tablet (100 mg total) by mouth daily.   Multiple Vitamins-Minerals (WOMENS 50+ MULTI VITAMIN/MIN PO) Take 1 tablet by mouth daily.   Omega-3 Fatty Acids (FISH OIL) 500 MG CAPS Take 500 mg by mouth daily. Taken at [employer] and in the Afternoon (3p)  rivaroxaban  (XARELTO ) 20 MG TABS tablet TAKE 1 TABLET BY MOUTH ONCE DAILY WITH SUPPER   rosuvastatin  (CRESTOR ) 20 MG tablet Take 1 tablet (20 mg total) by mouth daily.   valACYclovir (VALTREX) 1000 MG tablet Take 1,000 mg by mouth 2 (two) times daily as needed.   Vitamin D -Vitamin K (D3 + K2) 125-100 MCG CAPS Take 1 capsule by mouth daily at 6 (six) AM.   No facility-administered encounter medications on file as of 03/22/2024.    Allergies (verified) Vicodin [hydrocodone-acetaminophen ] and Atorvastatin    History: Past Medical History:  Diagnosis Date    Arthritis    Atrial tachycardia    Atypical chest pain    Bundle branch block left    Dr. Maye is cardiologist   Elevated cholesterol    HSV-1 infection    Hypertension    Osteopenia 01/2019   T score -1.8   Pacemaker    Paroxysmal atrial fibrillation (HCC)    rrare episodes on pacemaker interrogation   Wears glasses    Past Surgical History:  Procedure Laterality Date   CARDIAC CATHETERIZATION  2005   Ejection fraction is estimated at  greater than or equal to 60 %  Wall motion is normal.   CAROTID DUPLEX BILATERAL  march 08,2005   Impression : No evidence of of signficant plaque formation or stenosis.   CARPAL TUNNEL RELEASE  04/30/2011   Procedure: CARPAL TUNNEL RELEASE;  Surgeon: Lamar LULLA Leonor Mickey., MD;  Location: [facility];  Service: Orthopedics;  Laterality: Left;  left carpal tunnel release, inject left thumb   CARPAL TUNNEL RELEASE  04/28/2012   Procedure: CARPAL TUNNEL RELEASE;  Surgeon: Lamar LULLA Leonor Mickey., MD;  Location: [facility];  Service: Orthopedics;  Laterality: Right;   CATARACT EXTRACTION W/PHACO Right 03/01/2022   Procedure: CATARACT EXTRACTION PHACO AND INTRAOCULAR LENS PLACEMENT (IOC);  Surgeon: Harrie Agent, MD;  Location: [facility];  Service: Ophthalmology;  Laterality: Right;  CDE: 3.75   CATARACT EXTRACTION W/PHACO Left 03/26/2022   Procedure: CATARACT EXTRACTION PHACO AND INTRAOCULAR LENS PLACEMENT (IOC);  Surgeon: Harrie Agent, MD;  Location: [facility];  Service: Ophthalmology;  Laterality: Left;  CDE 8.45   COLONOSCOPY     COLONOSCOPY  04/14/2012   Procedure: COLONOSCOPY;  Surgeon: Lamar CHRISTELLA Hollingshead, MD;  Location: [facility];  Service: Endoscopy;  Laterality: N/A;  8:30 AM   COLONOSCOPY WITH PROPOFOL  N/A 04/13/2023   Procedure: COLONOSCOPY WITH PROPOFOL ;  Surgeon: Hollingshead Lamar CHRISTELLA, MD;  Location: [facility];  Service: Endoscopy;  Laterality: N/A;  9:00am, asa 3   DORSAL COMPARTMENT RELEASE  04/30/2011   Procedure:  RELEASE DORSAL COMPARTMENT (DEQUERVAIN);  Surgeon: Lamar LULLA Leonor Mickey., MD;  Location: [facility];  Service: Orthopedics;  Laterality: Left;  release 1st dorsal compartment on left   INSERT / REPLACE / REMOVE PACEMAKER  07/29/2009   PPM-Dual Chamber Systems Fluoro Guidance Venogram performed 10 ml 's to the left subclavian vein   PACEMAKER INSERTION  07/2009   Dr. Justin  Pacemaker is a Adapta L implanted 07/2009   POLYPECTOMY  04/13/2023   Procedure: POLYPECTOMY;  Surgeon: Hollingshead Lamar CHRISTELLA, MD;  Location: [facility];  Service: Endoscopy;;   PPM GENERATOR CHANGEOUT N/A 10/07/2022   Procedure: PPM GENERATOR CHANGEOUT - DUAL CHAMBER;  Surgeon: Francyne Headland, MD;  Location: [facility];  Service: Cardiovascular;  Laterality: N/A;   RENAL CIRCULATION  07/ 08/ 08   normal renal duplex doppler  TRANSTHORACIC ECHOCARDIOGRAM  10/18/2012   Atrial tracking & V paced rhythm with pacer induced LBBB. No significant intra -ventricular  dyssynchrony.   TUBAL LIGATION  1986   Family History  Problem Relation Age of Onset   Heart disease Mother    Diabetes Father    Colon cancer Maternal Aunt    Heart disease Paternal Grandfather    Social History   Socioeconomic History   Marital status: Married    Spouse name: Not on file   Number of children: Not on file   Years of education: Not on file   Highest education level: Associate degree: academic program  Occupational History   Not on file  Tobacco Use   Smoking status: Former    Current packs/day: 1.50    Average packs/day: 1.5 packs/day for 30.0 years (45.0 ttl pk-yrs)    Types: Cigarettes   Smokeless tobacco: Never  Vaping Use   Vaping status: Never Used  Substance and Sexual Activity   Alcohol use: Yes    Alcohol/week: 7.0 standard drinks of alcohol    Types: 7 Glasses of wine per week   Drug use: No   Sexual activity: Not Currently    Birth control/protection: Post-menopausal, Surgical    Comment: Tubal  lig-1st intercourse 73 yo-Fewer than 5 partners  Other Topics Concern   Not on file  Social History Narrative   Not on file   Social Drivers of Health   Financial Resource Strain: Low Risk  (03/22/2024)   Overall Financial Resource Strain (CARDIA)    Difficulty of Paying Living Expenses: Not hard at all  Food Insecurity: No Food Insecurity (03/22/2024)   Hunger Vital Sign    Worried About Running Out of Food in the Last Year: Never true    Ran Out of Food in the Last Year: Never true  Transportation Needs: No Transportation Needs (03/22/2024)   PRAPARE - Administrator, Civil Service (Medical): No    Lack of Transportation (Non-Medical): No  Physical Activity: Insufficiently Active (03/22/2024)   Exercise Vital Sign    Days of Exercise per Week: 2 days    Minutes of Exercise per Session: 60 min  Stress: No Stress Concern Present (03/22/2024)   [employer] of Occupational Health - Occupational Stress Questionnaire    Feeling of Stress: Only a little  Social Connections: Socially Integrated (03/22/2024)   Social Connection and Isolation Panel    Frequency of Communication with Friends and Family: More than three times a week    Frequency of Social Gatherings with Friends and Family: Three times a week    Attends Religious Services: More than 4 times per year    Active Member of Clubs or Organizations: Yes    Attends Engineer, structural: More than 4 times per year    Marital Status: Married    Tobacco Counseling Counseling given: Not Answered   Clinical Intake:  Pre-visit preparation completed: Yes  Pain : No/denies pain     Diabetes: No  How often do you need to have someone help you when you read instructions, pamphlets, or other written materials from your doctor or pharmacy?: 1 - Never  Interpreter Needed?: No  Information entered by :: Mliss Graff LPN   Activities of Daily Living    03/22/2024    9:08 AM 04/11/2023   10:14 AM  In  your present state of health, do you have any difficulty performing the following activities:  Hearing? 0 0  Vision? 0  0  Difficulty concentrating or making decisions? 0 0  Walking or climbing stairs? 0   Dressing or bathing? 0   Doing errands, shopping? 0 0  Preparing Food and eating ? N   Using the Toilet? N   In the past six months, have you accidently leaked urine? Y   Do you have problems with loss of bowel control? N   Managing your Medications? N   Managing your Finances? N   Housekeeping or managing your Housekeeping? N     Patient Care Team: Duanne Butler DASEN, MD as PCP - General (Family Medicine) Croitoru, Jerel, MD as PCP - Cardiology (Cardiology) Croitoru, Jerel, MD as Consulting Physician (Cardiology)  Indicate any recent Medical Services you may have received from other than [facility] providers in the past year (date may be approximate).     Assessment:   This is a routine wellness examination for [name].  Hearing/Vision screen Hearing Screening - Comments:: No trouble hearing Vision Screening - Comments:: [facility] Up to date   Goals Addressed             This Visit's Progress    Patient Stated       Continue Current lifestyle       Depression Screen    03/22/2024    9:10 AM 01/24/2024    9:36 AM  PHQ 2/9 Scores  PHQ - 2 Score 0 0  PHQ- 9 Score 2     Fall Risk    03/22/2024    9:06 AM 01/24/2024    9:36 AM 01/04/2023    9:40 AM  Fall Risk   Falls in the past year? 0 0 0  Number falls in past yr: 0 0 0  Injury with Fall? 0 0 0  Risk for fall due to :  No Fall Risks No Fall Risks  Follow up Falls evaluation completed;Education provided;Falls prevention discussed Falls evaluation completed Falls evaluation completed    MEDICARE RISK AT HOME: Medicare Risk at Home Any stairs in or around the home?: No If so, are there any without handrails?: No Home free of loose throw rugs in walkways, pet beds, electrical cords, etc?: Yes Adequate lighting  in your home to reduce risk of falls?: Yes Life alert?: No Use of a cane, walker or w/c?: No Grab bars in the bathroom?: No Shower chair or bench in shower?: No Elevated toilet seat or a handicapped toilet?: No  TIMED UP AND GO:  Was the test performed? No    Cognitive Function:        03/22/2024    9:07 AM  6CIT Screen  What Year? 0 points  What month? 0 points  What time? 0 points  Count back from 20 0 points  Months in reverse 0 points  Repeat phrase 0 points  Total Score 0 points    Immunizations Immunization History  Administered Date(s) Administered   INFLUENZA, HIGH DOSE SEASONAL PF 03/14/2019   Influenza-Unspecified 03/25/2023   Moderna Sars-Covid-2 Vaccination 03/06/2020    TDAP status: Up to date  Flu Vaccine status: Due, Education has been provided regarding the importance of this vaccine. Advised may receive this vaccine at local pharmacy or Health Dept. Aware to provide a copy of the vaccination record if obtained from local pharmacy or Health Dept. Verbalized acceptance and understanding.  Pneumococcal vaccine status: Up to date  Covid-19 vaccine status: Information provided on how to obtain vaccines.   Qualifies for Shingles Vaccine? No   Zostavax completed  per patient  Shingrix Completed?: No.    Education has been provided regarding the importance of this vaccine. Patient has been advised to call insurance company to determine out of pocket expense if they have not yet received this vaccine. Advised may also receive vaccine at local pharmacy or Health Dept. Verbalized acceptance and understanding.  Screening Tests Health Maintenance  Topic Date Due   COVID-19 Vaccine (2 - Moderna risk series) 04/03/2020   Zoster Vaccines- Shingrix (1 of 2) 04/25/2024 (Originally 12/08/1969)   Influenza Vaccine  09/18/2024 (Originally 01/20/2024)   Lung Cancer Screening  01/23/2025 (Originally 03/13/2017)   DTaP/Tdap/Td (1 - Tdap) 01/23/2025 (Originally 12/08/1969)    Pneumococcal Vaccine: 50+ Years (1 of 2 - PCV) 01/23/2025 (Originally 12/08/1969)   Hepatitis C Screening  01/23/2025 (Originally 12/09/1968)   Medicare Annual Wellness (AWV)  03/22/2025   Mammogram  05/01/2025   Colonoscopy  04/12/2033   DEXA SCAN  Completed   HPV VACCINES  Aged Out   Meningococcal B Vaccine  Aged Out    Health Maintenance  Health Maintenance Due  Topic Date Due   COVID-19 Vaccine (2 - Moderna risk series) 04/03/2020    Colorectal cancer screening: No longer required.   Mammogram status: Completed  . Repeat every year  Bone Density status: Completed 2024. Results reflect: Bone density results: OSTEOPENIA. Repeat every 2 years.  Lung Cancer Screening: (Low Dose CT Chest recommended if Age 53-80 years, 20 pack-year currently smoking OR have quit w/in 15years.) does not qualify.   Lung Cancer Screening Referral:   Additional Screening:  Hepatitis C Screening   Never done  Vision Screening: Recommended annual ophthalmology exams for early detection of glaucoma and other disorders of the eye. Is the patient up to date with their annual eye exam?  Yes  Who is the provider or what is the name of the office in which the patient attends annual eye exams? [facility] If pt is not established with a provider, would they like to be referred to a provider to establish care? No .   Dental Screening: Recommended annual dental exams for proper oral hygiene    Community Resource Referral / Chronic Care Management: CRR required this visit?  No   CCM required this visit?  No     Plan:     I have personally reviewed and noted the following in the patient's chart:   Medical and social history Use of alcohol, tobacco or illicit drugs  Current medications and supplements including opioid prescriptions. Patient is not currently taking opioid prescriptions. Functional ability and status Nutritional status Physical activity Advanced directives List of other  physicians Hospitalizations, surgeries, and ER visits in previous 12 months Vitals Screenings to include cognitive, depression, and falls Referrals and appointments  In addition, I have reviewed and discussed with patient certain preventive protocols, quality metrics, and best practice recommendations. A written personalized care plan for preventive services as well as general preventive health recommendations were provided to patient.     Mliss Graff, LPN   [id number]/12/7972   After Visit Summary: (MyChart) Due to this being a telephonic visit, the after visit summary with patients personalized plan was offered to patient via MyChart   Nurse Notes:

## 2024-03-23 ENCOUNTER — Encounter: Payer: Self-pay | Admitting: Family Medicine

## 2024-03-23 ENCOUNTER — Ambulatory Visit: Admitting: Family Medicine

## 2024-03-23 ENCOUNTER — Other Ambulatory Visit: Payer: Self-pay

## 2024-03-23 ENCOUNTER — Telehealth: Payer: Self-pay

## 2024-03-23 ENCOUNTER — Other Ambulatory Visit (HOSPITAL_COMMUNITY): Payer: Self-pay | Admitting: Family Medicine

## 2024-03-23 VITALS — BP 126/82 | HR 60 | Temp 97.7°F | Ht 60.0 in | Wt 136.0 lb

## 2024-03-23 DIAGNOSIS — L600 Ingrowing nail: Secondary | ICD-10-CM

## 2024-03-23 DIAGNOSIS — Z23 Encounter for immunization: Secondary | ICD-10-CM | POA: Diagnosis not present

## 2024-03-23 DIAGNOSIS — Z1231 Encounter for screening mammogram for malignant neoplasm of breast: Secondary | ICD-10-CM

## 2024-03-23 DIAGNOSIS — I10 Essential (primary) hypertension: Secondary | ICD-10-CM

## 2024-03-23 MED ORDER — LISINOPRIL-HYDROCHLOROTHIAZIDE 20-12.5 MG PO TABS: 1.0000 | ORAL_TABLET | Freq: Every day | ORAL | 1 refills | Status: AC

## 2024-03-23 NOTE — Telephone Encounter (Signed)
 Prescription Request  03/23/2024  LOV: 03/23/24  What is the name of the medication or equipment? lisinopril  (ZESTRIL ) 20 MG tablet [[id number]]   Have you contacted your pharmacy to request a refill? Yes   Which pharmacy would you like this sent to?  [facility] [address], [place] - [address] [place] [address] Phone: [phone] Fax: [phone]    Patient notified that their request is being sent to the clinical staff for review and that they should receive a response within 2 business days.   Please advise at [facility] [phone] .

## 2024-03-23 NOTE — Progress Notes (Signed)
 Remote PPM Transmission

## 2024-03-23 NOTE — Progress Notes (Signed)
 Subjective:    Patient ID: [name], female    DOB: [birth year], 73 y.o.   MRN: [id number]  HPI Patient reports tenderness and pain in her left great toe.  The medial portion of the toenail curled under and grows into the surrounding skin.  This is tender and painful in this area.  She has been putting up with this for quite some time. Past Medical History:  Diagnosis Date   Arthritis    Atrial tachycardia    Atypical chest pain    Bundle branch block left    Dr. Maye is cardiologist   Elevated cholesterol    HSV-1 infection    Hypertension    Osteopenia 01/2019   T score -1.8   Pacemaker    Paroxysmal atrial fibrillation (HCC)    rrare episodes on pacemaker interrogation   Wears glasses    Past Surgical History:  Procedure Laterality Date   CARDIAC CATHETERIZATION  2005   Ejection fraction is estimated at  greater than or equal to 60 %  Wall motion is normal.   CAROTID DUPLEX BILATERAL  march 08,2005   Impression : No evidence of of signficant plaque formation or stenosis.   CARPAL TUNNEL RELEASE  04/30/2011   Procedure: CARPAL TUNNEL RELEASE;  Surgeon: Lamar LULLA Leonor Mickey., MD;  Location: [facility];  Service: Orthopedics;  Laterality: Left;  left carpal tunnel release, inject left thumb   CARPAL TUNNEL RELEASE  04/28/2012   Procedure: CARPAL TUNNEL RELEASE;  Surgeon: Lamar LULLA Leonor Mickey., MD;  Location: [facility];  Service: Orthopedics;  Laterality: Right;   CATARACT EXTRACTION W/PHACO Right 03/01/2022   Procedure: CATARACT EXTRACTION PHACO AND INTRAOCULAR LENS PLACEMENT (IOC);  Surgeon: Harrie Agent, MD;  Location: [facility];  Service: Ophthalmology;  Laterality: Right;  CDE: 3.75   CATARACT EXTRACTION W/PHACO Left 03/26/2022   Procedure: CATARACT EXTRACTION PHACO AND INTRAOCULAR LENS PLACEMENT (IOC);  Surgeon: Harrie Agent, MD;  Location: [facility];  Service: Ophthalmology;  Laterality: Left;  CDE 8.45   COLONOSCOPY     COLONOSCOPY   04/14/2012   Procedure: COLONOSCOPY;  Surgeon: Lamar CHRISTELLA Hollingshead, MD;  [facility];  Service: Endoscopy;  Laterality: N/A;  8:30 AM   COLONOSCOPY WITH PROPOFOL  N/A 04/13/2023   Procedure: COLONOSCOPY WITH PROPOFOL ;  Surgeon: Hollingshead Lamar CHRISTELLA, MD;  [facility];  Service: Endoscopy;  Laterality: N/A;  9:00am, asa 3   DORSAL COMPARTMENT RELEASE  04/30/2011   Procedure: RELEASE DORSAL COMPARTMENT (DEQUERVAIN);  Surgeon: Lamar LULLA Leonor Mickey., MD;  Location: [facility];  Service: Orthopedics;  Laterality: Left;  release 1st dorsal compartment on left   INSERT / REPLACE / REMOVE PACEMAKER  07/29/2009   PPM-Dual Chamber Systems Fluoro Guidance Venogram performed 10 ml 's to the left subclavian vein   PACEMAKER INSERTION  07/2009   Dr. Justin  Pacemaker is a Adapta L implanted 07/2009   POLYPECTOMY  04/13/2023   Procedure: POLYPECTOMY;  Surgeon: Hollingshead Lamar CHRISTELLA, MD;  [facility];  Service: Endoscopy;;   PPM GENERATOR CHANGEOUT N/A 10/07/2022   Procedure: PPM GENERATOR CHANGEOUT - DUAL CHAMBER;  Surgeon: Francyne Headland, MD;  [facility]: [facility];  Service: Cardiovascular;  Laterality: N/A;   RENAL CIRCULATION  07/ 08/ 08   normal renal duplex doppler    TRANSTHORACIC ECHOCARDIOGRAM  10/18/2012   Atrial tracking & V paced rhythm with pacer induced LBBB. No significant intra -ventricular  dyssynchrony.  TUBAL LIGATION  1986   Current Outpatient Medications on File Prior to Visit  Medication Sig Dispense Refill   ABRYSVO 120 MCG/0.5ML injection      acetaminophen  (TYLENOL ) 650 MG CR tablet Take 650 mg by mouth every 8 (eight) hours as needed for pain.     amLODipine  (NORVASC ) 10 MG tablet Take 5 mg by mouth daily.     aspirin-acetaminophen -caffeine (HEADACHE RELIEF) 250-250-65 MG tablet Take 1 tablet by mouth every 8 (eight) hours as needed for headache.     Calcium  Carb-Cholecalciferol (CALCIUM  600+D) 600-20 MG-MCG TABS Take 1 tablet by mouth  daily at 6 (six) AM.     cyanocobalamin (VITAMIN B12) 500 MCG tablet Take 500 mcg by mouth daily.     diclofenac Sodium (VOLTAREN) 1 % GEL Apply 1 Application topically 3 (three) times daily as needed (joint pain).     escitalopram (LEXAPRO) 10 MG tablet Take 10 mg by mouth every morning.     lisinopril  (ZESTRIL ) 20 MG tablet Take 20 mg by mouth daily.     lisinopril -hydrochlorothiazide  (ZESTORETIC ) 20-12.5 MG tablet Take 1 tablet by mouth daily. 90 tablet 1   Menthol, Topical Analgesic, (BIOFREEZE) 4 % GEL Apply 1 application  topically as needed.     metoprolol  succinate (TOPROL -XL) 100 MG 24 hr tablet Take 1 tablet (100 mg total) by mouth daily. 90 tablet 3   Multiple Vitamins-Minerals (WOMENS 50+ MULTI VITAMIN/MIN PO) Take 1 tablet by mouth daily.     Omega-3 Fatty Acids (FISH OIL) 500 MG CAPS Take 500 mg by mouth daily. Taken at [employer] and in the Afternoon (3p)     rivaroxaban  (XARELTO ) 20 MG TABS tablet TAKE 1 TABLET BY MOUTH ONCE DAILY WITH SUPPER 90 tablet 1   rosuvastatin  (CRESTOR ) 20 MG tablet Take 1 tablet (20 mg total) by mouth daily. 90 tablet 3   valACYclovir (VALTREX) 1000 MG tablet Take 1,000 mg by mouth 2 (two) times daily as needed.     Vitamin D -Vitamin K (D3 + K2) 125-100 MCG CAPS Take 1 capsule by mouth daily at 6 (six) AM.     No current facility-administered medications on file prior to visit.   Allergies  Allergen Reactions   Vicodin [Hydrocodone-Acetaminophen ] Nausea Only   Atorvastatin  Other (See Comments)    Muscle/joint pain  atorvastatin    Social History   Socioeconomic History   Marital status: Married    Spouse name: Not on file   Number of children: Not on file   Years of education: Not on file   Highest education level: Associate degree: academic program  Occupational History   Not on file  Tobacco Use   Smoking status: Former    Current packs/day: 1.50    Average packs/day: 1.5 packs/day for 30.0 years (45.0 ttl pk-yrs)    Types: Cigarettes    Smokeless tobacco: Never  Vaping Use   Vaping status: Never Used  Substance and Sexual Activity   Alcohol use: Yes    Alcohol/week: 7.0 standard drinks of alcohol    Types: 7 Glasses of wine per week   Drug use: No   Sexual activity: Not Currently    Birth control/protection: Post-menopausal, Surgical    Comment: Tubal lig-1st intercourse 73 yo-Fewer than 5 partners  Other Topics Concern   Not on file  Social History Narrative   Not on file   Social Drivers of Health   Financial Resource Strain: Low Risk  (03/22/2024)   Overall Financial Resource Strain (CARDIA)  Difficulty of Paying Living Expenses: Not hard at all  Food Insecurity: No Food Insecurity (03/22/2024)   Hunger Vital Sign    Worried About Running Out of Food in the Last Year: Never true    Ran Out of Food in the Last Year: Never true  Transportation Needs: No Transportation Needs (03/22/2024)   PRAPARE - Administrator, Civil Service (Medical): No    Lack of Transportation (Non-Medical): No  Physical Activity: Insufficiently Active (03/22/2024)   Exercise Vital Sign    Days of Exercise per Week: 2 days    Minutes of Exercise per Session: 60 min  Stress: No Stress Concern Present (03/22/2024)   [employer] of Occupational Health - Occupational Stress Questionnaire    Feeling of Stress: Only a little  Social Connections: Socially Integrated (03/22/2024)   Social Connection and Isolation Panel    Frequency of Communication with Friends and Family: More than three times a week    Frequency of Social Gatherings with Friends and Family: Three times a week    Attends Religious Services: More than 4 times per year    Active Member of Clubs or Organizations: Yes    Attends Banker Meetings: More than 4 times per year    Marital Status: Married  Catering manager Violence: Not At Risk (03/22/2024)   Humiliation, Afraid, Rape, and Kick questionnaire    Fear of Current or Ex-Partner: No     Emotionally Abused: No    Physically Abused: No    Sexually Abused: No   Family History  Problem Relation Age of Onset   Heart disease Mother    Diabetes Father    Colon cancer Maternal Aunt    Heart disease Paternal Grandfather      Review of Systems     Objective:   Physical Exam Vitals reviewed.  Constitutional:      Appearance: Normal appearance. She is normal weight.  HENT:     Mouth/Throat:     Pharynx: No oropharyngeal exudate or posterior oropharyngeal erythema.  Eyes:     Extraocular Movements: Extraocular movements intact.     Pupils: Pupils are equal, round, and reactive to light.  Neck:     Vascular: No carotid bruit.  Cardiovascular:     Rate and Rhythm: Normal rate and regular rhythm.     Heart sounds: Normal heart sounds. No murmur heard.    No gallop.  Pulmonary:     Effort: Pulmonary effort is normal. No respiratory distress.     Breath sounds: Normal breath sounds. No wheezing, rhonchi or rales.  Abdominal:     General: Bowel sounds are normal. There is no distension.     Palpations: Abdomen is soft. There is no mass.     Tenderness: There is no abdominal tenderness.  Musculoskeletal:     Right lower leg: No edema.     Left lower leg: No edema.  Lymphadenopathy:     Cervical: No cervical adenopathy.  Neurological:     General: No focal deficit present.     Mental Status: She is alert and oriented to person, place, and time. Mental status is at baseline.     Cranial Nerves: No cranial nerve deficit.     Motor: No weakness.     Coordination: Coordination normal.     Gait: Gait normal.  Psychiatric:        Mood and Affect: Mood normal.        Behavior: Behavior normal.  Ingrown medial  toenail of the left great toe       Assessment & Plan:   Ingrown toenail - Plan: Ambulatory referral to Podiatry  Flu vaccine need - Plan: Flu vaccine HIGH DOSE PF(Fluzone Trivalent) Offered to remove the ingrown portion of the nail today.  Patient  would like to see a podiatrist.  I will happily make that referral for her.  Recheck cholesterol in 3 months.  Goal LDL cholesterol is less than 55 given her elevated coronary artery calcium  score

## 2024-04-03 ENCOUNTER — Ambulatory Visit (INDEPENDENT_AMBULATORY_CARE_PROVIDER_SITE_OTHER): Admitting: Podiatry

## 2024-04-03 ENCOUNTER — Encounter: Payer: Self-pay | Admitting: Podiatry

## 2024-04-03 DIAGNOSIS — L600 Ingrowing nail: Secondary | ICD-10-CM

## 2024-04-03 MED ORDER — NEOMYCIN-POLYMYXIN-HC 1 % OT SOLN: OTIC | 1 refills | Status: AC

## 2024-04-03 NOTE — Patient Instructions (Signed)

## 2024-04-03 NOTE — Progress Notes (Signed)
 Subjective:  Patient ID: [name], female    DOB: 21-Feb-1951,  MRN: [id number] HPI Chief Complaint  Patient presents with   Toe Pain    Hallux left - medial border, ingrown x few months, shoes cause pressure    New Patient (Initial Visit)    Est pt 2017    73 y.o. female presents with the above complaint.   ROS: Denies fever chills nausea vomit muscle aches pains calf pain back pain chest pain shortness of breath.  Past Medical History:  Diagnosis Date   Arthritis    Atrial tachycardia    Atypical chest pain    Bundle branch block left    Dr. Maye is cardiologist   Elevated cholesterol    HSV-1 infection    Hypertension    Osteopenia 01/2019   T score -1.8   Pacemaker    Paroxysmal atrial fibrillation (HCC)    rrare episodes on pacemaker interrogation   Wears glasses    Past Surgical History:  Procedure Laterality Date   CARDIAC CATHETERIZATION  2005   Ejection fraction is estimated at  greater than or equal to 60 %  Wall motion is normal.   CAROTID DUPLEX BILATERAL  march 08,2005   Impression : No evidence of of signficant plaque formation or stenosis.   CARPAL TUNNEL RELEASE  04/30/2011   Procedure: CARPAL TUNNEL RELEASE;  Surgeon: Lamar LULLA Leonor Mickey., MD;  Location: [facility];  Service: Orthopedics;  Laterality: Left;  left carpal tunnel release, inject left thumb   CARPAL TUNNEL RELEASE  04/28/2012   Procedure: CARPAL TUNNEL RELEASE;  Surgeon: Lamar LULLA Leonor Mickey., MD;  Location: [facility];  Service: Orthopedics;  Laterality: Right;   CATARACT EXTRACTION W/PHACO Right 03/01/2022   Procedure: CATARACT EXTRACTION PHACO AND INTRAOCULAR LENS PLACEMENT (IOC);  Surgeon: Harrie Agent, MD;  Location: [facility];  Service: Ophthalmology;  Laterality: Right;  CDE: 3.75   CATARACT EXTRACTION W/PHACO Left 03/26/2022   Procedure: CATARACT EXTRACTION PHACO AND INTRAOCULAR LENS PLACEMENT (IOC);  Surgeon: Harrie Agent, MD;  Location: [facility];   Service: Ophthalmology;  Laterality: Left;  CDE 8.45   COLONOSCOPY     COLONOSCOPY  04/14/2012   Procedure: COLONOSCOPY;  Surgeon: Lamar CHRISTELLA Hollingshead, MD;  [facility];  Service: Endoscopy;  Laterality: N/A;  8:30 AM   COLONOSCOPY WITH PROPOFOL  N/A 04/13/2023   Procedure: COLONOSCOPY WITH PROPOFOL ;  Surgeon: Hollingshead Lamar CHRISTELLA, MD;  [facility];  Service: Endoscopy;  Laterality: N/A;  9:00am, asa 3   DORSAL COMPARTMENT RELEASE  04/30/2011   Procedure: RELEASE DORSAL COMPARTMENT (DEQUERVAIN);  Surgeon: Lamar LULLA Leonor Mickey., MD;  Location: [facility];  Service: Orthopedics;  Laterality: Left;  release 1st dorsal compartment on left   INSERT / REPLACE / REMOVE PACEMAKER  07/29/2009   PPM-Dual Chamber Systems Fluoro Guidance Venogram performed 10 ml 's to the left subclavian vein   PACEMAKER INSERTION  07/2009   Dr. Justin  Pacemaker is a Adapta L implanted 07/2009   POLYPECTOMY  04/13/2023   Procedure: POLYPECTOMY;  Surgeon: Hollingshead Lamar CHRISTELLA, MD;  [facility];  Service: Endoscopy;;   PPM GENERATOR CHANGEOUT N/A 10/07/2022   Procedure: PPM GENERATOR CHANGEOUT - DUAL CHAMBER;  Surgeon: Francyne Headland, MD;  [facility];  Service: Cardiovascular;  Laterality: N/A;   RENAL CIRCULATION  07/ 08/ 08   normal renal duplex doppler    TRANSTHORACIC ECHOCARDIOGRAM  10/18/2012  Atrial tracking & V paced rhythm with pacer induced LBBB. No significant intra -ventricular  dyssynchrony.   TUBAL LIGATION  1986    Current Outpatient Medications:    NEOMYCIN-POLYMYXIN-HYDROCORTISONE (CORTISPORIN) 1 % SOLN OTIC solution, Apply 1-2 drops to toe BID after soaking, Disp: 10 mL, Rfl: 1   ABRYSVO 120 MCG/0.5ML injection, , Disp: , Rfl:    acetaminophen  (TYLENOL ) 650 MG CR tablet, Take 650 mg by mouth every 8 (eight) hours as needed for pain., Disp: , Rfl:    amLODipine  (NORVASC ) 10 MG tablet, Take 5 mg by mouth daily., Disp: , Rfl:     aspirin-acetaminophen -caffeine (HEADACHE RELIEF) 250-250-65 MG tablet, Take 1 tablet by mouth every 8 (eight) hours as needed for headache., Disp: , Rfl:    Calcium  Carb-Cholecalciferol (CALCIUM  600+D) 600-20 MG-MCG TABS, Take 1 tablet by mouth daily at 6 (six) AM., Disp: , Rfl:    cyanocobalamin (VITAMIN B12) 500 MCG tablet, Take 500 mcg by mouth daily., Disp: , Rfl:    diclofenac Sodium (VOLTAREN) 1 % GEL, Apply 1 Application topically 3 (three) times daily as needed (joint pain)., Disp: , Rfl:    escitalopram (LEXAPRO) 10 MG tablet, Take 10 mg by mouth every morning., Disp: , Rfl:    lisinopril  (ZESTRIL ) 20 MG tablet, Take 20 mg by mouth daily., Disp: , Rfl:    lisinopril -hydrochlorothiazide  (ZESTORETIC ) 20-12.5 MG tablet, Take 1 tablet by mouth daily., Disp: 90 tablet, Rfl: 1   Menthol, Topical Analgesic, (BIOFREEZE) 4 % GEL, Apply 1 application  topically as needed., Disp: , Rfl:    metoprolol  succinate (TOPROL -XL) 100 MG 24 hr tablet, Take 1 tablet (100 mg total) by mouth daily., Disp: 90 tablet, Rfl: 3   Multiple Vitamins-Minerals (WOMENS 50+ MULTI VITAMIN/MIN PO), Take 1 tablet by mouth daily., Disp: , Rfl:    Omega-3 Fatty Acids (FISH OIL) 500 MG CAPS, Take 500 mg by mouth daily. Taken at [employer] and in the Afternoon (3p), Disp: , Rfl:    rivaroxaban  (XARELTO ) 20 MG TABS tablet, TAKE 1 TABLET BY MOUTH ONCE DAILY WITH SUPPER, Disp: 90 tablet, Rfl: 1   rosuvastatin  (CRESTOR ) 20 MG tablet, Take 1 tablet (20 mg total) by mouth daily., Disp: 90 tablet, Rfl: 3   valACYclovir (VALTREX) 1000 MG tablet, Take 1,000 mg by mouth 2 (two) times daily as needed., Disp: , Rfl:    Vitamin D -Vitamin K (D3 + K2) 125-100 MCG CAPS, Take 1 capsule by mouth daily at 6 (six) AM., Disp: , Rfl:   Allergies  Allergen Reactions   Vicodin [Hydrocodone-Acetaminophen ] Nausea Only   Atorvastatin  Other (See Comments)    Muscle/joint pain  atorvastatin    Review of Systems Objective:  There were no vitals filed for  this visit.  General: Well developed, nourished, in no acute distress, alert and oriented x3   Dermatological: Skin is warm, dry and supple bilateral. Nails x 10 are well maintained; remaining integument appears unremarkable at this time. There are no open sores, no preulcerative lesions, no rash or signs of infection present.  Sharp incurvated nail margin along the medial border of the left hallux.  There is mild erythema mild tenderness on palpation no granulation tissue no purulence no malodor.  Vascular: Dorsalis Pedis artery and Posterior Tibial artery pedal pulses are 2/4 bilateral with immedate capillary fill time. Pedal hair growth present. No varicosities and no lower extremity edema present bilateral.   Neruologic: Grossly intact via light touch bilateral. Vibratory intact via tuning fork bilateral. Protective threshold with Semmes  Wienstein monofilament intact to all pedal sites bilateral. Patellar and Achilles deep tendon reflexes 2+ bilateral. No Babinski or clonus noted bilateral.   Musculoskeletal: No gross boney pedal deformities bilateral. No pain, crepitus, or limitation noted with foot and ankle range of motion bilateral. Muscular strength 5/5 in all groups tested bilateral.  Gait: Unassisted, Nonantalgic.    Radiographs:  None taken  Assessment & Plan:   Assessment: Ingrown toenail medial border hallux left.    Plan: Chemical matricectomy was performed today after local anesthetic was administered.  She tolerated the procedure well.  The nail was split along the medial border from distal to proximal avulsed exposing the matrix.  3 applications of phenol were applied to the matrix and 32nd intervals and then neutralized with isopropyl alcohol.  Xeroform gauze Telfa pad and dry sterile compressive dressing was applied she was given both oral written home-going stretching care and soaking of the toe as well as a prescription for Cortisporin Otic and I will follow-up with her  in 2 to 3 weeks.  Questions or concerns she will notify the office.     Cedrica Brune T. [place], [place]

## 2024-04-10 ENCOUNTER — Ambulatory Visit: Payer: Medicare HMO

## 2024-04-10 DIAGNOSIS — I442 Atrioventricular block, complete: Secondary | ICD-10-CM

## 2024-04-11 LAB — CUP PACEART REMOTE DEVICE CHECK
Battery Remaining Longevity: 133 mo
Battery Voltage: 3.02 V
Brady Statistic AP VP Percent: 6.84 %
Brady Statistic AP VS Percent: 0 %
Brady Statistic AS VP Percent: 92.99 %
Brady Statistic AS VS Percent: 0.18 %
Brady Statistic RA Percent Paced: 6.83 %
Brady Statistic RV Percent Paced: 99.82 %
Date Time Interrogation Session: 20251021004505
Implantable Lead Connection Status: 753985
Implantable Lead Connection Status: 753985
Implantable Lead Implant Date: 20110208
Implantable Lead Implant Date: 20110208
Implantable Lead Location: 753859
Implantable Lead Location: 753860
Implantable Lead Model: 5076
Implantable Lead Model: 5076
Implantable Pulse Generator Implant Date: 20240418
Lead Channel Impedance Value: 342 Ohm
Lead Channel Impedance Value: 361 Ohm
Lead Channel Impedance Value: 399 Ohm
Lead Channel Impedance Value: 456 Ohm
Lead Channel Pacing Threshold Amplitude: 0.5 V
Lead Channel Pacing Threshold Amplitude: 1 V
Lead Channel Pacing Threshold Pulse Width: 0.4 ms
Lead Channel Pacing Threshold Pulse Width: 0.4 ms
Lead Channel Sensing Intrinsic Amplitude: 1.875 mV
Lead Channel Sensing Intrinsic Amplitude: 1.875 mV
Lead Channel Setting Pacing Amplitude: 1.5 V
Lead Channel Setting Pacing Amplitude: 2 V
Lead Channel Setting Pacing Pulse Width: 0.4 ms
Lead Channel Setting Sensing Sensitivity: 1.2 mV
Zone Setting Status: 755011

## 2024-04-13 NOTE — Progress Notes (Signed)
 Remote PPM Transmission

## 2024-04-16 ENCOUNTER — Ambulatory Visit: Payer: Self-pay | Admitting: Cardiovascular Disease

## 2024-04-17 ENCOUNTER — Encounter: Payer: Self-pay | Admitting: Podiatry

## 2024-04-17 ENCOUNTER — Ambulatory Visit: Admitting: Podiatry

## 2024-04-17 DIAGNOSIS — L600 Ingrowing nail: Secondary | ICD-10-CM

## 2024-04-17 DIAGNOSIS — Z9889 Other specified postprocedural states: Secondary | ICD-10-CM

## 2024-04-17 NOTE — Progress Notes (Signed)
 She presents today for follow-up of her matrixectomy ingrown nail hallux left.  She states that is doing just great she denies fever chills nausea mobic muscle aches and pains.  States that is healing very well.  Objective: Vital signs are stable alert oriented x 3 there is no erythema edema cellulitis drainage odor along the tibial border there is just simply scab present.  No purulence no malodor.  Assessment: Well-healing surgical foot hallux left.  Plan: I recommended she continue to soak Epsom salts and warm water  every other day at least for the next 2 weeks and then discontinue.  I will follow-up with her as needed if necessary

## 2024-05-04 ENCOUNTER — Ambulatory Visit (HOSPITAL_COMMUNITY)
Admission: RE | Admit: 2024-05-04 | Discharge: 2024-05-04 | Disposition: A | Discharge status: Home / Self Care | Source: Ambulatory Visit | Attending: Family Medicine | Admitting: Family Medicine

## 2024-05-04 DIAGNOSIS — Z1231 Encounter for screening mammogram for malignant neoplasm of breast: Secondary | ICD-10-CM | POA: Insufficient documentation

## 2024-05-17 ENCOUNTER — Ambulatory Visit: Attending: Family Medicine

## 2024-05-28 DIAGNOSIS — G8929 Other chronic pain: Secondary | ICD-10-CM | POA: Diagnosis not present

## 2024-05-28 DIAGNOSIS — M25561 Pain in right knee: Secondary | ICD-10-CM | POA: Diagnosis not present

## 2024-06-04 ENCOUNTER — Other Ambulatory Visit (HOSPITAL_COMMUNITY): Payer: Self-pay | Admitting: Sports Medicine

## 2024-06-04 DIAGNOSIS — M25561 Pain in right knee: Secondary | ICD-10-CM

## 2024-06-05 ENCOUNTER — Telehealth: Payer: Self-pay

## 2024-06-05 ENCOUNTER — Ambulatory Visit (INDEPENDENT_AMBULATORY_CARE_PROVIDER_SITE_OTHER): Admitting: Family Medicine

## 2024-06-05 ENCOUNTER — Other Ambulatory Visit: Payer: Self-pay | Admitting: Family Medicine

## 2024-06-05 ENCOUNTER — Encounter: Payer: Self-pay | Admitting: Family Medicine

## 2024-06-05 VITALS — BP 120/60 | HR 75 | Temp 98.5°F | Ht 60.0 in | Wt 134.6 lb

## 2024-06-05 DIAGNOSIS — B9689 Other specified bacterial agents as the cause of diseases classified elsewhere: Secondary | ICD-10-CM

## 2024-06-05 DIAGNOSIS — J019 Acute sinusitis, unspecified: Secondary | ICD-10-CM

## 2024-06-05 MED ORDER — AMOXICILLIN 875 MG PO TABS: 875.0000 mg | ORAL_TABLET | Freq: Two times a day (BID) | ORAL | 0 refills | Status: AC

## 2024-06-05 MED ORDER — GUAIFENESIN-CODEINE 100-10 MG/5ML PO SOLN: 5.0000 mL | Freq: Three times a day (TID) | ORAL | 0 refills | Status: AC | PRN

## 2024-06-05 MED ORDER — GUAIFENESIN-CODEINE 200-10 MG/5ML PO LIQD: 5.0000 mL | Freq: Four times a day (QID) | ORAL | 0 refills | Status: DC | PRN

## 2024-06-05 NOTE — Progress Notes (Signed)
 Subjective:    Patient ID: [name], female    DOB: July 18, 1950, 73 y.o.   MRN: [id number]  HPI Patient got sick starting Wednesday of last week.  Symptoms include head congestion.  For the last 2 days she has had severe pain in her left maxillary sinus and pain in her left upper teeth.  They are aching and throbbing.  She believes she has a sinus infection.  She also has postnasal drip and a constant cough.  She denies any fevers or chills.  She denies any nausea or vomiting.  She denies any chest pain or pleurisy. Past Medical History:  Diagnosis Date   Arthritis    Atrial tachycardia    Atypical chest pain    Bundle branch block left    Dr. Maye is cardiologist   Elevated cholesterol    HSV-1 infection    Hypertension    Osteopenia 01/2019   T score -1.8   Pacemaker    Paroxysmal atrial fibrillation (HCC)    rrare episodes on pacemaker interrogation   Wears glasses    Past Surgical History:  Procedure Laterality Date   CARDIAC CATHETERIZATION  2005   Ejection fraction is estimated at  greater than or equal to 60 %  Wall motion is normal.   CAROTID DUPLEX BILATERAL  march 08,2005   Impression : No evidence of of signficant plaque formation or stenosis.   CARPAL TUNNEL RELEASE  04/30/2011   Procedure: CARPAL TUNNEL RELEASE;  Surgeon: Lamar LULLA Leonor Mickey., MD;  Location: [facility];  Service: Orthopedics;  Laterality: Left;  left carpal tunnel release, inject left thumb   CARPAL TUNNEL RELEASE  04/28/2012   Procedure: CARPAL TUNNEL RELEASE;  Surgeon: Lamar LULLA Leonor Mickey., MD;  Location: [facility];  Service: Orthopedics;  Laterality: Right;   CATARACT EXTRACTION W/PHACO Right 03/01/2022   Procedure: CATARACT EXTRACTION PHACO AND INTRAOCULAR LENS PLACEMENT (IOC);  Surgeon: Harrie Agent, MD;  Location: [facility];  Service: Ophthalmology;  Laterality: Right;  CDE: 3.75   CATARACT EXTRACTION W/PHACO Left 03/26/2022   Procedure: CATARACT EXTRACTION  PHACO AND INTRAOCULAR LENS PLACEMENT (IOC);  Surgeon: Harrie Agent, MD;  Location: [facility];  Service: Ophthalmology;  Laterality: Left;  CDE 8.45   COLONOSCOPY     COLONOSCOPY  04/14/2012   Procedure: COLONOSCOPY;  Surgeon: Lamar CHRISTELLA Hollingshead, MD;  Location: [facility];  Service: Endoscopy;  Laterality: N/A;  8:30 AM   COLONOSCOPY WITH PROPOFOL  N/A 04/13/2023   Procedure: COLONOSCOPY WITH PROPOFOL ;  Surgeon: Hollingshead Lamar CHRISTELLA, MD;  Location: [facility];  Service: Endoscopy;  Laterality: N/A;  9:00am, asa 3   DORSAL COMPARTMENT RELEASE  04/30/2011   Procedure: RELEASE DORSAL COMPARTMENT (DEQUERVAIN);  Surgeon: Lamar LULLA Leonor Mickey., MD;  Location: [facility];  Service: Orthopedics;  Laterality: Left;  release 1st dorsal compartment on left   INSERT / REPLACE / REMOVE PACEMAKER  07/29/2009   PPM-Dual Chamber Systems Fluoro Guidance Venogram performed 10 ml 's to the left subclavian vein   PACEMAKER INSERTION  07/2009   Dr. Justin  Pacemaker is a Adapta L implanted 07/2009   POLYPECTOMY  04/13/2023   Procedure: POLYPECTOMY;  Surgeon: Hollingshead Lamar CHRISTELLA, MD;  Location: [facility];  Service: Endoscopy;;   PPM GENERATOR CHANGEOUT N/A 10/07/2022   Procedure: PPM GENERATOR CHANGEOUT - DUAL CHAMBER;  Surgeon: Francyne Headland, MD;  Location: [facility];  Service: Cardiovascular;  Laterality: N/A;   RENAL CIRCULATION  07/ 08/ 08   normal renal duplex doppler    TRANSTHORACIC ECHOCARDIOGRAM  10/18/2012   Atrial tracking & V paced rhythm with pacer induced LBBB. No significant intra -ventricular  dyssynchrony.   TUBAL LIGATION  1986   Current Outpatient Medications on File Prior to Visit  Medication Sig Dispense Refill   ABRYSVO 120 MCG/0.5ML injection      acetaminophen  (TYLENOL ) 650 MG CR tablet Take 650 mg by mouth every 8 (eight) hours as needed for pain.     amLODipine  (NORVASC ) 10 MG tablet Take 5 mg by mouth daily.     aspirin-acetaminophen -caffeine (HEADACHE RELIEF)  250-250-65 MG tablet Take 1 tablet by mouth every 8 (eight) hours as needed for headache.     Calcium  Carb-Cholecalciferol (CALCIUM  600+D) 600-20 MG-MCG TABS Take 1 tablet by mouth daily at 6 (six) AM.     cyanocobalamin (VITAMIN B12) 500 MCG tablet Take 500 mcg by mouth daily.     diclofenac Sodium (VOLTAREN) 1 % GEL Apply 1 Application topically 3 (three) times daily as needed (joint pain).     escitalopram (LEXAPRO) 10 MG tablet Take 10 mg by mouth every morning.     lisinopril  (ZESTRIL ) 20 MG tablet Take 20 mg by mouth daily.     lisinopril -hydrochlorothiazide  (ZESTORETIC ) 20-12.5 MG tablet Take 1 tablet by mouth daily. 90 tablet 1   Menthol, Topical Analgesic, (BIOFREEZE) 4 % GEL Apply 1 application  topically as needed.     metoprolol  succinate (TOPROL -XL) 100 MG 24 hr tablet Take 1 tablet (100 mg total) by mouth daily. 90 tablet 3   Multiple Vitamins-Minerals (WOMENS 50+ MULTI VITAMIN/MIN PO) Take 1 tablet by mouth daily.     NEOMYCIN -POLYMYXIN-HYDROCORTISONE (CORTISPORIN) 1 % SOLN OTIC solution Apply 1-2 drops to toe BID after soaking 10 mL 1   Omega-3 Fatty Acids (FISH OIL) 500 MG CAPS Take 500 mg by mouth daily. Taken at [employer] and in the Afternoon (3p)     rivaroxaban  (XARELTO ) 20 MG TABS tablet TAKE 1 TABLET BY MOUTH ONCE DAILY WITH SUPPER 90 tablet 1   rosuvastatin  (CRESTOR ) 20 MG tablet Take 1 tablet (20 mg total) by mouth daily. 90 tablet 3   valACYclovir (VALTREX) 1000 MG tablet Take 1,000 mg by mouth 2 (two) times daily as needed.     Vitamin D -Vitamin K (D3 + K2) 125-100 MCG CAPS Take 1 capsule by mouth daily at 6 (six) AM.     No current facility-administered medications on file prior to visit.   Allergies  Allergen Reactions   Vicodin [Hydrocodone-Acetaminophen ] Nausea Only   Atorvastatin  Other (See Comments)    Muscle/joint pain  atorvastatin    Social History   Socioeconomic History   Marital status: Married    Spouse name: Not on file   Number of children: Not on  file   Years of education: Not on file   Highest education level: Associate degree: occupational, scientist, product/process development, or vocational program  Occupational History   Not on file  Tobacco Use   Smoking status: Former    Current packs/day: 1.50    Average packs/day: 1.5 packs/day for 30.0 years (45.0 ttl pk-yrs)    Types: Cigarettes   Smokeless tobacco: Never  Vaping Use   Vaping status: Never Used  Substance and Sexual Activity   Alcohol use: Yes    Alcohol/week: 7.0 standard drinks of alcohol    Types: 7 Glasses of wine per week   Drug use: No   Sexual activity: Not Currently  Birth control/protection: Post-menopausal, Surgical    Comment: Tubal lig-1st intercourse 73 yo-Fewer than 5 partners  Other Topics Concern   Not on file  Social History Narrative   Not on file   Social Drivers of Health   Tobacco Use: Medium Risk (04/17/2024)   Patient History    Smoking Tobacco Use: Former    Smokeless Tobacco Use: Never    Passive Exposure: Not on file  Financial Resource Strain: Low Risk (06/04/2024)   Overall Financial Resource Strain (CARDIA)    Difficulty of Paying Living Expenses: Not hard at all  Food Insecurity: Unknown (06/04/2024)   Epic    Worried About Programme Researcher, Broadcasting/film/video in the Last Year: Never true    [employer] of Food in the Last Year: Not on file  Transportation Needs: No Transportation Needs (06/04/2024)   Epic    Lack of Transportation (Medical): No    Lack of Transportation (Non-Medical): No  Physical Activity: Inactive (06/04/2024)   Exercise Vital Sign    Days of Exercise per Week: 0 days    Minutes of Exercise per Session: Not on file  Stress: No Stress Concern Present (06/04/2024)   [employer] of Occupational Health - Occupational Stress Questionnaire    Feeling of Stress: Only a little  Social Connections: Moderately Integrated (06/04/2024)   Social Connection and Isolation Panel    Frequency of Communication with Friends and Family: Once a week     Frequency of Social Gatherings with Friends and Family: Once a week    Attends Religious Services: More than 4 times per year    Active Member of [employer] or Organizations: Yes    Attends Banker Meetings: Not on file    Marital Status: Married  Intimate Partner Violence: Not At Risk (03/22/2024)   Epic    Fear of Current or Ex-Partner: No    Emotionally Abused: No    Physically Abused: No    Sexually Abused: No  Depression (PHQ2-9): Low Risk (03/23/2024)   Depression (PHQ2-9)    PHQ-2 Score: 0  Alcohol Screen: Low Risk (06/04/2024)   Alcohol Screen    Last Alcohol Screening Score (AUDIT): 4  Housing: Low Risk (06/04/2024)   Epic    Unable to Pay for Housing in the Last Year: No    Number of Times Moved in the Last Year: 0    Homeless in the Last Year: No  Utilities: Not At Risk (03/22/2024)   Epic    Threatened with loss of utilities: No  Health Literacy: Adequate Health Literacy (03/22/2024)   B1300 Health Literacy    Frequency of need for help with medical instructions: Never   Family History  Problem Relation Age of Onset   Heart disease Mother    Diabetes Father    Colon cancer Maternal Aunt    Heart disease Paternal Grandfather      Review of Systems     Objective:   Physical Exam Vitals reviewed.  Constitutional:      Appearance: Normal appearance. She is normal weight.  HENT:     Right Ear: Tympanic membrane and ear canal normal.     Left Ear: Tympanic membrane and ear canal normal.     Nose: Congestion present.     Left Sinus: Maxillary sinus tenderness present.     Mouth/Throat:     Pharynx: No oropharyngeal exudate or posterior oropharyngeal erythema.  Eyes:     Extraocular Movements: Extraocular movements intact.     Pupils:  Pupils are equal, round, and reactive to light.  Neck:     Vascular: No carotid bruit.  Cardiovascular:     Rate and Rhythm: Normal rate and regular rhythm.     Heart sounds: Normal heart sounds. No murmur heard.     No gallop.  Pulmonary:     Effort: Pulmonary effort is normal. No respiratory distress.     Breath sounds: Normal breath sounds. No wheezing, rhonchi or rales.  Abdominal:     General: Bowel sounds are normal. There is no distension.     Palpations: Abdomen is soft. There is no mass.     Tenderness: There is no abdominal tenderness.  Musculoskeletal:     Right lower leg: No edema.     Left lower leg: No edema.  Lymphadenopathy:     Cervical: No cervical adenopathy.  Neurological:     General: No focal deficit present.     Mental Status: She is alert and oriented to person, place, and time. Mental status is at baseline.     Cranial Nerves: No cranial nerve deficit.     Motor: No weakness.     Coordination: Coordination normal.     Gait: Gait normal.  Psychiatric:        Mood and Affect: Mood normal.        Behavior: Behavior normal.          Assessment & Plan:   Acute bacterial rhinosinusitis Begin amoxicillin  875 mg twice daily for 10 days for sinus infection.  She can use guaifenesin /codeine  200/10/5 mL 1 teaspoon every 6 hours as needed for cough.

## 2024-06-05 NOTE — Telephone Encounter (Signed)
 Copied from CRM #[id number]. Topic: Clinical - Prescription Issue >> Jun 05, 2024 10:35 AM Olam RAMAN wrote: Reason for CRM: steve with [facility] and dont have the stregth that was req. Dosage req: 100 mg [facility] has 100 by 10 not 200 guaiFENesin -Codeine  200-10 MG/5ML LIQD

## 2024-06-06 ENCOUNTER — Other Ambulatory Visit: Payer: Self-pay | Admitting: Cardiovascular Disease

## 2024-06-06 DIAGNOSIS — I480 Paroxysmal atrial fibrillation: Secondary | ICD-10-CM

## 2024-06-06 NOTE — Telephone Encounter (Signed)
 Xarelto  20mg  refill request received. Pt is 73 years old, weight-61.1kg, Crea-0.92 on 01/25/24, last seen by Dr. Francyne on 01/12/24, Diagnosis-Afib, CrCl- 51.66 mL/min; Dose is appropriate based on dosing criteria. Will send in refill to requested pharmacy.

## 2024-06-18 ENCOUNTER — Encounter: Payer: Self-pay | Admitting: Family Medicine

## 2024-06-18 ENCOUNTER — Other Ambulatory Visit: Payer: Self-pay

## 2024-06-18 MED ORDER — LISINOPRIL 20 MG PO TABS: 20.0000 mg | ORAL_TABLET | Freq: Every day | ORAL | 1 refills | Status: AC

## 2024-06-25 ENCOUNTER — Other Ambulatory Visit

## 2024-07-05 ENCOUNTER — Other Ambulatory Visit

## 2024-07-05 DIAGNOSIS — I2510 Atherosclerotic heart disease of native coronary artery without angina pectoris: Secondary | ICD-10-CM

## 2024-07-05 DIAGNOSIS — E7800 Pure hypercholesterolemia, unspecified: Secondary | ICD-10-CM

## 2024-07-05 DIAGNOSIS — I10 Essential (primary) hypertension: Secondary | ICD-10-CM

## 2024-07-05 DIAGNOSIS — E0590 Thyrotoxicosis, unspecified without thyrotoxic crisis or storm: Secondary | ICD-10-CM

## 2024-07-06 ENCOUNTER — Ambulatory Visit: Payer: Self-pay | Admitting: Family Medicine

## 2024-07-06 LAB — COMPLETE METABOLIC PANEL WITHOUT GFR
AG Ratio: 1.8 (calc) (ref 1.0–2.5)
ALT: 22 U/L (ref 6–29)
AST: 22 U/L (ref 10–35)
Albumin: 4.4 g/dL (ref 3.6–5.1)
Alkaline phosphatase (APISO): 64 U/L (ref 37–153)
BUN: 14 mg/dL (ref 7–25)
CO2: 31 mmol/L (ref 20–32)
Calcium: 9.7 mg/dL (ref 8.6–10.4)
Chloride: 103 mmol/L (ref 98–110)
Creat: 0.77 mg/dL (ref 0.60–1.00)
Globulin: 2.4 g/dL (ref 1.9–3.7)
Glucose, Bld: 111 mg/dL — ABNORMAL HIGH (ref 65–99)
Potassium: 4.4 mmol/L (ref 3.5–5.3)
Sodium: 141 mmol/L (ref 135–146)
Total Bilirubin: 0.6 mg/dL (ref 0.2–1.2)
Total Protein: 6.8 g/dL (ref 6.1–8.1)

## 2024-07-06 LAB — CBC WITH DIFFERENTIAL/PLATELET
Absolute Lymphocytes: 1704 {cells}/uL (ref 850–3900)
Absolute Monocytes: 594 {cells}/uL (ref 200–950)
Basophils Absolute: 42 {cells}/uL (ref 0–200)
Basophils Relative: 0.7 %
Eosinophils Absolute: 282 {cells}/uL (ref 15–500)
Eosinophils Relative: 4.7 %
HCT: 41.6 % (ref 35.9–46.0)
Hemoglobin: 13.7 g/dL (ref 11.7–15.5)
MCH: 33.3 pg — ABNORMAL HIGH (ref 27.0–33.0)
MCHC: 32.9 g/dL (ref 31.6–35.4)
MCV: 101 fL (ref 81.4–101.7)
MPV: 10.2 fL (ref 7.5–12.5)
Monocytes Relative: 9.9 %
Neutro Abs: 3378 {cells}/uL (ref 1500–7800)
Neutrophils Relative %: 56.3 %
Platelets: 214 Thousand/uL (ref 140–400)
RBC: 4.12 Million/uL (ref 3.80–5.10)
RDW: 11.9 % (ref 11.0–15.0)
Total Lymphocyte: 28.4 %
WBC: 6 Thousand/uL (ref 3.8–10.8)

## 2024-07-06 LAB — TSH: TSH: 2.54 m[IU]/L (ref 0.40–4.50)

## 2024-07-06 LAB — LIPID PANEL
Cholesterol: 172 mg/dL
HDL: 61 mg/dL
LDL Cholesterol (Calc): 86 mg/dL
Non-HDL Cholesterol (Calc): 111 mg/dL
Total CHOL/HDL Ratio: 2.8 (calc)
Triglycerides: 153 mg/dL — ABNORMAL HIGH

## 2024-07-06 NOTE — CV Procedure (Signed)
" °  Device system confirmed to be MRI conditional, with implant date > 6 weeks ago, and no evidence of abandoned or epicardial leads in review of most recent CXR  Device last cleared by EP Provider: Prentice Passey 07/06/2024   Clearance is good through for 1 year as long as parameters remain stable at time of check. If pt undergoes a cardiac device procedure during that time, they should be re-cleared.   Tachy-therapies to be programmed off if applicable with device back to pre-MRI settings after completion of exam.  [employer] - Programming recommendation received through [employer] App/Tablet  Rocky Catalan, RT  07/06/2024 8:50 AM     "

## 2024-07-10 ENCOUNTER — Ambulatory Visit (HOSPITAL_COMMUNITY)
Admission: RE | Admit: 2024-07-10 | Discharge: 2024-07-10 | Disposition: A | Discharge status: Home / Self Care | Source: Ambulatory Visit | Attending: Sports Medicine | Admitting: Sports Medicine

## 2024-07-10 ENCOUNTER — Ambulatory Visit: Payer: Medicare HMO

## 2024-07-10 DIAGNOSIS — M25561 Pain in right knee: Secondary | ICD-10-CM | POA: Diagnosis present

## 2024-07-10 DIAGNOSIS — I442 Atrioventricular block, complete: Secondary | ICD-10-CM

## 2024-07-10 NOTE — Progress Notes (Signed)
 Patient was monitored by this RN during MRI scan due to presence of a pacemaker. Cardiac rhythm was continuously monitored throughout the procedure. Prior to the start of the scan, the pacemaker was placed in MRI-safe mode by the MRI technician. Following the completion of the scan, the device was returned to its pre-MRI settings. Neurological status and orientation post-procedure were unchanged from baseline.   Pre-procedure Heart Rate (Prior to being placed in MRI safe mode): 68-69 BPM   Post-procedure Heart Rate (Once pacemaker is returned to baseline mode): 70bpm

## 2024-07-11 LAB — CUP PACEART REMOTE DEVICE CHECK
Battery Remaining Longevity: 130 mo
Battery Voltage: 3.01 V
Brady Statistic AP VP Percent: 6.72 %
Brady Statistic AP VS Percent: 0 %
Brady Statistic AS VP Percent: 93.09 %
Brady Statistic AS VS Percent: 0.19 %
Brady Statistic RA Percent Paced: 6.72 %
Brady Statistic RV Percent Paced: 99.81 %
Date Time Interrogation Session: 20260120040337
Implantable Lead Connection Status: 753985
Implantable Lead Connection Status: 753985
Implantable Lead Implant Date: 20110208
Implantable Lead Implant Date: 20110208
Implantable Lead Location: 753859
Implantable Lead Location: 753860
Implantable Lead Model: 5076
Implantable Lead Model: 5076
Implantable Pulse Generator Implant Date: 20240418
Lead Channel Impedance Value: 342 Ohm
Lead Channel Impedance Value: 380 Ohm
Lead Channel Impedance Value: 380 Ohm
Lead Channel Impedance Value: 456 Ohm
Lead Channel Pacing Threshold Amplitude: 0.5 V
Lead Channel Pacing Threshold Amplitude: 0.75 V
Lead Channel Pacing Threshold Pulse Width: 0.4 ms
Lead Channel Pacing Threshold Pulse Width: 0.4 ms
Lead Channel Sensing Intrinsic Amplitude: 2.125 mV
Lead Channel Sensing Intrinsic Amplitude: 2.125 mV
Lead Channel Setting Pacing Amplitude: 1.5 V
Lead Channel Setting Pacing Amplitude: 2 V
Lead Channel Setting Pacing Pulse Width: 0.4 ms
Lead Channel Setting Sensing Sensitivity: 1.2 mV
Zone Setting Status: 755011

## 2024-07-13 NOTE — Progress Notes (Signed)
 Remote PPM Transmission

## 2024-07-15 ENCOUNTER — Ambulatory Visit: Payer: Self-pay | Admitting: Cardiovascular Disease

## 2025-01-29 ENCOUNTER — Encounter: Admitting: Family Medicine

## 2025-03-28 ENCOUNTER — Ambulatory Visit
